# Patient Record
Sex: Female | Born: 1960 | Race: White | Hispanic: No | Smoking: Former smoker
Health system: Southern US, Community
[De-identification: ages and names within clinical notes are randomized; demographics above are authoritative.]

## PROBLEM LIST (undated history)

## (undated) DIAGNOSIS — D369 Benign neoplasm, unspecified site: Secondary | ICD-10-CM

## (undated) DIAGNOSIS — T7840XA Allergy, unspecified, initial encounter: Secondary | ICD-10-CM

## (undated) DIAGNOSIS — J449 Chronic obstructive pulmonary disease, unspecified: Secondary | ICD-10-CM

## (undated) DIAGNOSIS — J439 Emphysema, unspecified: Secondary | ICD-10-CM

## (undated) DIAGNOSIS — E042 Nontoxic multinodular goiter: Secondary | ICD-10-CM

## (undated) DIAGNOSIS — K635 Polyp of colon: Secondary | ICD-10-CM

## (undated) DIAGNOSIS — D329 Benign neoplasm of meninges, unspecified: Secondary | ICD-10-CM

## (undated) DIAGNOSIS — K219 Gastro-esophageal reflux disease without esophagitis: Secondary | ICD-10-CM

## (undated) DIAGNOSIS — R269 Unspecified abnormalities of gait and mobility: Secondary | ICD-10-CM

## (undated) DIAGNOSIS — E785 Hyperlipidemia, unspecified: Secondary | ICD-10-CM

## (undated) DIAGNOSIS — E0590 Thyrotoxicosis, unspecified without thyrotoxic crisis or storm: Secondary | ICD-10-CM

## (undated) DIAGNOSIS — H548 Legal blindness, as defined in USA: Secondary | ICD-10-CM

## (undated) DIAGNOSIS — I10 Essential (primary) hypertension: Secondary | ICD-10-CM

## (undated) DIAGNOSIS — E119 Type 2 diabetes mellitus without complications: Secondary | ICD-10-CM

## (undated) DIAGNOSIS — F17200 Nicotine dependence, unspecified, uncomplicated: Secondary | ICD-10-CM

## (undated) HISTORY — DX: Polyp of colon: K63.5

## (undated) HISTORY — DX: Unspecified abnormalities of gait and mobility: R26.9

## (undated) HISTORY — DX: Legal blindness, as defined in USA: H54.8

## (undated) HISTORY — DX: Chronic obstructive pulmonary disease, unspecified: J44.9

## (undated) HISTORY — DX: Gastro-esophageal reflux disease without esophagitis: K21.9

## (undated) HISTORY — DX: Nontoxic multinodular goiter: E04.2

## (undated) HISTORY — DX: Nicotine dependence, unspecified, uncomplicated: F17.200

## (undated) HISTORY — DX: Benign neoplasm of meninges, unspecified: D32.9

## (undated) HISTORY — DX: Emphysema, unspecified: J43.9

## (undated) HISTORY — DX: Benign neoplasm, unspecified site: D36.9

## (undated) HISTORY — DX: Thyrotoxicosis, unspecified without thyrotoxic crisis or storm: E05.90

## (undated) HISTORY — DX: Allergy, unspecified, initial encounter: T78.40XA

## (undated) HISTORY — DX: Type 2 diabetes mellitus without complications: E11.9

## (undated) HISTORY — DX: Hyperlipidemia, unspecified: E78.5

## (undated) HISTORY — DX: Essential (primary) hypertension: I10

---

## 1997-11-22 HISTORY — PX: ABDOMINAL HYSTERECTOMY: SHX81

## 1999-11-23 HISTORY — PX: INNER EAR SURGERY: SHX679

## 2001-04-18 ENCOUNTER — Ambulatory Visit (HOSPITAL_BASED_OUTPATIENT_CLINIC_OR_DEPARTMENT_OTHER): Admission: RE | Admit: 2001-04-18 | Discharge: 2001-04-19 | Payer: Self-pay | Admitting: *Deleted

## 2001-04-18 ENCOUNTER — Encounter (INDEPENDENT_AMBULATORY_CARE_PROVIDER_SITE_OTHER): Payer: Self-pay | Admitting: Specialist

## 2005-10-15 ENCOUNTER — Ambulatory Visit: Payer: Self-pay | Admitting: Internal Medicine

## 2005-11-22 HISTORY — PX: BREAST BIOPSY: SHX20

## 2006-04-06 ENCOUNTER — Ambulatory Visit: Payer: Self-pay | Admitting: Internal Medicine

## 2006-11-23 ENCOUNTER — Ambulatory Visit: Payer: Self-pay | Admitting: Internal Medicine

## 2007-01-16 ENCOUNTER — Ambulatory Visit: Payer: Self-pay | Admitting: Internal Medicine

## 2007-03-07 ENCOUNTER — Ambulatory Visit: Payer: Self-pay | Admitting: Internal Medicine

## 2007-11-23 DIAGNOSIS — D329 Benign neoplasm of meninges, unspecified: Secondary | ICD-10-CM

## 2007-11-23 DIAGNOSIS — H548 Legal blindness, as defined in USA: Secondary | ICD-10-CM

## 2007-11-23 HISTORY — DX: Legal blindness, as defined in USA: H54.8

## 2007-11-23 HISTORY — PX: BRAIN SURGERY: SHX531

## 2007-11-23 HISTORY — DX: Benign neoplasm of meninges, unspecified: D32.9

## 2008-03-14 ENCOUNTER — Ambulatory Visit: Payer: Self-pay | Admitting: Family Medicine

## 2009-01-01 ENCOUNTER — Ambulatory Visit: Payer: Self-pay | Admitting: Internal Medicine

## 2010-08-20 ENCOUNTER — Emergency Department (HOSPITAL_COMMUNITY): Admission: EM | Admit: 2010-08-20 | Discharge: 2010-08-20 | Payer: Self-pay | Admitting: Emergency Medicine

## 2011-02-04 LAB — DIFFERENTIAL
Basophils Absolute: 0 10*3/uL (ref 0.0–0.1)
Basophils Relative: 0 % (ref 0–1)
Eosinophils Absolute: 0.2 10*3/uL (ref 0.0–0.7)
Monocytes Absolute: 0.7 10*3/uL (ref 0.1–1.0)
Monocytes Relative: 7 % (ref 3–12)
Neutro Abs: 6.1 10*3/uL (ref 1.7–7.7)

## 2011-02-04 LAB — COMPREHENSIVE METABOLIC PANEL
ALT: 26 U/L (ref 0–35)
Albumin: 3.7 g/dL (ref 3.5–5.2)
Alkaline Phosphatase: 75 U/L (ref 39–117)
BUN: 8 mg/dL (ref 6–23)
Chloride: 107 mEq/L (ref 96–112)
Glucose, Bld: 101 mg/dL — ABNORMAL HIGH (ref 70–99)
Potassium: 3.2 mEq/L — ABNORMAL LOW (ref 3.5–5.1)
Sodium: 140 mEq/L (ref 135–145)
Total Bilirubin: 0.4 mg/dL (ref 0.3–1.2)

## 2011-02-04 LAB — POCT CARDIAC MARKERS
CKMB, poc: <1 ng/mL — ABNORMAL LOW (ref 1.0–8.0)
Myoglobin, poc: 32.1 ng/mL (ref 12–200)
Myoglobin, poc: 44.2 ng/mL (ref 12–200)
Troponin i, poc: <0.05 ng/mL (ref 0.00–0.09)

## 2011-02-04 LAB — CBC
HCT: 43.1 % (ref 36.0–46.0)
MCV: 90.5 fL (ref 78.0–100.0)
Platelets: 237 10*3/uL (ref 150–400)
RBC: 4.76 MIL/uL (ref 3.87–5.11)
WBC: 9.7 10*3/uL (ref 4.0–10.5)

## 2011-04-09 NOTE — Op Note (Signed)
[place]. [facility]  Patient:    Yolanda Gallegos, Yolanda Gallegos                          MRN: [id number] Proc. Date: 04/18/01 Adm. Date:  [id number] Attending:  Aundria Mems                           Operative Report  PREOPERATIVE DIAGNOSIS:  Chronic otosclerosis, right ear.  POSTOPERATIVE DIAGNOSIS:  Chronic otosclerosis, right ear.  OPERATION PERFORMED:  Stapedectomy, right ear with insertion of 5.0 mm fat wire prosthesis.  SURGEON:  Kathy Breach, M.D.  ANESTHESIA:  General orotracheal.  DESCRIPTION OF PROCEDURE:  With the patient under general orotracheal anesthesia, the right ear was prepped and draped in sterile fashion.  Canal skin was infiltrated with 1% Xylocaine with 1:100,000 epinephrine for vasoconstriction.  Examination of tympanic membrane revealed somewhat foreshortened retracted position of the long process of the malleus. Manipulation of the long process of the malleus revealed that it seemed to be normally mobile.  There was mild tympanosclerotic changes of the tympanic membrane which was a little bit on the atrophic side and bulging outwards slightly with positive pressure ventilation anesthesia.  Tympanotomy flap was incised and elevated entering the middle ear space.  There was prominent Henle spine and good overhang of the posterior superior canal wall.  The chorda tympany was dissected free and preserved as the posterior superior bony canal wall was curetted back to allow adequate visualization of the oval window area.  The facial nerve superior to the oval window area was inspected and bony and covered and normal appearance.  Manipulation of the long process of the malleus showed normal mobility of the incus long process but fixed stapes. There was an adhesion from the undersurface of the tympanic membrane to the lenticular process.  This was sharply excised.  The incudostapedial joint was separated and the stapedius tendon severed.  The  measurement from the foot plate to the incus was 5 mm.  The  anterior crus area was obliterated with heavy otosclerotic change.  The posterior two thirds of the foot plate barely visible, appeared to blue.  A stapes superstructure was then downfractured and carefully removed having a small segment of the posterior third of the foot plate coming with it.  The oval window was occluded with Gelfoam soaked with saline.  Stab incision was made in the posterior inferior margin of the earlobe, fat retrieved for construction of two 5 mm fat wire prostheses which was completed.  Attention was then returned to the ear.  Gelfoam pledget was removed with 45 degree third mm cross picks anterior portion of the foot plate was removed. The anterior one third was obliterated, was not readily removable and therefore, left in place.  with the posterior two thirds of the oval window opened, the 5 mm fat wire prosthesis was inserted in position, looped over the long process of the incus and crimped in place.  Manipulation of the long process of the malleus showed good mobility of the reconstructed ossicular chain.  Tympanotomy flap turned back into position and stabilized back in the external canal with Gelfoam soaked with saline.  Sterile cotton placed in the external meatus.  The patient tolerated the procedure well and was taken to the recovery room in stable general condition. DD:  04/18/01 TD:  04/18/01 Job: [id number]

## 2011-10-25 ENCOUNTER — Ambulatory Visit: Payer: Self-pay | Admitting: Family Medicine

## 2013-04-11 ENCOUNTER — Other Ambulatory Visit: Payer: Self-pay | Admitting: Family Medicine

## 2013-04-11 NOTE — Telephone Encounter (Signed)
Med rf °

## 2013-04-12 ENCOUNTER — Other Ambulatory Visit: Payer: Self-pay | Admitting: Family Medicine

## 2013-05-10 ENCOUNTER — Telehealth: Payer: Self-pay | Admitting: Family Medicine

## 2013-05-11 MED ORDER — LOSARTAN POTASSIUM 100 MG PO TABS: 100.0000 mg | ORAL_TABLET | Freq: Every day | ORAL | Status: DC

## 2013-05-11 NOTE — Telephone Encounter (Signed)
Rx Refilled  

## 2013-06-28 ENCOUNTER — Telehealth: Payer: Self-pay | Admitting: Family Medicine

## 2013-06-28 MED ORDER — HYDROCHLOROTHIAZIDE 25 MG PO TABS: 25.0000 mg | ORAL_TABLET | Freq: Every day | ORAL | Status: DC

## 2013-06-28 NOTE — Telephone Encounter (Signed)
Rx Refilled  

## 2013-07-13 ENCOUNTER — Encounter: Payer: Self-pay | Admitting: Family Medicine

## 2013-07-13 ENCOUNTER — Ambulatory Visit (INDEPENDENT_AMBULATORY_CARE_PROVIDER_SITE_OTHER): Payer: Medicare Other | Admitting: Family Medicine

## 2013-07-13 VITALS — BP 120/86 | HR 100 | Temp 98.3°F | Resp 20 | Wt 260.0 lb

## 2013-07-13 DIAGNOSIS — E785 Hyperlipidemia, unspecified: Secondary | ICD-10-CM | POA: Insufficient documentation

## 2013-07-13 DIAGNOSIS — I10 Essential (primary) hypertension: Secondary | ICD-10-CM | POA: Insufficient documentation

## 2013-07-13 DIAGNOSIS — J019 Acute sinusitis, unspecified: Secondary | ICD-10-CM

## 2013-07-13 DIAGNOSIS — F17200 Nicotine dependence, unspecified, uncomplicated: Secondary | ICD-10-CM | POA: Insufficient documentation

## 2013-07-13 DIAGNOSIS — J449 Chronic obstructive pulmonary disease, unspecified: Secondary | ICD-10-CM | POA: Insufficient documentation

## 2013-07-13 MED ORDER — FLUCONAZOLE 150 MG PO TABS: 150.0000 mg | ORAL_TABLET | Freq: Once | ORAL | Status: DC

## 2013-07-13 MED ORDER — AMOXICILLIN-POT CLAVULANATE 875-125 MG PO TABS: 1.0000 | ORAL_TABLET | Freq: Two times a day (BID) | ORAL | Status: DC

## 2013-07-13 NOTE — Progress Notes (Signed)
Subjective:    Patient ID: Yolanda Gallegos, female    DOB: [birth year], 52 y.o.   MRN: [id number]  HPI Patient reports 2 weeks of constant pain in the right greater than the left maxillary sinus, postnasal drip, sinus headache, congestion, and fevers. She also reports fullness and pain in both ears. She denies sore throat. She denies cough. She denies nausea or vomiting.  She also has hypertension and hyperlipidemia. Her blood pressures well controlled. She is overdue to check her cholesterol and she is fasting today. She denies chest pain, shortness of breath, dyspnea on exertion, myalgia, or right upper quadrant pain. Past Medical History  Diagnosis Date  . Hypertension   . Hyperlipidemia   . COPD (chronic obstructive pulmonary disease)   . Smoker   . Meningioma    Past Surgical History  Procedure Laterality Date  . 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 Abdominal hysterectomy      left oopherectomy   Current Outpatient Prescriptions on File Prior to Visit  Medication Sig Dispense Refill  . atorvastatin (LIPITOR) 40 MG tablet TAKE 1 TABLET BY MOUTH EVERY EVENING AT BEDTIME  30 tablet  3  . hydrochlorothiazide (HYDRODIURIL) 25 MG tablet Take 1 tablet (25 mg total) by mouth daily.  30 tablet  5  . losartan (COZAAR) 100 MG tablet Take 1 tablet (100 mg total) by mouth daily.  30 tablet  3  . omeprazole (PRILOSEC) 20 MG capsule TAKE 2 CAPSULES BY MOUTH DAILY  60 capsule  11   No current facility-administered medications on file prior to visit.   No Known Allergies History   Social History  . Marital Status: Divorced    Spouse Name: N/A    Number of Children: N/A  . Years of Education: N/A   Occupational History  . Not on file.   Social History Main Topics  . Smoking status: Former Games developer  . Smokeless tobacco: Former Neurosurgeon    Quit date: 01/21/2012  . Alcohol Use: No  . Drug Use: No  . Sexual Activity: Not on file   Other Topics Concern  . Not  on file   Social History Narrative  . No narrative on file      Review of Systems  All other systems reviewed and are negative.       Objective:   Physical Exam  Vitals reviewed. HENT:  Right Ear: External ear normal.  Left Ear: External ear normal.  Nose: Right sinus exhibits maxillary sinus tenderness. Right sinus exhibits no frontal sinus tenderness. Left sinus exhibits maxillary sinus tenderness. Left sinus exhibits no frontal sinus tenderness.  Mouth/Throat: Oropharynx is clear and moist. No oropharyngeal exudate.  Neck: Neck supple. No thyromegaly present.  Cardiovascular: Normal rate, regular rhythm, normal heart sounds and intact distal pulses.   No murmur heard. Pulmonary/Chest: Effort normal and breath sounds normal. No respiratory distress. She has no wheezes. She has no rales.  Abdominal: Soft. Bowel sounds are normal.  Lymphadenopathy:    She has no cervical adenopathy.          Assessment & Plan:  1. Acute rhinosinusitis - amoxicillin-clavulanate (AUGMENTIN) 875-125 MG per tablet; Take 1 tablet by mouth 2 (two) times daily.  Dispense: 20 tablet; Refill: 0  I also sent a prescription for Diflucan to her pharmacy in case she develops a secondary yeast infection.  2. HLD (hyperlipidemia) Blood pressures well controlled. Continue losartan and hydrochlorothiazide. Check fasting lipid panel. Goal  LDL is less than 100 given the fact she is a smoker. - COMPLETE METABOLIC PANEL WITH GFR - Lipid panel

## 2013-07-14 LAB — COMPLETE METABOLIC PANEL WITH GFR
ALT: 30 U/L (ref 0–35)
AST: 24 U/L (ref 0–37)
Albumin: 4.1 g/dL (ref 3.5–5.2)
Alkaline Phosphatase: 81 U/L (ref 39–117)
GFR, Est Non African American: >89 mL/min
Glucose, Bld: 90 mg/dL (ref 70–99)
Potassium: 3 mEq/L — ABNORMAL LOW (ref 3.5–5.3)
Sodium: 141 mEq/L (ref 135–145)
Total Bilirubin: 0.6 mg/dL (ref 0.3–1.2)
Total Protein: 7.1 g/dL (ref 6.0–8.3)

## 2013-07-14 LAB — LIPID PANEL
HDL: 40 mg/dL (ref >39)
LDL Cholesterol: 107 mg/dL — ABNORMAL HIGH (ref 0–99)
Total CHOL/HDL Ratio: 4.3 Ratio
VLDL: 25 mg/dL (ref 0–40)

## 2013-07-26 ENCOUNTER — Telehealth: Payer: Self-pay | Admitting: Family Medicine

## 2013-07-26 MED ORDER — HYDROCHLOROTHIAZIDE 25 MG PO TABS: 25.0000 mg | ORAL_TABLET | Freq: Every day | ORAL | Status: DC

## 2013-07-26 MED ORDER — LOSARTAN POTASSIUM 100 MG PO TABS: 100.0000 mg | ORAL_TABLET | Freq: Every day | ORAL | Status: DC

## 2013-07-26 MED ORDER — OMEPRAZOLE 20 MG PO CPDR: DELAYED_RELEASE_CAPSULE | ORAL | Status: DC

## 2013-07-26 MED ORDER — ATORVASTATIN CALCIUM 40 MG PO TABS: ORAL_TABLET | ORAL | Status: DC

## 2013-07-26 NOTE — Telephone Encounter (Signed)
HCTZ 25 mg tab 1 QD #90 Atorvastatin 40 mg tab 1 QHS #90 Losartan 100 mg tab 1 QD #90 Omeprazole DR 20 mg cap 2 QD #180 Fluticasone Prop 50 mcg spray (90 day supply)

## 2013-07-26 NOTE — Telephone Encounter (Signed)
Meds refilled.

## 2013-07-27 ENCOUNTER — Telehealth: Payer: Self-pay | Admitting: Family Medicine

## 2013-07-27 MED ORDER — LOSARTAN POTASSIUM 100 MG PO TABS: 100.0000 mg | ORAL_TABLET | Freq: Every day | ORAL | Status: DC

## 2013-07-27 NOTE — Telephone Encounter (Signed)
Rx Refilled  

## 2013-07-27 NOTE — Telephone Encounter (Signed)
Losartan Potassium 100 mg tab 1 QD #90

## 2013-09-27 ENCOUNTER — Other Ambulatory Visit: Payer: Self-pay

## 2013-09-28 ENCOUNTER — Encounter: Payer: Self-pay | Admitting: Family Medicine

## 2013-09-28 ENCOUNTER — Other Ambulatory Visit: Payer: Self-pay | Admitting: Family Medicine

## 2013-09-28 ENCOUNTER — Ambulatory Visit (INDEPENDENT_AMBULATORY_CARE_PROVIDER_SITE_OTHER): Payer: Medicare Other | Admitting: Family Medicine

## 2013-09-28 VITALS — BP 122/84 | HR 78 | Temp 97.6°F | Resp 18 | Ht 66.0 in | Wt 270.0 lb

## 2013-09-28 DIAGNOSIS — Z1231 Encounter for screening mammogram for malignant neoplasm of breast: Secondary | ICD-10-CM

## 2013-09-28 DIAGNOSIS — Z23 Encounter for immunization: Secondary | ICD-10-CM

## 2013-09-28 DIAGNOSIS — Z0000 Encounter for general adult medical examination without abnormal findings: Secondary | ICD-10-CM

## 2013-09-28 LAB — LIPID PANEL
HDL: 40 mg/dL (ref >39)
LDL Cholesterol: 102 mg/dL — ABNORMAL HIGH (ref 0–99)
Triglycerides: 173 mg/dL — ABNORMAL HIGH (ref <150)
VLDL: 35 mg/dL (ref 0–40)

## 2013-09-28 LAB — TSH: TSH: 0.063 u[IU]/mL — ABNORMAL LOW (ref 0.350–4.500)

## 2013-09-28 LAB — CBC WITH DIFFERENTIAL/PLATELET
Eosinophils Relative: 2 % (ref 0–5)
HCT: 40.4 % (ref 36.0–46.0)
Hemoglobin: 13.7 g/dL (ref 12.0–15.0)
Lymphocytes Relative: 38 % (ref 12–46)
MCHC: 33.9 g/dL (ref 30.0–36.0)
MCV: 86 fL (ref 78.0–100.0)
Monocytes Absolute: 0.7 10*3/uL (ref 0.1–1.0)
Monocytes Relative: 8 % (ref 3–12)
Neutro Abs: 4.3 10*3/uL (ref 1.7–7.7)

## 2013-09-28 LAB — COMPLETE METABOLIC PANEL WITH GFR
AST: 20 U/L (ref 0–37)
Albumin: 4.1 g/dL (ref 3.5–5.2)
Alkaline Phosphatase: 87 U/L (ref 39–117)
GFR, Est Non African American: >89 mL/min
Glucose, Bld: 111 mg/dL — ABNORMAL HIGH (ref 70–99)
Potassium: 4 mEq/L (ref 3.5–5.3)
Sodium: 142 mEq/L (ref 135–145)
Total Bilirubin: 0.6 mg/dL (ref 0.3–1.2)
Total Protein: 7.1 g/dL (ref 6.0–8.3)

## 2013-09-28 NOTE — Progress Notes (Signed)
Subjective:    Patient ID: Yolanda Gallegos, female    DOB: 03/03/1961, 52 y.o.   MRN: [id number]  HPI  The patient is here for complete physical exam. She quit smoking in 2013. Her last mammogram was in 2012. She is having Pneumovax 23 but is due for Prevnar 13. She is due for her flu shot. She is due for a colonoscopy. She hasn't history of hysterectomy and therefore does not require a Pap smear.  Was low this summer. She is taking hydrochlorothiazide for hypertension. Her blood pressures well controlled today. She reports a cough for last 2 weeks but is gradually improving. She denies any fevers or chills or chest pain or hemoptysis. Past Medical History  Diagnosis Date  . Hypertension   . Hyperlipidemia   . COPD (chronic obstructive pulmonary disease)   . Smoker   . Meningioma    Current Outpatient Prescriptions on File Prior to Visit  Medication Sig Dispense Refill  . atorvastatin (LIPITOR) 40 MG tablet TAKE 1 TABLET BY MOUTH EVERY EVENING AT BEDTIME  30 tablet  3  . hydrochlorothiazide (HYDRODIURIL) 25 MG tablet Take 1 tablet (25 mg total) by mouth daily.  30 tablet  5  . losartan (COZAAR) 100 MG tablet Take 1 tablet (100 mg total) by mouth daily.  90 tablet  1  . omeprazole (PRILOSEC) 20 MG capsule TAKE 2 CAPSULES BY MOUTH DAILY  60 capsule  11   No current facility-administered medications on file prior to visit.   No Known Allergies History   Social History  . Marital Status: Divorced    Spouse Name: N/A    Number of Children: N/A  . Years of Education: N/A   Occupational History  . Not on file.   Social History Main Topics  . Smoking status: Former Games developer  . Smokeless tobacco: Former Neurosurgeon    Quit date: 01/21/2012  . Alcohol Use: No  . Drug Use: No  . Sexual Activity: Not on file     Comment: married, son is battling leukemia.   Other Topics Concern  . Not on file   Social History Narrative  . No narrative on file   Family History  Problem Relation Age of Onset   . Hypertension Mother   . Cancer Mother     cervical  . Cancer Father     leukemia  . Hyperlipidemia Sister   . Heart disease Brother   . Hyperlipidemia Brother      Review of Systems  All other systems reviewed and are negative.       Objective:   Physical Exam  Vitals reviewed. Constitutional: She is oriented to person, place, and time. She appears well-developed and well-nourished. No distress.  HENT:  Head: Normocephalic and atraumatic.  Right Ear: External ear normal.  Left Ear: External ear normal.  Nose: Nose normal.  Mouth/Throat: Oropharynx is clear and moist. No oropharyngeal exudate.  Eyes: Conjunctivae and EOM are normal. Pupils are equal, round, and reactive to light. Right eye exhibits no discharge. Left eye exhibits no discharge. No scleral icterus.  Neck: Normal range of motion. Neck supple. No JVD present. No tracheal deviation present. No thyromegaly present.  Cardiovascular: Normal rate, regular rhythm, normal heart sounds and intact distal pulses.  Exam reveals no gallop and no friction rub.   No murmur heard. Pulmonary/Chest: Effort normal and breath sounds normal. No respiratory distress. She has no wheezes. She has no rales. She exhibits no tenderness.  Abdominal: Soft. Bowel sounds are  normal. She exhibits no distension and no mass. There is no tenderness. There is no rebound and no guarding.  Musculoskeletal: Normal range of motion. She exhibits no edema and no tenderness.  Lymphadenopathy:    She has no cervical adenopathy.  Neurological: She is alert and oriented to person, place, and time. She has normal reflexes. She displays normal reflexes. No cranial nerve deficit. She exhibits normal muscle tone. Coordination normal.  Skin: Skin is warm. No rash noted. She is not diaphoretic. No erythema. No pallor.  Psychiatric: She has a normal mood and affect. Her behavior is normal. Judgment and thought content normal.          Assessment & Plan:   1. Routine general medical examination at a health care facility Check a CMP, CBC, fasting lipid panel, and TSH. Potassium remains low, I would discontinue hydrochlorothiazide and switch the patient to amlodipine 10 mg by mouth daily. Also scheduled patient for mammogram as well as a colonoscopy. The remainder of her exam is normal. I recommended daily aerobic exercise. I will give the patient flu shot as well as a pneumonia vaccine. I anticipate the cough is due to bronchitis. It should improve over the next week. If it does not improve I would recommend a chest x-ray. - COMPLETE METABOLIC PANEL WITH GFR - CBC with Differential - Lipid panel - TSH - MM Digital Screening; Future - Ambulatory referral to Gastroenterology  2. Need for prophylactic vaccination and inoculation against unspecified single disease - Flu Vaccine QUAD 36+ mos IM - Pneumococcal conjugate vaccine 13-valent

## 2013-10-03 ENCOUNTER — Encounter: Payer: Self-pay | Admitting: Internal Medicine

## 2013-10-05 ENCOUNTER — Ambulatory Visit
Admission: RE | Admit: 2013-10-05 | Discharge: 2013-10-05 | Disposition: A | Discharge status: Home / Self Care | Payer: Medicare Other | Source: Ambulatory Visit | Attending: Family Medicine | Admitting: Family Medicine

## 2013-10-05 DIAGNOSIS — Z1231 Encounter for screening mammogram for malignant neoplasm of breast: Secondary | ICD-10-CM

## 2013-11-20 ENCOUNTER — Telehealth: Payer: Self-pay | Admitting: Family Medicine

## 2013-11-20 NOTE — Telephone Encounter (Signed)
Try lotrisone bid for 10 days.  NTBS if worse.

## 2013-11-20 NOTE — Telephone Encounter (Signed)
Pt has had a rash under breast,on belly, and creases on her legs she has had this for couple of months and it is red and itchy she said she is wanting to know if something can be called in for it  Pharmacy [facility]  Call back number is [phone]

## 2013-11-21 ENCOUNTER — Other Ambulatory Visit: Payer: Self-pay | Admitting: Family Medicine

## 2013-11-21 MED ORDER — CLOTRIMAZOLE-BETAMETHASONE 1-0.05 % EX CREA: 1.0000 "application " | TOPICAL_CREAM | Freq: Two times a day (BID) | CUTANEOUS | Status: DC

## 2013-11-21 NOTE — Telephone Encounter (Signed)
Patient aware and med sent to pharm 

## 2013-11-22 DIAGNOSIS — D369 Benign neoplasm, unspecified site: Secondary | ICD-10-CM

## 2013-11-22 DIAGNOSIS — K635 Polyp of colon: Secondary | ICD-10-CM

## 2013-11-22 HISTORY — DX: Polyp of colon: K63.5

## 2013-11-22 HISTORY — DX: Benign neoplasm, unspecified site: D36.9

## 2013-11-30 ENCOUNTER — Ambulatory Visit (AMBULATORY_SURGERY_CENTER): Payer: Medicare Other | Admitting: *Deleted

## 2013-11-30 VITALS — Ht 66.0 in | Wt 267.6 lb

## 2013-11-30 DIAGNOSIS — Z1211 Encounter for screening for malignant neoplasm of colon: Secondary | ICD-10-CM

## 2013-11-30 MED ORDER — MOVIPREP 100 G PO SOLR: ORAL | Status: DC

## 2013-11-30 NOTE — Progress Notes (Signed)
No allergies to eggs or soy. No problems with anesthesia.  

## 2013-12-04 ENCOUNTER — Encounter: Payer: Self-pay | Admitting: Internal Medicine

## 2013-12-06 ENCOUNTER — Telehealth: Payer: Self-pay | Admitting: Internal Medicine

## 2013-12-06 NOTE — Telephone Encounter (Signed)
Spoke with Yolanda Gallegos and states pharmacy will fill if we call in a formulary.  I told her that Moviprep is the only prep that Dr. Hilarie Fredrickson like to use and prep voucher mailed to Yolanda Gallegos

## 2013-12-10 ENCOUNTER — Telehealth: Payer: Self-pay | Admitting: Gastroenterology

## 2013-12-12 ENCOUNTER — Other Ambulatory Visit: Payer: Self-pay | Admitting: Family Medicine

## 2013-12-12 ENCOUNTER — Telehealth: Payer: Self-pay | Admitting: Internal Medicine

## 2013-12-12 MED ORDER — OMEPRAZOLE 40 MG PO CPDR: 40.0000 mg | DELAYED_RELEASE_CAPSULE | Freq: Every day | ORAL | Status: DC

## 2013-12-12 NOTE — Telephone Encounter (Signed)
Called pharmacy gave them free voucher info over the phone. It was accepted. Called pt to tell her. Pt verbalized understanding

## 2013-12-14 ENCOUNTER — Ambulatory Visit (AMBULATORY_SURGERY_CENTER): Payer: Medicare Other | Admitting: Internal Medicine

## 2013-12-14 ENCOUNTER — Encounter: Payer: Self-pay | Admitting: Internal Medicine

## 2013-12-14 VITALS — BP 130/83 | HR 90 | Temp 98.4°F | Resp 19 | Ht 66.0 in | Wt 267.0 lb

## 2013-12-14 DIAGNOSIS — Z1211 Encounter for screening for malignant neoplasm of colon: Secondary | ICD-10-CM

## 2013-12-14 DIAGNOSIS — D126 Benign neoplasm of colon, unspecified: Secondary | ICD-10-CM

## 2013-12-14 MED ORDER — SODIUM CHLORIDE 0.9 % IV SOLN: 500.0000 mL | INTRAVENOUS | Status: DC

## 2013-12-14 NOTE — Patient Instructions (Signed)

## 2013-12-14 NOTE — Progress Notes (Signed)
Procedure ends, to recovery, report given and VSS. 

## 2013-12-14 NOTE — Op Note (Signed)
[place]  [address]. [place], [id number]   COLONOSCOPY PROCEDURE REPORT  PATIENT: Yolanda Gallegos, Yolanda Gallegos  MR#: [id number] BIRTHDATE: 09-Jul-1961 , 52  yrs. old GENDER: Female ENDOSCOPIST: Jerene Bears, MD REFERRED VO:[id number] Dennard Schaumann, M.D. PROCEDURE DATE:  12/14/2013 PROCEDURE:   Submucosal injection, any substance and Colonoscopy with snare polypectomy First Screening Colonoscopy - Avg.  risk and is 50 yrs.  old or older Yes.  Prior Negative Screening - Now for repeat screening. N/A  History of Adenoma - Now for follow-up colonoscopy & has been > or = to 3 yrs.  N/A  Polyps Removed Today? Yes. ASA CLASS:   Class III INDICATIONS:average risk screening and first colonoscopy. MEDICATIONS: MAC sedation, administered by CRNA, Propofol (Diprivan), and Propofol (Diprivan) 1100 mg IV  DESCRIPTION OF PROCEDURE:   After the risks benefits and alternatives of the procedure were thoroughly explained, informed consent was obtained.  A digital rectal exam revealed external hemorrhoids.   The LB [id number]  endoscope was introduced through the anus and advanced to the cecum, which was identified by the ileocecal valve. No adverse events experienced.   The quality of the prep was good, using MoviPrep  The instrument was then slowly withdrawn as the colon was fully examined.   COLON FINDINGS: The colon was redundant and despite abdominal counter pressure, position change, the cecum was unable to be reached and thus was not visualized.   A flat polyp measuring 20 mm in size was found in the transverse colon.  Endoscopic mucosal resection was performed by injecting saline (3 cc) into the submucosa to raise the lesion and polypectomy was performed with snare cautery.  The polyp lifted well after submucosal injection The polyp lifted well after submucosal injection.  The resection was complete and the polyp tissue was completely retrieved.  There was no 'target sign' at EMR site.   There was no blood loss from polypectomy.   Five sessile polyps measuring 3-6 mm in size were found in the ascending colon, at the hepatic flexure, and splenic flexure.  Polypectomy was performed using cold snare.  All resections were complete and all polyp tissue was completely retrieved.   Three sessile polyps measuring 4-6 mm in size were found in the sigmoid colon, rectosigmoid colon, and rectum. Polypectomy was performed using cold snare.  All resections were complete and all polyp tissue was completely retrieved. Retroflexed views revealed no abnormalities. The time to cecum=17 minutes 13 seconds.  Withdrawal time=22 minutes 00 seconds.  The scope was withdrawn and the procedure completed. COMPLICATIONS: There were no complications.  ENDOSCOPIC IMPRESSION: 1.   The colon was redundant; incomplete exam.  Cecum not visualized 2.   Flat polyp measuring 20 mm in size was found in the transverse colon; endoscopic mucosal resection was performed 3.   Five sessile polyps measuring 3-6 mm in size were found in the ascending colon, at the hepatic flexure, and splenic flexure; Polypectomy was performed using cold snare 4.   Three sessile polyps measuring 4-6 mm in size were found in the sigmoid colon, rectosigmoid colon, and rectum; Polypectomy was performed using cold snare  RECOMMENDATIONS: 1.  Hold aspirin, aspirin products, and anti-inflammatory medication for 2 weeks. 2.  Await pathology results 3.  Office for follow-up to discuss repeat exam vs. another screening modality to visualize cecum 4.  You will receive a letter within 1-2 weeks with the results of your biopsy as well as final recommendations.  Please call my office if you have  not received a letter after 3 weeks.   eSigned:  Jerene Bears, MD 12/14/2013 12:59 PM   cc: The Patient and Jenna Luo, MD   PATIENT NAME:  Yolanda Gallegos, Yolanda Gallegos MR#: [id number]

## 2013-12-14 NOTE — Progress Notes (Signed)
Called to room to assist during endoscopic procedure.  Patient ID and intended procedure confirmed with present staff. Received instructions for my participation in the procedure from the performing physician.  

## 2013-12-14 NOTE — Progress Notes (Signed)
Report given to Randall Hiss, RN to resume care. Maw

## 2013-12-17 ENCOUNTER — Telehealth: Payer: Self-pay | Admitting: *Deleted

## 2013-12-17 NOTE — Telephone Encounter (Signed)
  Follow up Call-  Call back number 12/14/2013  Post procedure Call Back phone  # [phone]  Permission to leave phone message Yes     Patient questions:  Do you have a fever, pain , or abdominal swelling? no Pain Score  0 *  Have you tolerated food without any problems? yes  Have you been able to return to your normal activities? yes  Do you have any questions about your discharge instructions: Diet   no Medications  no Follow up visit  no  Do you have questions or concerns about your Care? no  Actions: * If pain score is 4 or above: No action needed, pain <4.

## 2013-12-18 ENCOUNTER — Encounter: Payer: Self-pay | Admitting: Internal Medicine

## 2013-12-21 ENCOUNTER — Other Ambulatory Visit: Payer: Self-pay | Admitting: *Deleted

## 2014-01-22 ENCOUNTER — Ambulatory Visit: Payer: Medicare Other | Admitting: Internal Medicine

## 2014-02-01 ENCOUNTER — Encounter: Payer: Self-pay | Admitting: Family Medicine

## 2014-02-01 ENCOUNTER — Other Ambulatory Visit: Payer: Self-pay | Admitting: Family Medicine

## 2014-02-01 ENCOUNTER — Ambulatory Visit (INDEPENDENT_AMBULATORY_CARE_PROVIDER_SITE_OTHER): Payer: Medicare Other | Admitting: Family Medicine

## 2014-02-01 VITALS — BP 132/80 | HR 86 | Temp 97.0°F | Resp 20 | Ht 66.0 in | Wt 262.0 lb

## 2014-02-01 DIAGNOSIS — R7309 Other abnormal glucose: Secondary | ICD-10-CM

## 2014-02-01 DIAGNOSIS — H9202 Otalgia, left ear: Secondary | ICD-10-CM

## 2014-02-01 DIAGNOSIS — E0590 Thyrotoxicosis, unspecified without thyrotoxic crisis or storm: Secondary | ICD-10-CM

## 2014-02-01 DIAGNOSIS — H9209 Otalgia, unspecified ear: Secondary | ICD-10-CM

## 2014-02-01 DIAGNOSIS — R739 Hyperglycemia, unspecified: Secondary | ICD-10-CM

## 2014-02-01 DIAGNOSIS — E785 Hyperlipidemia, unspecified: Secondary | ICD-10-CM

## 2014-02-01 DIAGNOSIS — Z23 Encounter for immunization: Secondary | ICD-10-CM

## 2014-02-01 LAB — COMPLETE METABOLIC PANEL WITH GFR
ALK PHOS: 83 U/L (ref 39–117)
ALT: 25 U/L (ref 0–35)
AST: 18 U/L (ref 0–37)
Albumin: 4.2 g/dL (ref 3.5–5.2)
BUN: 10 mg/dL (ref 6–23)
CO2: 34 mEq/L — ABNORMAL HIGH (ref 19–32)
CREATININE: 0.54 mg/dL (ref 0.50–1.10)
Calcium: 9.8 mg/dL (ref 8.4–10.5)
Chloride: 100 mEq/L (ref 96–112)
GFR, Est African American: >89 mL/min
GFR, Est Non African American: >89 mL/min
Glucose, Bld: 109 mg/dL — ABNORMAL HIGH (ref 70–99)
POTASSIUM: 3.2 meq/L — AB (ref 3.5–5.3)
Sodium: 142 mEq/L (ref 135–145)
Total Bilirubin: 0.7 mg/dL (ref 0.2–1.2)
Total Protein: 7.1 g/dL (ref 6.0–8.3)

## 2014-02-01 LAB — LIPID PANEL
CHOL/HDL RATIO: 3.9 ratio
CHOLESTEROL: 157 mg/dL (ref 0–200)
HDL: 40 mg/dL (ref >39)
LDL Cholesterol: 93 mg/dL (ref 0–99)
TRIGLYCERIDES: 121 mg/dL (ref <150)
VLDL: 24 mg/dL (ref 0–40)

## 2014-02-01 LAB — TSH: TSH: 0.02 u[IU]/mL — ABNORMAL LOW (ref 0.350–4.500)

## 2014-02-01 LAB — HEMOGLOBIN A1C
Hgb A1c MFr Bld: 6.4 % — ABNORMAL HIGH (ref <5.7)
Mean Plasma Glucose: 137 mg/dL — ABNORMAL HIGH (ref <117)

## 2014-02-01 NOTE — Progress Notes (Signed)
Subjective:    Patient ID: Yolanda Gallegos, female    DOB: [birth year], 53 y.o.   MRN: [id number]  HPI Patient was last seen in November. At that time she was found to have an elevated fasting blood sugar of 111. Since that time she has decreased her carbohydrate consumption. She has also lost 8 pounds by trying to exercise more. She is here to recheck a fasting blood sugar along with hemoglobin A1c. She is also due to check a fasting lipid panel. She has a strong family history cardiovascular disease with both her mother and brother having strokes. She successfully continues to refrain from smoking. She has not smoked a cigarette and now 2 years. I am very proud of the patient for that.  Her blood pressure is well-controlled at 132/80. She denies any chest pain shortness of breath or dyspnea on exertion. She does have pain in her right ear. She has a history of a "tumor" removed from the left ear by Dr. Truman Hayward. It sounds that she may have had a cholesteatoma. On examination today she has a bulging left tympanic membrane which appears to be a mass in the upper right quadrant. Her left tympanic membrane which is hurting only appears to have scar tissue. Past Medical History  Diagnosis Date  . Hypertension   . Hyperlipidemia   . COPD (chronic obstructive pulmonary disease)   . Smoker   . Meningioma   . GERD (gastroesophageal reflux disease)   . Allergy    Current Outpatient Prescriptions on File Prior to Visit  Medication Sig Dispense Refill  . atorvastatin (LIPITOR) 40 MG tablet TAKE 1 TABLET BY MOUTH EVERY EVENING AT BEDTIME  30 tablet  3  . fluticasone (FLONASE) 50 MCG/ACT nasal spray Place 2 sprays into both nostrils daily.      . hydrochlorothiazide (HYDRODIURIL) 25 MG tablet Take 1 tablet (25 mg total) by mouth daily.  30 tablet  5  . ibuprofen (ADVIL,MOTRIN) 200 MG tablet Take 200 mg by mouth every 6 (six) hours as needed.      . loratadine (CLARITIN) 10 MG tablet Take 10 mg by mouth daily.        Marland Kitchen losartan (COZAAR) 100 MG tablet Take 1 tablet (100 mg total) by mouth daily.  90 tablet  1  . omeprazole (PRILOSEC) 40 MG capsule Take 1 capsule (40 mg total) by mouth daily.  30 capsule  11   No current facility-administered medications on file prior to visit.   Allergies  Allergen Reactions  . Doxycycline Hives and Shortness Of Breath  . Sulfa Antibiotics     Per positive allergy test   History   Social History  . Marital Status: Divorced    Spouse Name: N/A    Number of Children: N/A  . Years of Education: N/A   Occupational History  . Not on file.   Social History Main Topics  . Smoking status: Former Smoker    Quit date: 01/21/2012  . Smokeless tobacco: Never Used  . Alcohol Use: No  . Drug Use: No  . Sexual Activity: Not on file     Comment: married, son is battling leukemia.   Other Topics Concern  . Not on file   Social History Narrative  . No narrative on file      Review of Systems  All other systems reviewed and are negative.       Objective:   Physical Exam  Vitals reviewed. HENT:  Right Ear: Tympanic membrane  is not bulging.  Left Ear: Tympanic membrane is bulging.  Ears:  Cardiovascular: Normal rate, regular rhythm and normal heart sounds.   No murmur heard. Pulmonary/Chest: Effort normal and breath sounds normal. No respiratory distress. She has no wheezes. She has no rales.  Abdominal: Soft. Bowel sounds are normal. She exhibits no distension. There is no tenderness. There is no rebound and no guarding.   I appreciate a mass in the area outlined on the left tympanic membrane        Assessment & Plan:  1. Hyperglycemia I will recheck a fasting blood sugar as well as a hemoglobin A1c. I congratulated the patient on her dietary changes and weight loss. I continue to encourage her to continue aerobic exercise and further weight loss - COMPLETE METABOLIC PANEL WITH GFR - Hemoglobin A1c  2. Hyperthyroidism Patient's TSH was  borderline low in November with normal T3 and T4 levels. I will repeat a TSH today. If the patient has developed overt hypothyroidism, the next step would be obtaining a 24-hour radionucleotide uptake scan - TSH  3. HLD (hyperlipidemia) Check fasting lipid panel. The LDL is less than 130. - COMPLETE METABOLIC PANEL WITH GFR - Lipid panel  4. Otalgia of left ear I am concerned patient may be developing a cholesteatoma in the left ear. Consult ENT for evaluation. - Ambulatory referral to ENT

## 2014-02-03 ENCOUNTER — Other Ambulatory Visit: Payer: Self-pay | Admitting: Family Medicine

## 2014-02-04 LAB — T3, FREE: T3, Free: 3.6 pg/mL (ref 2.3–4.2)

## 2014-02-04 LAB — T4, FREE: Free T4: 1.02 ng/dL (ref 0.80–1.80)

## 2014-02-06 ENCOUNTER — Encounter: Payer: Self-pay | Admitting: Internal Medicine

## 2014-02-06 ENCOUNTER — Other Ambulatory Visit: Payer: Self-pay | Admitting: Family Medicine

## 2014-02-06 DIAGNOSIS — E785 Hyperlipidemia, unspecified: Secondary | ICD-10-CM

## 2014-02-06 DIAGNOSIS — I10 Essential (primary) hypertension: Secondary | ICD-10-CM

## 2014-02-06 DIAGNOSIS — R7989 Other specified abnormal findings of blood chemistry: Secondary | ICD-10-CM

## 2014-02-06 DIAGNOSIS — Z79899 Other long term (current) drug therapy: Secondary | ICD-10-CM

## 2014-02-06 DIAGNOSIS — E0590 Thyrotoxicosis, unspecified without thyrotoxic crisis or storm: Secondary | ICD-10-CM

## 2014-02-06 MED ORDER — POTASSIUM CHLORIDE CRYS ER 20 MEQ PO TBCR: 20.0000 meq | EXTENDED_RELEASE_TABLET | Freq: Every day | ORAL | Status: DC

## 2014-02-08 ENCOUNTER — Telehealth: Payer: Self-pay | Admitting: Family Medicine

## 2014-02-08 ENCOUNTER — Other Ambulatory Visit: Payer: Self-pay | Admitting: Family Medicine

## 2014-02-08 MED ORDER — HYDROCHLOROTHIAZIDE 25 MG PO TABS: 25.0000 mg | ORAL_TABLET | Freq: Every day | ORAL | Status: DC

## 2014-02-08 NOTE — Telephone Encounter (Signed)
Spoke to pt and explained that we have not received any refill request from the [facility] but will be glad to refill it. Rx Refilled.

## 2014-02-08 NOTE — Telephone Encounter (Signed)
Message copied by Alyson Locket on Fri Feb 08, 2014  3:51 PM ------      Message from: Lenore Manner      Created: Fri Feb 08, 2014  1:51 PM      Regarding: RX      Contact: [phone]       PT went to pharmacy to pick up her fluid pill and they told her we denied it and the pt is wanting to know why? ------

## 2014-02-08 NOTE — Telephone Encounter (Signed)
LMTRC

## 2014-02-08 NOTE — Telephone Encounter (Signed)
Refill appropriate and filled per protocol. 

## 2014-02-11 ENCOUNTER — Encounter: Payer: Self-pay | Admitting: Internal Medicine

## 2014-02-11 ENCOUNTER — Ambulatory Visit (INDEPENDENT_AMBULATORY_CARE_PROVIDER_SITE_OTHER): Payer: Medicare Other | Admitting: Internal Medicine

## 2014-02-11 VITALS — BP 122/84 | HR 80 | Ht 66.0 in | Wt 264.2 lb

## 2014-02-11 DIAGNOSIS — D126 Benign neoplasm of colon, unspecified: Secondary | ICD-10-CM

## 2014-02-11 MED ORDER — HYDROCORTISONE ACE-PRAMOXINE 1-1 % RE CREA: 1.0000 "application " | TOPICAL_CREAM | Freq: Two times a day (BID) | RECTAL | Status: DC

## 2014-02-11 NOTE — Patient Instructions (Signed)
Dr. Hilarie Fredrickson recommends you have a repeat colonoscopy for polyp surveillance in December 2015. We will send you a reminder in the mail.                                               We are excited to introduce MyChart, a new best-in-class service that provides you online access to important information in your electronic medical record. We want to make it easier for you to view your health information - all in one secure location - when and where you need it. We expect MyChart will enhance the quality of care and service we provide.  When you register for MyChart, you can:    View your test results.    Request appointments and receive appointment reminders via email.    Request medication renewals.    View your medical history, allergies, medications and immunizations.    Communicate with your physician's office through a password-protected site.    Conveniently print information such as your medication lists.  To find out if MyChart is right for you, please talk to a member of our clinical staff today. We will gladly answer your questions about this free health and wellness tool.  If you are age 18 or older and want a member of your family to have access to your record, you must provide written consent by completing a proxy form available at our office. Please speak to our clinical staff about guidelines regarding accounts for patients younger than age 63.  As you activate your MyChart account and need any technical assistance, please call the MyChart technical support line at [phone]) or email your question to [email or web address]. If you email your question(s), please include your name, a return phone number and the best time to reach you.  If you have non-urgent health-related questions, you can send a message to our office through [facility] at [email or web address]. If you have a medical emergency, call 911.  Thank you for using MyChart as your new health and  wellness resource!   MyChart licensed from [employer],  1999-2010. Patents Pending.

## 2014-02-11 NOTE — Progress Notes (Signed)
Patient ID: Yolanda Gallegos, female   DOB: [birth year], 53 y.o.   MRN: [id number] HPI: Yolanda Gallegos is 53 yo female with a past medical history of hypertension, hyperlipidemia, tobacco use, and now tubular adenoma who is seen in followup after screening colonoscopy. She came for direct, screening, colonoscopy on 12/16/2013. This exam revealed a redundant colon and the scope could only be advanced to the ileocecal valve. The cecal base was not examined. A 20 mm sessile serrated adenoma was removed after submucosal injection from the transverse colon. 8 other polyps were also removed which were found to be adenoma without high-grade dysplasia and hyperplastic polyps. Today she reports she is feeling well. She is having some intermittent pain from external hemorrhoids but not bleeding. She has used Preparation H with some benefit. She denies abdominal pain and reports she did well after the colonoscopy. Good appetite. No nausea or vomiting. No trouble with frequent heartburn, dysphagia or odynophagia. No diarrhea or constipation. She denies a family history of colon polyps or cancers. She does smoke  Past Medical History  Diagnosis Date  . Hypertension   . Hyperlipidemia   . COPD (chronic obstructive pulmonary disease)   . Smoker   . Meningioma   . GERD (gastroesophageal reflux disease)   . Allergy   . Tubular adenoma 2015  . Colon polyps 2015    Past Surgical History  Procedure Laterality Date  . 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 Abdominal hysterectomy  1999    left oopherectomy  . Breast biopsy Left 2007    benign  . Inner ear surgery Right 2001  . Cesarean section  1982, 1985, 1987    Current Outpatient Prescriptions  Medication Sig Dispense Refill  . acetaminophen (TYLENOL) 500 MG tablet Take 500 mg by mouth every 6 (six) hours as needed.      Marland Kitchen atorvastatin (LIPITOR) 40 MG tablet TAKE 1 TABLET BY MOUTH EVERY EVENING AT BEDTIME  30 tablet  3  .  clotrimazole-betamethasone (LOTRISONE) cream       . fluticasone (FLONASE) 50 MCG/ACT nasal spray SPRAY TWICE IN EACH NOSTRIL EVERY DAY  16 g  0  . hydrochlorothiazide (HYDRODIURIL) 25 MG tablet Take 1 tablet (25 mg total) by mouth daily.  30 tablet  5  . ibuprofen (ADVIL,MOTRIN) 200 MG tablet Take 200 mg by mouth every 6 (six) hours as needed.      . loratadine (CLARITIN) 10 MG tablet Take 10 mg by mouth daily.      Marland Kitchen losartan (COZAAR) 100 MG tablet TAKE 1 TABLET EVERY DAY  90 tablet  4  . omeprazole (PRILOSEC) 40 MG capsule Take 1 capsule (40 mg total) by mouth daily.  30 capsule  11  . potassium chloride SA (K-DUR,KLOR-CON) 20 MEQ tablet Take 1 tablet (20 mEq total) by mouth daily.  30 tablet  3  . triamcinolone ointment (KENALOG) 0.1 % APPLY TWICE A DAY FOR 10 DAYS PRN Rash      . pramoxine-hydrocortisone (ANALPRAM-HC) 1-1 % rectal cream Place 1 application rectally 2 (two) times daily.  30 g  0   No current facility-administered medications for this visit.    Allergies  Allergen Reactions  . Doxycycline Hives and Shortness Of Breath  . Sulfa Antibiotics     Per positive allergy test    Family History  Problem Relation Age of Onset  . Hypertension Mother   . Cancer Mother     cervical  .  Cancer Father     leukemia  . Hyperlipidemia Sister   . Heart disease Brother   . Hyperlipidemia Brother   . Colon cancer Neg Hx     History  Substance Use Topics  . Smoking status: Former Smoker    Quit date: 01/21/2012  . Smokeless tobacco: Never Used  . Alcohol Use: No    ROS: As per history of present illness, otherwise negative  BP 122/84  Pulse 80  Ht _0  (1.676 m)  Wt 264 lb 3.2 oz (119.84 kg)  BMI 42.66 kg/m2 Constitutional: Well-developed and well-nourished. No distress. HEENT: Normocephalic and atraumatic. No scleral icterus. Cardiovascular: Normal rate, regular rhythm and intact distal pulses. Pulmonary/chest: Effort normal and breath sounds normal. No wheezing,  rales or rhonchi. Abdominal: Soft, nontender, nondistended. Bowel sounds active throughout.  Extremities: no clubbing, cyanosis, or edema Neurological: Alert and oriented to person place and time. Psychiatric: Normal mood and affect. Behavior is normal.  RELEVANT LABS AND IMAGING: CBC    Component Value Date/Time   WBC 8.2 09/28/2013 1050   RBC 4.70 09/28/2013 1050   HGB 13.7 09/28/2013 1050   HCT 40.4 09/28/2013 1050   PLT 287 09/28/2013 1050   MCV 86.0 09/28/2013 1050   MCH 29.1 09/28/2013 1050   MCHC 33.9 09/28/2013 1050   RDW 13.5 09/28/2013 1050   LYMPHSABS 3.1 09/28/2013 1050   MONOABS 0.7 09/28/2013 1050   EOSABS 0.2 09/28/2013 1050   BASOSABS 0.0 09/28/2013 1050    CMP     Component Value Date/Time   NA 142 02/01/2014 1142   K 3.2* 02/01/2014 1142   CL 100 02/01/2014 1142   CO2 34* 02/01/2014 1142   GLUCOSE 109* 02/01/2014 1142   BUN 10 02/01/2014 1142   CREATININE 0.54 02/01/2014 1142   CREATININE 0.55 08/20/2010 1703   CALCIUM 9.8 02/01/2014 1142   PROT 7.1 02/01/2014 1142   ALBUMIN 4.2 02/01/2014 1142   AST 18 02/01/2014 1142   ALT 25 02/01/2014 1142   ALKPHOS 83 02/01/2014 1142   BILITOT 0.7 02/01/2014 1142   GFRNONAA >60 08/20/2010 1703   GFRAA  Value: >60        The eGFR has been calculated using the MDRD equation. This calculation has not been validated in all clinical situations. eGFR's persistently <60 mL/min signify possible Chronic Kidney Disease. 08/20/2010 1703    ASSESSMENT/PLAN: Yolanda Gallegos is 53 yo female with a past medical history of hypertension, hyperlipidemia, tobacco use, and now tubular adenoma who is seen in followup after screening colonoscopy.   1.  Multiple adenomatous colon polyps/sessile serrated adenoma -- we discussed her colonoscopy in path findings together. She had multiple tubular adenomas, the largest 20 mm. By guidelines, repeat surveillance colonoscopy would be recommended in 3 years, however given the fact that the cecum was not completely visualized,  I recommended performing a repeat colonoscopy sooner. We discussed the possibility of again not being able to visualize the cecum, and if this is the case I would recommend CT colonoscopy or barium enema. She understands this recommendation and would prefer repeat colonoscopy for the next screening/surveillance over another modality. I recommended repeat in 6-12 months and after this discussion we will plan repeat colonoscopy in December 2015 or January 2016. We discussed the procedure to be clear risks and benefits and she is agreeable to proceed. --I did recommend that her children begin screening colonoscopy immediately at age 24  2.  External hemorrhoids -- seen at colonoscopy. Analpram 3 times daily  for a week. Asked that she call me if symptoms fail to improve. She voices understanding  3.  Tobacco abuse -- cessation recommended. This is a risk factor for adenomatous colon polyps.

## 2014-02-13 ENCOUNTER — Telehealth: Payer: Self-pay | Admitting: Gastroenterology

## 2014-02-13 NOTE — Telephone Encounter (Signed)
Started prior authorization for analpram faxed for to [phone]

## 2014-02-15 ENCOUNTER — Telehealth: Payer: Self-pay | Admitting: Internal Medicine

## 2014-02-15 NOTE — Telephone Encounter (Signed)
lvm for pt to call me back 

## 2014-02-19 NOTE — Telephone Encounter (Signed)
lvm for pt to call me back regarding prior auth for analpram

## 2014-02-26 ENCOUNTER — Other Ambulatory Visit: Payer: Self-pay | Admitting: Gastroenterology

## 2014-02-28 ENCOUNTER — Ambulatory Visit (HOSPITAL_COMMUNITY): Payer: Medicare Other

## 2014-02-28 ENCOUNTER — Encounter (HOSPITAL_COMMUNITY): Payer: Self-pay

## 2014-02-28 ENCOUNTER — Encounter (HOSPITAL_COMMUNITY)
Admission: RE | Admit: 2014-02-28 | Discharge: 2014-02-28 | Disposition: A | Discharge status: Home / Self Care | Payer: Medicare Other | Source: Ambulatory Visit | Attending: Family Medicine | Admitting: Family Medicine

## 2014-02-28 DIAGNOSIS — E0590 Thyrotoxicosis, unspecified without thyrotoxic crisis or storm: Secondary | ICD-10-CM

## 2014-02-28 MED ORDER — SODIUM IODIDE I 131 CAPSULE
14.0000 | Freq: Once | INTRAVENOUS | Status: AC | PRN
  Administered 2014-02-28: 14 via ORAL

## 2014-03-01 ENCOUNTER — Encounter (HOSPITAL_COMMUNITY)
Admission: RE | Admit: 2014-03-01 | Discharge: 2014-03-01 | Disposition: A | Discharge status: Home / Self Care | Payer: Medicare Other | Source: Ambulatory Visit | Attending: Family Medicine | Admitting: Family Medicine

## 2014-03-01 ENCOUNTER — Encounter (HOSPITAL_COMMUNITY): Payer: Medicare Other

## 2014-03-01 MED ORDER — SODIUM PERTECHNETATE TC 99M INJECTION
10.0000 | Freq: Once | INTRAVENOUS | Status: AC | PRN
  Administered 2014-03-01: 10 via INTRAVENOUS

## 2014-03-05 ENCOUNTER — Telehealth: Payer: Self-pay | Admitting: Gastroenterology

## 2014-03-05 ENCOUNTER — Encounter: Payer: Self-pay | Admitting: Family Medicine

## 2014-03-05 DIAGNOSIS — E042 Nontoxic multinodular goiter: Secondary | ICD-10-CM | POA: Insufficient documentation

## 2014-03-05 MED ORDER — HYDROCORTISONE 2.5 % RE CREA: 1.0000 "application " | TOPICAL_CREAM | Freq: Two times a day (BID) | RECTAL | Status: DC

## 2014-03-05 NOTE — Telephone Encounter (Signed)
Message copied by Annabell Sabal on Tue Mar 05, 2014  8:18 AM ------      Message from: Jerene Bears      Created: Fri Mar 01, 2014  4:10 PM       Trial of anusol HC cream BID x 5-7 days      ----- Message -----         From: Annabell Sabal, CMA         Sent: 02/26/2014   2:39 PM           To: Jerene Bears, MD            Did a prior authorization for Analpram, it was denied. Is there something else I can send her in?        ------

## 2014-03-05 NOTE — Telephone Encounter (Signed)
Sent in anusol to pt's pharmacy. Pharmacy to call when Rx is ready

## 2014-03-14 ENCOUNTER — Ambulatory Visit (INDEPENDENT_AMBULATORY_CARE_PROVIDER_SITE_OTHER): Payer: Medicare Other | Admitting: Otolaryngology

## 2014-03-14 DIAGNOSIS — H8000 Otosclerosis involving oval window, nonobliterative, unspecified ear: Secondary | ICD-10-CM

## 2014-03-14 DIAGNOSIS — H902 Conductive hearing loss, unspecified: Secondary | ICD-10-CM

## 2014-03-14 DIAGNOSIS — H6120 Impacted cerumen, unspecified ear: Secondary | ICD-10-CM

## 2014-03-14 DIAGNOSIS — H73819 Atrophic flaccid tympanic membrane, unspecified ear: Secondary | ICD-10-CM

## 2014-04-10 ENCOUNTER — Other Ambulatory Visit: Payer: Self-pay | Admitting: Family Medicine

## 2014-04-10 MED ORDER — POTASSIUM CHLORIDE CRYS ER 20 MEQ PO TBCR: 20.0000 meq | EXTENDED_RELEASE_TABLET | Freq: Every day | ORAL | Status: DC

## 2014-04-10 MED ORDER — HYDROCHLOROTHIAZIDE 25 MG PO TABS: 25.0000 mg | ORAL_TABLET | Freq: Every day | ORAL | Status: DC

## 2014-04-10 MED ORDER — OMEPRAZOLE 40 MG PO CPDR: 40.0000 mg | DELAYED_RELEASE_CAPSULE | Freq: Every day | ORAL | Status: DC

## 2014-04-10 MED ORDER — CLOTRIMAZOLE-BETAMETHASONE 1-0.05 % EX CREA
TOPICAL_CREAM | Freq: Two times a day (BID) | CUTANEOUS | Status: DC
Start: 2014-04-10 — End: 2016-08-02

## 2014-04-10 MED ORDER — ATORVASTATIN CALCIUM 40 MG PO TABS: ORAL_TABLET | ORAL | Status: DC

## 2014-04-10 MED ORDER — LOSARTAN POTASSIUM 100 MG PO TABS: ORAL_TABLET | ORAL | Status: DC

## 2014-04-10 MED ORDER — TRIAMCINOLONE ACETONIDE 0.1 % EX OINT: TOPICAL_OINTMENT | CUTANEOUS | Status: DC

## 2014-04-10 MED ORDER — FLUTICASONE PROPIONATE 50 MCG/ACT NA SUSP: NASAL | Status: DC

## 2014-04-10 NOTE — Telephone Encounter (Signed)
Rx Refilled  

## 2014-04-11 ENCOUNTER — Ambulatory Visit (INDEPENDENT_AMBULATORY_CARE_PROVIDER_SITE_OTHER): Payer: Medicare Other | Admitting: Otolaryngology

## 2014-04-11 ENCOUNTER — Other Ambulatory Visit: Payer: Self-pay | Admitting: Family Medicine

## 2014-04-11 DIAGNOSIS — H9209 Otalgia, unspecified ear: Secondary | ICD-10-CM

## 2014-04-11 DIAGNOSIS — H902 Conductive hearing loss, unspecified: Secondary | ICD-10-CM

## 2014-04-11 MED ORDER — POTASSIUM CHLORIDE CRYS ER 20 MEQ PO TBCR: 20.0000 meq | EXTENDED_RELEASE_TABLET | Freq: Every day | ORAL | Status: DC

## 2014-04-11 MED ORDER — OMEPRAZOLE 40 MG PO CPDR: 40.0000 mg | DELAYED_RELEASE_CAPSULE | Freq: Every day | ORAL | Status: DC

## 2014-04-11 MED ORDER — LOSARTAN POTASSIUM 100 MG PO TABS: ORAL_TABLET | ORAL | Status: DC

## 2014-04-12 ENCOUNTER — Other Ambulatory Visit: Payer: Self-pay | Admitting: *Deleted

## 2014-04-12 MED ORDER — FLUTICASONE PROPIONATE 50 MCG/ACT NA SUSP: NASAL | Status: DC

## 2014-04-12 MED ORDER — ATORVASTATIN CALCIUM 40 MG PO TABS: ORAL_TABLET | ORAL | Status: DC

## 2014-04-12 NOTE — Telephone Encounter (Signed)
Received call from patient.   Requested to have Lipitor and Flonase refilled to [facility].   Prescription sent to pharmacy.

## 2014-04-17 ENCOUNTER — Other Ambulatory Visit: Payer: Self-pay | Admitting: Family Medicine

## 2014-04-17 MED ORDER — OMEPRAZOLE 20 MG PO CPDR: 20.0000 mg | DELAYED_RELEASE_CAPSULE | Freq: Two times a day (BID) | ORAL | Status: DC

## 2014-04-22 ENCOUNTER — Other Ambulatory Visit: Payer: Self-pay | Admitting: Family Medicine

## 2014-04-22 MED ORDER — OMEPRAZOLE 20 MG PO CPDR: 20.0000 mg | DELAYED_RELEASE_CAPSULE | Freq: Two times a day (BID) | ORAL | Status: DC

## 2014-04-22 MED ORDER — HYDROCHLOROTHIAZIDE 25 MG PO TABS: 25.0000 mg | ORAL_TABLET | Freq: Every day | ORAL | Status: DC

## 2014-04-22 NOTE — Telephone Encounter (Signed)
Rx Refilled  

## 2014-04-25 ENCOUNTER — Telehealth: Payer: Self-pay | Admitting: *Deleted

## 2014-04-25 NOTE — Telephone Encounter (Signed)
Received request for PA from pharmacy for Omeprazole.   PA submitted.

## 2014-04-29 ENCOUNTER — Encounter: Payer: Self-pay | Admitting: Physician Assistant

## 2014-04-29 ENCOUNTER — Ambulatory Visit (INDEPENDENT_AMBULATORY_CARE_PROVIDER_SITE_OTHER): Payer: Medicare Other | Admitting: Physician Assistant

## 2014-04-29 VITALS — BP 150/96 | HR 88 | Temp 98.3°F | Resp 18 | Wt 262.0 lb

## 2014-04-29 DIAGNOSIS — IMO0001 Reserved for inherently not codable concepts without codable children: Secondary | ICD-10-CM

## 2014-04-29 DIAGNOSIS — T148 Other injury of unspecified body region: Secondary | ICD-10-CM

## 2014-04-29 DIAGNOSIS — W57XXXA Bitten or stung by nonvenomous insect and other nonvenomous arthropods, initial encounter: Secondary | ICD-10-CM

## 2014-04-29 DIAGNOSIS — R51 Headache: Secondary | ICD-10-CM

## 2014-04-29 DIAGNOSIS — R5381 Other malaise: Secondary | ICD-10-CM

## 2014-04-29 DIAGNOSIS — R5383 Other fatigue: Secondary | ICD-10-CM

## 2014-04-29 MED ORDER — AMOXICILLIN 500 MG PO CAPS: 500.0000 mg | ORAL_CAPSULE | Freq: Three times a day (TID) | ORAL | Status: DC

## 2014-04-29 NOTE — Progress Notes (Signed)
Patient ID: Yolanda Gallegos MRN: [id number], DOB: November 05, 1961, 53 y.o. Date of Encounter: 04/29/2014, 11:04 AM    Chief Complaint:  Chief Complaint  Patient presents with  . 4 tick bites 4 weeks ago    now not feeling well, achy all over, HA's knees are still, occ chiils     HPI: 53 y.o. year old obese white female reports that 4 weeks ago her husband got her to help him work on Scientist, product/process development. Says that she had to lay in the grass. After that she found 4 tics. She shows me the sites of where each of these were. She has had no significant rash at those sites and has had no other areas of rash elsewhere.Marland Kitchen However she is having headache and diffuse body aches as well as malaise.     Home Meds:   Outpatient Prescriptions Prior to Visit  Medication Sig Dispense Refill  . acetaminophen (TYLENOL) 500 MG tablet Take 500 mg by mouth every 6 (six) hours as needed.      Marland Kitchen atorvastatin (LIPITOR) 40 MG tablet TAKE 1 TABLET BY MOUTH EVERY EVENING AT BEDTIME  90 tablet  3  . fluticasone (FLONASE) 50 MCG/ACT nasal spray SPRAY TWICE IN EACH NOSTRIL EVERY DAY  48 g  3  . hydrochlorothiazide (HYDRODIURIL) 25 MG tablet Take 1 tablet (25 mg total) by mouth daily.  90 tablet  3  . hydrocortisone (ANUSOL-HC) 2.5 % rectal cream Place 1 application rectally 2 (two) times daily.  30 g  1  . ibuprofen (ADVIL,MOTRIN) 200 MG tablet Take 200 mg by mouth every 6 (six) hours as needed.      . loratadine (CLARITIN) 10 MG tablet Take 10 mg by mouth daily.      Marland Kitchen losartan (COZAAR) 100 MG tablet TAKE 1 TABLET EVERY DAY  90 tablet  3  . omeprazole (PRILOSEC) 20 MG capsule Take 1 capsule (20 mg total) by mouth 2 (two) times daily before a meal.  180 capsule  3  . potassium chloride SA (K-DUR,KLOR-CON) 20 MEQ tablet Take 1 tablet (20 mEq total) by mouth daily.  90 tablet  3  . triamcinolone ointment (KENALOG) 0.1 % APPLY TWICE A DAY FOR 10 DAYS PRN Rash  454 g  1  . clotrimazole-betamethasone (LOTRISONE) cream Apply topically  2 (two) times daily.  135 g  3   No facility-administered medications prior to visit.    Allergies:  Allergies  Allergen Reactions  . Doxycycline Hives and Shortness Of Breath  . Sulfa Antibiotics     Per positive allergy test      Review of Systems: See HPI for pertinent ROS. All other ROS negative.    Physical Exam: Blood pressure 150/96, pulse 88, temperature 98.3 F (36.8 C), temperature source Oral, resp. rate 18, weight 262 lb (118.842 kg)., Body mass index is 42.31 kg/(m^2). General: Obese WF.  Appears in no acute distress. Neck: Supple. No thyromegaly. No lymphadenopathy. Lungs: Clear bilaterally to auscultation without wheezes, rales, or rhonchi. Breathing is unlabored. Heart: Regular rhythm. No murmurs, rubs, or gallops. Msk:  Strength and tone normal for age. Extremities/Skin: Warm and dry. She shows me the sites of where each of the 4 tics were: 1 at the right posterior neck. I can see area that is very light pink. That measures about 1/2 cm. She shows me a similar site on her right shoulder blade area. She shows me a similar site on her right low back/right flank. All of these  areas look like a mosquito bite-type area that is resolving and each site is consistent with simple localized reaction. Remainder of skin is normal with no other areas of rash. Neuro: Alert and oriented X 3. Moves all extremities spontaneously. Gait is normal. CNII-XII grossly in tact. Psych:  Responds to questions appropriately with a normal affect.     ASSESSMENT AND PLAN:  53 y.o. year old female with  1. Tick bites - amoxicillin (AMOXIL) 500 MG capsule; Take 1 capsule (500 mg total) by mouth 3 (three) times daily.  Dispense: 63 capsule; Refill: 0  2. Headache(784.0) - amoxicillin (AMOXIL) 500 MG capsule; Take 1 capsule (500 mg total) by mouth 3 (three) times daily.  Dispense: 63 capsule; Refill: 0  3. Other malaise and fatigue - amoxicillin (AMOXIL) 500 MG capsule; Take 1 capsule  (500 mg total) by mouth 3 (three) times daily.  Dispense: 63 capsule; Refill: 0  4. Myalgia and myositis - amoxicillin (AMOXIL) 500 MG capsule; Take 1 capsule (500 mg total) by mouth 3 (three) times daily.  Dispense: 63 capsule; Refill: 0  Discussed with her that given her symptoms I will go ahead and start empiric treatment. Discussed with her doing titers for Lyme disease /  [facility] spotted fever. She defers.  She has allergy to doxycycline. Therefore we'll treat with amoxicillin. She is to take 500 mg 3 times a day for 21 days. Follow up if symptoms worsen or do not resolve with completion of antibiotic.   Signed, [address] [place], [place], [facility] 04/29/2014 11:04 AM

## 2014-05-10 NOTE — Telephone Encounter (Signed)
Call placed to insurance to inquire about PA.   Determination received: PA denied. States that Omeprazole 20mg  PO QD would be covered, but BID dosing is not.

## 2014-10-21 ENCOUNTER — Encounter: Payer: Self-pay | Admitting: Internal Medicine

## 2014-11-07 ENCOUNTER — Ambulatory Visit (INDEPENDENT_AMBULATORY_CARE_PROVIDER_SITE_OTHER): Payer: Medicare Other | Admitting: Otolaryngology

## 2014-11-28 ENCOUNTER — Other Ambulatory Visit: Payer: Self-pay | Admitting: *Deleted

## 2014-11-28 MED ORDER — ATORVASTATIN CALCIUM 40 MG PO TABS: ORAL_TABLET | ORAL | Status: DC

## 2014-11-28 MED ORDER — HYDROCHLOROTHIAZIDE 25 MG PO TABS: 25.0000 mg | ORAL_TABLET | Freq: Every day | ORAL | Status: DC

## 2014-11-28 MED ORDER — TRIAMCINOLONE ACETONIDE 0.1 % EX OINT
TOPICAL_OINTMENT | CUTANEOUS | Status: DC
Start: 2014-11-28 — End: 2015-07-25

## 2014-11-28 MED ORDER — OMEPRAZOLE 20 MG PO CPDR: 20.0000 mg | DELAYED_RELEASE_CAPSULE | Freq: Two times a day (BID) | ORAL | Status: DC

## 2014-11-28 MED ORDER — LOSARTAN POTASSIUM 100 MG PO TABS: ORAL_TABLET | ORAL | Status: DC

## 2014-11-28 MED ORDER — FLUTICASONE PROPIONATE 50 MCG/ACT NA SUSP: NASAL | Status: DC

## 2014-11-28 MED ORDER — POTASSIUM CHLORIDE CRYS ER 20 MEQ PO TBCR: 20.0000 meq | EXTENDED_RELEASE_TABLET | Freq: Every day | ORAL | Status: DC

## 2014-11-28 NOTE — Telephone Encounter (Signed)
Received fax requesting refill on medications to [facility].   Prescription sent to pharmacy.

## 2014-12-02 ENCOUNTER — Encounter: Payer: Self-pay | Admitting: Family Medicine

## 2014-12-02 ENCOUNTER — Ambulatory Visit (INDEPENDENT_AMBULATORY_CARE_PROVIDER_SITE_OTHER): Payer: Commercial Managed Care - HMO | Admitting: Family Medicine

## 2014-12-02 VITALS — BP 140/78 | HR 98 | Temp 97.9°F | Resp 18 | Ht 66.0 in | Wt 268.0 lb

## 2014-12-02 DIAGNOSIS — J449 Chronic obstructive pulmonary disease, unspecified: Secondary | ICD-10-CM | POA: Diagnosis not present

## 2014-12-02 DIAGNOSIS — Z23 Encounter for immunization: Secondary | ICD-10-CM

## 2014-12-02 DIAGNOSIS — Z0000 Encounter for general adult medical examination without abnormal findings: Secondary | ICD-10-CM | POA: Diagnosis not present

## 2014-12-02 DIAGNOSIS — E042 Nontoxic multinodular goiter: Secondary | ICD-10-CM | POA: Diagnosis not present

## 2014-12-02 DIAGNOSIS — E785 Hyperlipidemia, unspecified: Secondary | ICD-10-CM | POA: Diagnosis not present

## 2014-12-02 DIAGNOSIS — I10 Essential (primary) hypertension: Secondary | ICD-10-CM | POA: Diagnosis not present

## 2014-12-02 LAB — COMPLETE METABOLIC PANEL WITH GFR
ALT: 25 U/L (ref 0–35)
AST: 17 U/L (ref 0–37)
Albumin: 4.2 g/dL (ref 3.5–5.2)
Alkaline Phosphatase: 99 U/L (ref 39–117)
BUN: 11 mg/dL (ref 6–23)
CALCIUM: 9.6 mg/dL (ref 8.4–10.5)
CHLORIDE: 101 meq/L (ref 96–112)
CO2: 30 mEq/L (ref 19–32)
Creat: 0.53 mg/dL (ref 0.50–1.10)
Glucose, Bld: 113 mg/dL — ABNORMAL HIGH (ref 70–99)
Potassium: 3.5 mEq/L (ref 3.5–5.3)
SODIUM: 142 meq/L (ref 135–145)
Total Bilirubin: 0.6 mg/dL (ref 0.2–1.2)
Total Protein: 7.1 g/dL (ref 6.0–8.3)

## 2014-12-02 LAB — LIPID PANEL
CHOL/HDL RATIO: 4.3 ratio
CHOLESTEROL: 167 mg/dL (ref 0–200)
HDL: 39 mg/dL — AB (ref >39)
LDL Cholesterol: 99 mg/dL (ref 0–99)
TRIGLYCERIDES: 143 mg/dL (ref <150)
VLDL: 29 mg/dL (ref 0–40)

## 2014-12-02 LAB — TSH: TSH: 0.023 u[IU]/mL — AB (ref 0.350–4.500)

## 2014-12-02 LAB — CBC WITH DIFFERENTIAL/PLATELET
BASOS ABS: 0 10*3/uL (ref 0.0–0.1)
Basophils Relative: 0 % (ref 0–1)
Eosinophils Absolute: 0.3 10*3/uL (ref 0.0–0.7)
Eosinophils Relative: 3 % (ref 0–5)
HEMATOCRIT: 41.1 % (ref 36.0–46.0)
HEMOGLOBIN: 14.1 g/dL (ref 12.0–15.0)
LYMPHS ABS: 3.1 10*3/uL (ref 0.7–4.0)
Lymphocytes Relative: 33 % (ref 12–46)
MCH: 28.4 pg (ref 26.0–34.0)
MCHC: 34.3 g/dL (ref 30.0–36.0)
MCV: 82.7 fL (ref 78.0–100.0)
MONOS PCT: 9 % (ref 3–12)
MPV: 8.9 fL (ref 8.6–12.4)
Monocytes Absolute: 0.8 10*3/uL (ref 0.1–1.0)
Neutro Abs: 5.2 10*3/uL (ref 1.7–7.7)
Neutrophils Relative %: 55 % (ref 43–77)
Platelets: 338 10*3/uL (ref 150–400)
RBC: 4.97 MIL/uL (ref 3.87–5.11)
RDW: 14.2 % (ref 11.5–15.5)
WBC: 9.4 10*3/uL (ref 4.0–10.5)

## 2014-12-02 MED ORDER — OMEPRAZOLE 40 MG PO CPDR: 40.0000 mg | DELAYED_RELEASE_CAPSULE | Freq: Every day | ORAL | Status: DC

## 2014-12-02 NOTE — Addendum Note (Signed)
Addended by: Shary Decamp B on: 12/02/2014 02:05 PM   Modules accepted: Orders

## 2014-12-02 NOTE — Progress Notes (Signed)
Subjective:    Patient ID: Yolanda Gallegos, female    DOB: Nov 01, 1961, 54 y.o.   MRN: [id number]  HPI Patient is a very pleasant 54 year old white female who is here today for complete physical exam. Last year we discover the patient has a multinodular goiter. She is due to recheck her TSH. She is also due for a mammogram. She is also due for fasting lab work. Patient's pneumonia vaccine, tetanus shot are up-to-date. She is due for a flu shot. Past medical history is significant for hysterectomy and therefore she does not require a Pap smear. Her colonoscopy was performed last year and was significant for several precancerous polyps. Her gastroenterologist wanted to see her back after one year. She would like me to refer her back to him. Past Medical History  Diagnosis Date  . Hypertension   . Hyperlipidemia   . COPD (chronic obstructive pulmonary disease)   . Smoker   . Meningioma   . GERD (gastroesophageal reflux disease)   . Allergy   . Tubular adenoma 2015  . Colon polyps 2015  . Multinodular goiter     subclinical hyperthyroidism   Past Surgical History  Procedure Laterality Date  . 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 Abdominal hysterectomy  1999    left oopherectomy  . Breast biopsy Left 2007    benign  . Inner ear surgery Right 2001  . Cesarean section  1982, 1985, 1987   Current Outpatient Prescriptions on File Prior to Visit  Medication Sig Dispense Refill  . acetaminophen (TYLENOL) 500 MG tablet Take 500 mg by mouth every 6 (six) hours as needed.    Marland Kitchen atorvastatin (LIPITOR) 40 MG tablet TAKE 1 TABLET BY MOUTH EVERY EVENING AT BEDTIME 90 tablet 3  . clotrimazole-betamethasone (LOTRISONE) cream Apply topically 2 (two) times daily. 135 g 3  . fluticasone (FLONASE) 50 MCG/ACT nasal spray SPRAY TWICE IN EACH NOSTRIL EVERY DAY 48 g 3  . hydrochlorothiazide (HYDRODIURIL) 25 MG tablet Take 1 tablet (25 mg total) by mouth daily. 90 tablet 3  .  loratadine (CLARITIN) 10 MG tablet Take 10 mg by mouth daily.    Marland Kitchen losartan (COZAAR) 100 MG tablet TAKE 1 TABLET EVERY DAY 90 tablet 3  . potassium chloride SA (K-DUR,KLOR-CON) 20 MEQ tablet Take 1 tablet (20 mEq total) by mouth daily. 90 tablet 3  . triamcinolone ointment (KENALOG) 0.1 % APPLY TWICE A DAY FOR 10 DAYS PRN Rash 454 g 1   No current facility-administered medications on file prior to visit.   Allergies  Allergen Reactions  . Doxycycline Hives and Shortness Of Breath  . Sulfa Antibiotics     Per positive allergy test   History   Social History  . Marital Status: Divorced    Spouse Name: N/A    Number of Children: N/A  . Years of Education: N/A   Occupational History  . Not on file.   Social History Main Topics  . Smoking status: Former Smoker    Quit date: 01/21/2012  . Smokeless tobacco: Never Used  . Alcohol Use: No  . Drug Use: No  . Sexual Activity: Not on file     Comment: married, son is battling leukemia.   Other Topics Concern  . Not on file   Social History Narrative      Review of Systems  Cardiovascular: Positive for palpitations.  All other systems reviewed and are negative.  Objective:   Physical Exam  Constitutional: She is oriented to person, place, and time. She appears well-developed and well-nourished. No distress.  HENT:  Head: Normocephalic and atraumatic.  Right Ear: External ear normal.  Left Ear: External ear normal.  Nose: Nose normal.  Mouth/Throat: Oropharynx is clear and moist. No oropharyngeal exudate.  Eyes: Conjunctivae and EOM are normal. Pupils are equal, round, and reactive to light. Right eye exhibits no discharge. Left eye exhibits no discharge. No scleral icterus.  Neck: Normal range of motion. Neck supple. No JVD present. No tracheal deviation present. No thyromegaly present.  Cardiovascular: Normal rate, regular rhythm, normal heart sounds and intact distal pulses.  Exam reveals no gallop and no friction  rub.   No murmur heard. Pulmonary/Chest: Effort normal and breath sounds normal. No stridor. No respiratory distress. She has no wheezes. She has no rales. She exhibits no tenderness.  Abdominal: Soft. Bowel sounds are normal. She exhibits no distension and no mass. There is no tenderness. There is no rebound and no guarding.  Musculoskeletal: Normal range of motion. She exhibits no edema or tenderness.  Lymphadenopathy:    She has no cervical adenopathy.  Neurological: She is alert and oriented to person, place, and time. She has normal reflexes. She displays normal reflexes. No cranial nerve deficit. She exhibits normal muscle tone. Coordination normal.  Skin: Skin is warm. No rash noted. She is not diaphoretic. No erythema. No pallor.  Psychiatric: She has a normal mood and affect. Her behavior is normal. Judgment and thought content normal.  Vitals reviewed.         Assessment & Plan:  Routine general medical examination at a health care facility - Plan: MM Digital Screening, Ambulatory referral to Gastroenterology, COMPLETE METABOLIC PANEL WITH GFR, CBC with Differential, Lipid panel, TSH  I'll check fasting lab work today including a CBC, CMP, fasting lipid panel, and TSH. I want to make sure that the patient's multinodular goiter has not become toxic. Patient's immunizations are up-to-date except for her flu shot. She will receive that today. I will refer her back to her gastroenterologist for repeat colonoscopy. I will also schedule the patient for mammogram. Regular anticipatory guidance was provided including diet exercise and weight loss recommendations.

## 2014-12-03 ENCOUNTER — Encounter: Payer: Self-pay | Admitting: Family Medicine

## 2014-12-06 ENCOUNTER — Encounter: Payer: Self-pay | Admitting: *Deleted

## 2014-12-11 ENCOUNTER — Other Ambulatory Visit: Payer: Self-pay | Admitting: Family Medicine

## 2014-12-11 DIAGNOSIS — Z1231 Encounter for screening mammogram for malignant neoplasm of breast: Secondary | ICD-10-CM

## 2014-12-16 ENCOUNTER — Encounter: Payer: Self-pay | Admitting: Internal Medicine

## 2014-12-30 ENCOUNTER — Ambulatory Visit
Admission: RE | Admit: 2014-12-30 | Discharge: 2014-12-30 | Disposition: A | Discharge status: Home / Self Care | Payer: Commercial Managed Care - HMO | Source: Ambulatory Visit | Attending: Family Medicine | Admitting: Family Medicine

## 2014-12-30 ENCOUNTER — Other Ambulatory Visit: Payer: Self-pay | Admitting: Family Medicine

## 2014-12-30 DIAGNOSIS — Z1231 Encounter for screening mammogram for malignant neoplasm of breast: Secondary | ICD-10-CM

## 2014-12-30 DIAGNOSIS — H5210 Myopia, unspecified eye: Secondary | ICD-10-CM | POA: Diagnosis not present

## 2014-12-30 DIAGNOSIS — H524 Presbyopia: Secondary | ICD-10-CM | POA: Diagnosis not present

## 2015-01-09 ENCOUNTER — Telehealth: Payer: Self-pay | Admitting: Family Medicine

## 2015-01-09 DIAGNOSIS — R200 Anesthesia of skin: Secondary | ICD-10-CM

## 2015-01-09 DIAGNOSIS — M545 Low back pain, unspecified: Secondary | ICD-10-CM

## 2015-01-09 NOTE — Telephone Encounter (Signed)
[phone] PT states that she is wanting to do the MRI of her back Pt is also wanting something to take so she want flip out when doing this

## 2015-01-09 NOTE — Telephone Encounter (Signed)
Pt states when she was in office and provider had suggested MRI of back she declined, now pt wants to go ahead with it because now she is having more pain in her back and numbness in left leg.?ok to do referral for MRI?  Also wants something prescribed so when she has MRI she will be relaxed..please advise!

## 2015-01-10 NOTE — Telephone Encounter (Signed)
Pending authorizaton

## 2015-01-10 NOTE — Telephone Encounter (Signed)
MRI ordered

## 2015-01-10 NOTE — Telephone Encounter (Signed)
[place] with MRI of lumbar spine.

## 2015-01-10 NOTE — Telephone Encounter (Signed)
She could take valium 10 mg 30 min prior to mri.

## 2015-01-10 NOTE — Telephone Encounter (Signed)
Pt wants to know if you could prescribe her something to take to help ease and relax her during MRI

## 2015-01-16 NOTE — Telephone Encounter (Signed)
Checked status of authorization still pending, pending Josem Kaufmann is [id number]

## 2015-01-17 ENCOUNTER — Other Ambulatory Visit: Payer: Self-pay | Admitting: Family Medicine

## 2015-01-17 DIAGNOSIS — G8929 Other chronic pain: Secondary | ICD-10-CM

## 2015-01-17 DIAGNOSIS — R200 Anesthesia of skin: Secondary | ICD-10-CM

## 2015-01-17 DIAGNOSIS — M545 Low back pain: Principal | ICD-10-CM

## 2015-02-04 ENCOUNTER — Ambulatory Visit (HOSPITAL_COMMUNITY): Payer: Commercial Managed Care - HMO | Attending: Family Medicine | Admitting: Physical Therapy

## 2015-02-04 DIAGNOSIS — M5410 Radiculopathy, site unspecified: Secondary | ICD-10-CM | POA: Insufficient documentation

## 2015-02-04 NOTE — Patient Instructions (Signed)
Backward Bend (Standing)   Arch backward to make hollow of back deeper. Hold __3__ seconds. Repeat __10__ times per set. Do __1__ sets per session. Do ___3_ sessions per day.  http://orth.exer.us/178   Copyright  VHI. All rights reserved.  Hamstring Stretch: Active   Support behind right knee. Starting with knee bent, attempt to straighten knee until a comfortable stretch is felt in back of thigh. Hold __30__ seconds. Repeat ___3_ times per set. Do ___1_ sets per session. Do 2___ sessions per day.  http://orth.exer.us/158   Copyright  VHI. All rights reserved.  Hamstring Stretch: Active   Support behind right knee. Starting with knee bent, attempt to straighten knee until a comfortable stretch is felt in back of thigh. Hold ____ seconds. Repeat ____ times per set. Do ____ sets per session. Do ____ sessions per day.  http://orth.exer.us/158   Copyright  VHI. All rights reserved.  Knee-to-Chest Stretch: Unilateral   With hand behind right knee, pull knee in to chest until a comfortable stretch is felt in lower back and buttocks. Keep back relaxed. Hold __30__ seconds. Repeat __3__ times per set. Do _1__ sets per session. Do __2__ sessions per day.  http://orth.exer.us/126   Copyright  VHI. All rights reserved.  Isometric Abdominal   Lying on back with knees bent, tighten stomach by pressing elbows down. Hold __5__ seconds. Repeat __10_ times per set. Do __1__ sets per session. Do _5-8___ sessions per day.  http://orth.exer.us/1086   Copyright  VHI. All rights reserved.  Press-Up   Press upper body upward, keeping hips in contact with floor. Keep lower back and buttocks relaxed. Hold _3___ seconds. Repeat ___10_ times per set. Do _1__ sets per session. Do _2___ sessions per day.  http://orth.exer.us/94   Copyright  VHI. All rights reserved.

## 2015-02-04 NOTE — Therapy (Signed)
[place], [place], [address] Phone: [phone]   Fax:  [phone]  Physical Therapy Evaluation  Patient Details  Name: Yolanda Gallegos MRN: [id number] Date of Birth: 1961-10-19 Referring Provider:  [name], MD  Encounter Date: 02/04/2015      PT End of Session - 02/04/15 1158    Visit Number 1   Number of Visits 8   Date for PT Re-Evaluation 03/06/15   Authorization Type [employer]   Authorization - Visit Number 1   Authorization - Number of Visits 8   PT Start Time 2458   PT Stop Time 1200   PT Time Calculation (min) 55 min      Past Medical History  Diagnosis Date  . Hypertension   . Hyperlipidemia   . COPD (chronic obstructive pulmonary disease)   . Smoker   . Meningioma   . GERD (gastroesophageal reflux disease)   . Allergy   . Tubular adenoma 2015  . Colon polyps 2015  . Multinodular goiter     subclinical hyperthyroidism    Past Surgical History  Procedure Laterality Date  . 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 Abdominal hysterectomy  1999    left oopherectomy  . Breast biopsy Left 2007    benign  . Inner ear surgery Right 2001  . Cesarean section  1982, 1985, 1987    There were no vitals filed for this visit.  Visit Diagnosis:  Radicular low back pain      Subjective Assessment - 02/04/15 1206    Pain Score --  highest 7-8            [facility] PT Assessment - 02/04/15 0001    Assessment   Medical Diagnosis Low back pain   Onset Date 01/06/15   Next MD Visit 06/11/2015   Prior Therapy none   Precautions   Precautions None   Restrictions   Weight Bearing Restrictions No   Balance Screen   Has the patient fallen in the past 6 months No   Has the patient had a decrease in activity level because of a fear of falling?  No   Is the patient reluctant to leave their home because of a fear of falling?  No   Prior Function   Level of  Independence Independent with basic ADLs   Vocation On disability   Leisure gardening    Observation/Other Assessments   Focus on Therapeutic Outcomes (FOTO)  63   Posture/Postural Control   Posture/Postural Control Postural limitations   Postural Limitations Increased lumbar lordosis;Anterior pelvic tilt;Increased thoracic kyphosis   AROM   Lumbar Flexion wfl with reps causing no change    Lumbar Extension wfl with decreased pain    Lumbar - Right Side Bend wfl with reps decreasing sx    Lumbar - Left Side Bend wfl with reps increase sx    Lumbar - Right Rotation wfl    Lumbar - Left Rotation decreased 15%   Strength   Right Hip Flexion 5/5   Right Hip Extension 5/5   Right Hip ABduction 5/5   Left Hip Flexion 5/5   Left Hip Extension 5/5   Left Hip ABduction 5/5   Left Knee Flexion 5/5   Left Knee Extension 5/5   Right Ankle Dorsiflexion 5/5   Left Ankle Dorsiflexion 5/5  [facility] Adult PT Treatment/Exercise - 2015/03/05 0001    Exercises   Exercises Lumbar   Lumbar Exercises: Stretches   Active Hamstring Stretch 2 reps;30 seconds   Single Knee to Chest Stretch 2 reps;30 seconds   Standing Extension 5 reps   Press Ups 5 reps   Lumbar Exercises: Supine   Ab Set 10 reps                PT Education - March 05, 2015 1158    Education provided Yes   Education Details HEP for stretching and ab set   Person(s) Educated Patient   Methods Explanation;Handout   Comprehension Verbalized understanding;Returned demonstration          PT Short Term Goals - 05-Mar-2015 1203    PT SHORT TERM GOAL #1   Title I HEP   Time 1   Period Weeks   PT SHORT TERM GOAL #2   Title Pt to be able to sit for 30 minutes in comfort in order to watch a  short  TV show   Time 2   Period Weeks   PT SHORT TERM GOAL #3   Title Pt to be able to stand for 15 minutes in order to complete dishes without increased pain    Time 2   Period Weeks   PT SHORT TERM GOAL #4    Title Sx no further than the knee level    Time 2   Period Weeks           PT Long Term Goals - 03-05-2015 1204    PT LONG TERM GOAL #1   Title I in advance HEP   Time 4   Period Weeks   PT LONG TERM GOAL #2   Title Pt to be able to sit for an hour to be able to travel or go out to eat without increased pain   Time 4   Period Weeks   PT LONG TERM GOAL #3   Title Pt to be able to stand for 30 minutes to socialize without increased pain    Time 4   Period Weeks   PT LONG TERM GOAL #4   Title Pt sx to be in hip area only   Time 4   Period Weeks   PT LONG TERM GOAL #5   Title Pain level to be no greater than 3/10               Plan - Mar 05, 2015 1159    Clinical Impression Statement [name] is a 54 yo female complaining of radicular back pain that goes down to her Lt foot.  She has been referred to skilled therapy to decrease her sx.  Examination demonstrates weakened abdominal mm; and a preference for extension.  [name] will benefit from skilled PT to decrease her pain and improve her quality of life.    Pt will benefit from skilled therapeutic intervention in order to improve on the following deficits Decreased range of motion;Decreased strength;Pain   Rehab Potential Good   PT Frequency 2x / week   PT Duration 4 weeks   PT Treatment/Interventions Patient/family education;Therapeutic activities;Therapeutic exercise;Traction   PT Next Visit Plan begin stabilization program as well as pelvic traction.  Traction may be done statically with Max initially at 70#    PT Home Exercise Plan given          G-Codes - 2015/03/05 03/20/1206    Functional Assessment Tool Used foto/clinical judgement   Functional Limitation  Changing and maintaining body position   Changing and Maintaining Body Position Current Status [phone]) At least 40 percent but less than 60 percent impaired, limited or restricted   Changing and Maintaining Body Position Goal Status ([id number]) At least 20 percent but  less than 40 percent impaired, limited or restricted       Problem List Patient Active Problem List   Diagnosis Date Noted  . Multinodular goiter   . Adenomatous colon polyp 02/11/2014  . Hypertension   . Hyperlipidemia   . COPD (chronic obstructive pulmonary disease)   . Smoker     RUSSELL,CINDY PT 02/04/2015, 12:09 PM  [place] [address] [place], [place], [address] Phone: [phone]   Fax:  [phone]

## 2015-02-13 ENCOUNTER — Encounter (HOSPITAL_COMMUNITY): Payer: Commercial Managed Care - HMO | Admitting: Physical Therapy

## 2015-02-17 ENCOUNTER — Emergency Department (HOSPITAL_COMMUNITY)
Admission: EM | Admit: 2015-02-17 | Discharge: 2015-02-17 | Disposition: A | Discharge status: Home / Self Care | Payer: Commercial Managed Care - HMO | Attending: Emergency Medicine | Admitting: Emergency Medicine

## 2015-02-17 ENCOUNTER — Emergency Department (HOSPITAL_COMMUNITY): Payer: Commercial Managed Care - HMO

## 2015-02-17 ENCOUNTER — Encounter (HOSPITAL_COMMUNITY): Payer: Self-pay | Admitting: Emergency Medicine

## 2015-02-17 DIAGNOSIS — I10 Essential (primary) hypertension: Secondary | ICD-10-CM | POA: Diagnosis not present

## 2015-02-17 DIAGNOSIS — K219 Gastro-esophageal reflux disease without esophagitis: Secondary | ICD-10-CM | POA: Diagnosis not present

## 2015-02-17 DIAGNOSIS — E785 Hyperlipidemia, unspecified: Secondary | ICD-10-CM | POA: Diagnosis not present

## 2015-02-17 DIAGNOSIS — M25512 Pain in left shoulder: Secondary | ICD-10-CM | POA: Insufficient documentation

## 2015-02-17 DIAGNOSIS — Z7952 Long term (current) use of systemic steroids: Secondary | ICD-10-CM | POA: Diagnosis not present

## 2015-02-17 DIAGNOSIS — R200 Anesthesia of skin: Secondary | ICD-10-CM | POA: Diagnosis not present

## 2015-02-17 DIAGNOSIS — Z86010 Personal history of colonic polyps: Secondary | ICD-10-CM | POA: Diagnosis not present

## 2015-02-17 DIAGNOSIS — Z87891 Personal history of nicotine dependence: Secondary | ICD-10-CM | POA: Insufficient documentation

## 2015-02-17 DIAGNOSIS — J449 Chronic obstructive pulmonary disease, unspecified: Secondary | ICD-10-CM | POA: Insufficient documentation

## 2015-02-17 LAB — CBC WITH DIFFERENTIAL/PLATELET
Basophils Absolute: 0 10*3/uL (ref 0.0–0.1)
Basophils Relative: 0 % (ref 0–1)
Eosinophils Absolute: 0.2 10*3/uL (ref 0.0–0.7)
Eosinophils Relative: 3 % (ref 0–5)
HCT: 42.1 % (ref 36.0–46.0)
Hemoglobin: 13.9 g/dL (ref 12.0–15.0)
Lymphocytes Relative: 36 % (ref 12–46)
Lymphs Abs: 2.9 10*3/uL (ref 0.7–4.0)
MCH: 28.7 pg (ref 26.0–34.0)
MCHC: 33 g/dL (ref 30.0–36.0)
MCV: 87 fL (ref 78.0–100.0)
Monocytes Absolute: 0.7 10*3/uL (ref 0.1–1.0)
Monocytes Relative: 8 % (ref 3–12)
Neutro Abs: 4.3 10*3/uL (ref 1.7–7.7)
Neutrophils Relative %: 53 % (ref 43–77)
Platelets: 273 10*3/uL (ref 150–400)
RBC: 4.84 MIL/uL (ref 3.87–5.11)
RDW: 13.7 % (ref 11.5–15.5)
WBC: 8.2 10*3/uL (ref 4.0–10.5)

## 2015-02-17 LAB — BASIC METABOLIC PANEL
Anion gap: 9 (ref 5–15)
BUN: 13 mg/dL (ref 6–23)
CO2: 30 mmol/L (ref 19–32)
Calcium: 8.8 mg/dL (ref 8.4–10.5)
Chloride: 101 mmol/L (ref 96–112)
Creatinine, Ser: 0.58 mg/dL (ref 0.50–1.10)
GFR calc Af Amer: >90 mL/min (ref >90)
GFR calc non Af Amer: >90 mL/min (ref >90)
Glucose, Bld: 138 mg/dL — ABNORMAL HIGH (ref 70–99)
Potassium: 2.9 mmol/L — ABNORMAL LOW (ref 3.5–5.1)
Sodium: 140 mmol/L (ref 135–145)

## 2015-02-17 LAB — TROPONIN I: Troponin I: <0.03 ng/mL (ref <0.031)

## 2015-02-17 MED ORDER — IBUPROFEN 400 MG PO TABS
600.0000 mg | ORAL_TABLET | Freq: Once | ORAL | Status: AC
  Administered 2015-02-17: 600 mg via ORAL
  Filled 2015-02-17: qty 2

## 2015-02-17 MED ORDER — POTASSIUM CHLORIDE CRYS ER 20 MEQ PO TBCR
60.0000 meq | EXTENDED_RELEASE_TABLET | Freq: Once | ORAL | Status: AC
  Administered 2015-02-17: 60 meq via ORAL
  Filled 2015-02-17: qty 3

## 2015-02-17 MED ORDER — TRAMADOL HCL 50 MG PO TABS: 50.0000 mg | ORAL_TABLET | Freq: Four times a day (QID) | ORAL | Status: DC | PRN

## 2015-02-17 MED ORDER — OXYCODONE-ACETAMINOPHEN 5-325 MG PO TABS
2.0000 | ORAL_TABLET | Freq: Once | ORAL | Status: AC
  Administered 2015-02-17: 2 via ORAL
  Filled 2015-02-17: qty 2

## 2015-02-17 NOTE — ED Notes (Signed)
Patient with c/o left arm pain and numbess that started 3 days ago. Neuro intact. A/o x 4.

## 2015-02-17 NOTE — ED Notes (Signed)
Patient with no complaints at this time. Respirations even and unlabored. Skin warm/dry. Discharge instructions reviewed with patient at this time. Patient given opportunity to voice concerns/ask questions. Patient discharged at this time and left Emergency Department with steady gait.   

## 2015-02-17 NOTE — Discharge Instructions (Signed)

## 2015-02-17 NOTE — ED Provider Notes (Signed)
CSN: [id number]     Arrival date & time 02/17/15  7096 History  This chart was scribed for Virgel Manifold, MD by Edison Simon, ED Scribe. This patient was seen in room APA08/APA08 and the patient's care was started at 10:12 AM.    Chief Complaint  Patient presents with  . Numbness   The history is provided by the patient. No language interpreter was used.    HPI Comments: Yolanda Gallegos is a 54 y.o. female with history of HTN who presents to the Emergency Department complaining of constant left upper arm pain and numbness with onset upon waking 3 days ago. She states she began to have tingling today which has been intermittent but constant today. She reports associated nausea and lightheadedness with onset this morning. She states she has some congestion. She states she has palpitations intermittently at baseline. She has used Ibuprofen with some improvement. She denies recent falls or injuries.  Past Medical History  Diagnosis Date  . Hypertension   . Hyperlipidemia   . COPD (chronic obstructive pulmonary disease)   . Smoker   . Meningioma   . GERD (gastroesophageal reflux disease)   . Allergy   . Tubular adenoma 2015  . Colon polyps 2015  . Multinodular goiter     subclinical hyperthyroidism   Past Surgical History  Procedure Laterality Date  . 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 Abdominal hysterectomy  1999    left oopherectomy  . Breast biopsy Left 2007    benign  . Inner ear surgery Right 2001  . Cesarean section  1982, 1985, 1987   Family History  Problem Relation Age of Onset  . Hypertension Mother   . Cancer Mother     cervical  . Cancer Father     leukemia  . Hyperlipidemia Sister   . Heart disease Brother   . Hyperlipidemia Brother   . Colon cancer Neg Hx    History  Substance Use Topics  . Smoking status: Former Smoker    Quit date: 01/21/2012  . Smokeless tobacco: Never Used  . Alcohol Use: No   OB History    No data  available     Review of Systems  HENT: Positive for congestion.   Cardiovascular: Positive for palpitations.  Gastrointestinal: Positive for nausea.  Musculoskeletal:       Left arm pain  Neurological: Positive for light-headedness and numbness.  All other systems reviewed and are negative.     Allergies  Doxycycline; Mobic; and Sulfa antibiotics  Home Medications   Prior to Admission medications   Medication Sig Start Date End Date Taking? Authorizing Provider  atorvastatin (LIPITOR) 40 MG tablet TAKE 1 TABLET BY MOUTH EVERY EVENING AT BEDTIME 11/28/14  Yes [name], MD  fluticasone [facility]) 50 MCG/ACT nasal spray SPRAY TWICE IN EACH NOSTRIL EVERY DAY 11/28/14  Yes [name], MD  hydrochlorothiazide (HYDRODIURIL) 25 MG tablet Take 1 tablet (25 mg total) by mouth daily. 11/28/14  Yes [name], MD  losartan (COZAAR) 100 MG tablet TAKE 1 TABLET EVERY DAY 11/28/14  Yes [name], MD  omeprazole (PRILOSEC) 40 MG capsule Take 1 capsule (40 mg total) by mouth daily. 12/02/14  Yes [name], MD  potassium chloride SA (K-DUR,KLOR-CON) 20 MEQ tablet Take 1 tablet (20 mEq total) by mouth daily. 11/28/14  Yes [name], MD  triamcinolone ointment (KENALOG) 0.1 % APPLY TWICE A DAY  FOR 10 DAYS PRN Rash 11/28/14  Yes [name], MD  acetaminophen (TYLENOL) 500 MG tablet Take 500 mg by mouth every 6 (six) hours as needed.    Historical Provider, MD  clotrimazole-betamethasone (LOTRISONE) cream Apply topically 2 (two) times daily. Patient not taking: Reported on 02/17/2015 04/10/14   [name], MD  loratadine (CLARITIN) 10 MG tablet Take 10 mg by mouth daily.    Historical Provider, MD   BP 153/99 mmHg  Pulse 95  Temp(Src) 97.9 F (36.6 C) (Oral)  Resp 18  Ht 5\' 6"  (1.676 m)  Wt 262 lb (118.842 kg)  BMI 42.31 kg/m2  SpO2 94% Physical Exam  Constitutional: She appears well-developed and well-nourished. No distress.  HENT:  Head: Normocephalic  and atraumatic.  Eyes: Conjunctivae are normal. Right eye exhibits no discharge. Left eye exhibits no discharge.  Neck: Neck supple.  Cardiovascular: Normal rate, regular rhythm and normal heart sounds.  Exam reveals no gallop and no friction rub.   No murmur heard. Pulmonary/Chest: Effort normal and breath sounds normal. No respiratory distress.  Abdominal: Soft. She exhibits no distension. There is no tenderness.  Musculoskeletal: She exhibits no edema or tenderness.  Increased pain with external rotation and abduction of left shoulder Neurovascularly intact distally  Neurological: She is alert.  Skin: Skin is warm and dry.  Psychiatric: She has a normal mood and affect. Her behavior is normal. Thought content normal.  Nursing note and vitals reviewed.   ED Course  Procedures (including critical care time)  DIAGNOSTIC STUDIES: Oxygen Saturation is 94% on room air, adequate by my interpretation.    COORDINATION OF CARE: 10:18 AM Discussed treatment plan with patient at beside, the patient agrees with the plan and has no further questions at this time.   Labs Review Labs Reviewed  BASIC METABOLIC PANEL - Abnormal; Notable for the following:    Potassium 2.9 (*)    Glucose, Bld 138 (*)    All other components within normal limits  CBC WITH DIFFERENTIAL/PLATELET  TROPONIN I    Imaging Review No results found.   Dg Shoulder Left  02/17/2015   CLINICAL DATA:  Left shoulder pain for 3 days, no known injury, initial encounter  EXAM: LEFT SHOULDER - 2+ VIEW  COMPARISON:  None.  FINDINGS: There is no evidence of fracture or dislocation. There is no evidence of arthropathy or other focal bone abnormality. Soft tissues are unremarkable.  IMPRESSION: No acute abnormality noted.   Electronically Signed   By: Inez Catalina M.D.   On: 02/17/2015 11:01    EKG Interpretation   Date/Time:  Monday February 17 2015 10:30:46 EDT Ventricular Rate:  90 PR Interval:  163 QRS Duration: 90 QT  Interval:  392 QTC Calculation: 480 R Axis:   38 Text Interpretation:  Sinus rhythm No significant change since last  tracing Confirmed by Maretta Overdorf  MD, [place] ([id number]) on 02/17/2015 11:28:50 AM      MDM   Final diagnoses:  Left shoulder pain    53yf with L shoulder pain. likely musculoskeletal with such reproducibility. Doubt ACS. Doubt dissection or other emergent process. It has been determined that no acute conditions requiring further emergency intervention are present at this time. The patient has been advised of the diagnosis and plan. I reviewed any labs and imaging including any potential incidental findings. We have discussed signs and symptoms that warrant return to the ED and they are listed in the discharge instructions.    I personally preformed  the services scribed in my presence. The recorded information has been reviewed is accurate. Virgel Manifold, MD.   Virgel Manifold, MD 02/20/15 1001

## 2015-02-18 ENCOUNTER — Ambulatory Visit (HOSPITAL_COMMUNITY): Payer: Commercial Managed Care - HMO

## 2015-02-18 DIAGNOSIS — M5410 Radiculopathy, site unspecified: Secondary | ICD-10-CM

## 2015-02-18 NOTE — Therapy (Addendum)
[place], [place], [address] Phone: [phone]   Fax:  [phone]  Physical Therapy Treatment  Patient Details  Name: Yolanda Gallegos MRN: [id number] Date of Birth: [birth year] Referring Provider:  [name], MD  Encounter Date: 02/18/2015      PT End of Session - 02/18/15 1122    Visit Number 2   Number of Visits 8   Date for PT Re-Evaluation 03/06/15   Authorization Type [employer]   Authorization - Visit Number 2   Authorization - Number of Visits 8   PT Start Time 1108   PT Stop Time 1155   PT Time Calculation (min) 47 min   Activity Tolerance Patient tolerated treatment well   Behavior During Therapy [facility] for tasks assessed/performed      Past Medical History  Diagnosis Date  . Hypertension   . Hyperlipidemia   . COPD (chronic obstructive pulmonary disease)   . Smoker   . Meningioma   . GERD (gastroesophageal reflux disease)   . Allergy   . Tubular adenoma 2015  . Colon polyps 2015  . Multinodular goiter     subclinical hyperthyroidism    Past Surgical History  Procedure Laterality Date  . 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 Abdominal hysterectomy  1999    left oopherectomy  . Breast biopsy Left 2007    benign  . Inner ear surgery Right 2001  . Cesarean section  1982, 1985, 1987    There were no vitals filed for this visit.  Visit Diagnosis:  Radicular low back pain      Subjective Assessment - 02/18/15 1114    Symptoms Pt stated compliance with HEP without questions.  Reported she woke up with Lt shoulder pain, went to ER yesterday with numbness and radicular symptoms.  Lower back pain minimal with no reports of radicular symptoms today.   Currently in Pain? Yes   Pain Score 7    Pain Location Back   Pain Orientation Lower   Pain Descriptors / Indicators Sore   Pain Radiating Towards No radicular symptoms today   Multiple Pain Sites Yes   Pain Score 7   Pain Location Shoulder   Pain Orientation Left   Pain Descriptors / Indicators Sore;Aching            [facility] PT Assessment - 02/18/15 0001    Assessment   Medical Diagnosis Low back pain   Onset Date 01/06/15   Next MD Visit [place] 06/11/2015   Prior Therapy none                   [facility] Adult PT Treatment/Exercise - 02/18/15 0001    Posture/Postural Control   Posture/Postural Control Postural limitations   Postural Limitations Increased lumbar lordosis;Anterior pelvic tilt;Increased thoracic kyphosis   Exercises   Exercises Lumbar   Lumbar Exercises: Stretches   Active Hamstring Stretch 3 reps;30 seconds   Active Hamstring Stretch Limitations supine with rope   Single Knee to Chest Stretch 4 reps;20 seconds   Lower Trunk Rotation 5 reps;10 seconds   Press Ups Limitations   Press Ups Limitations held today due to increased shoulder pain, resume as tolerable   Lumbar Exercises: Standing   Other Standing Lumbar Exercises 3D hip excursion   Lumbar Exercises: Supine   Ab Set 10 reps;5 seconds   AB Set Limitations tactile and verbal cueing   Bent Knee  Raise 10 reps;5 seconds   Bridge 10 reps   Straight Leg Raise 10 reps                  PT Short Term Goals - 02/18/15 1123    PT SHORT TERM GOAL #1   Title I HEP   Status On-going   PT SHORT TERM GOAL #2   Title Pt to be able to sit for 30 minutes in comfort in order to watch a  short  TV show   Status On-going   PT SHORT TERM GOAL #3   Title Pt to be able to stand for 15 minutes in order to complete dishes without increased pain    Status On-going   PT SHORT TERM GOAL #4   Title Sx no further than the knee level    Status On-going           PT Long Term Goals - 02/18/15 1125    PT LONG TERM GOAL #1   Title I in advance HEP   PT LONG TERM GOAL #2   Title Pt to be able to sit for an hour to be able to travel or go out to eat without increased pain   PT LONG TERM GOAL #3    Title Pt to be able to stand for 30 minutes to socialize without increased pain    PT LONG TERM GOAL #4   Title Pt sx to be in hip area only   PT LONG TERM GOAL #5   Title Pain level to be no greater than 3/10               Plan - 02/18/15 1153    Clinical Impression Statement Began session with core stabilization exercises with min cueing to improve core activation, form and technique.  No prone exercises complete this session due to increased Lt shoulder pain at entrance.  Pt given advanced HEP printout to improve gluteal and core strengthening.  Held traction due to no reports of radicular symptoms this session.  Pt reoprted pain resolved at end of session.     PT Next Visit Plan begin stabilization program as well as pelvic traction.  Traction may be done statically with Max initially at 70#         Problem List Patient Active Problem List   Diagnosis Date Noted  . Multinodular goiter   . Adenomatous colon polyp 02/11/2014  . Hypertension   . Hyperlipidemia   . COPD (chronic obstructive pulmonary disease)   . Smoker    [facility]; [place], [place] [address] [phone]  Aldona Lento 02/18/2015, 11:58 AM  [place], [place], [address] Phone: [phone]   Fax:  [phone]     PHYSICAL THERAPY DISCHARGE SUMMARY 01/13/2016 Visits from Start of Care: 2  Current functional level related to goals / functional outcomes: unknown   Remaining deficits: unknown   Education / Equipment: HEP  Plan: Patient agrees to discharge.  Patient goals were not met. Patient is being discharged due to not returning since the last visit.  ?????        Rayetta Humphrey, [place] CLT [phone]

## 2015-02-18 NOTE — Patient Instructions (Signed)
Bridge   Lie back, legs bent. Inhale, pressing hips up. Keeping ribs in, lengthen lower back. Exhale, rolling down along spine from top. Repeat 10 times. Do 1-2  sessions per day.  Copyright  VHI. All rights reserved.   Bent knee raise   Laying on back with both knees flexed tighten stomach muscles and slowly raise up knee like you are marching.  Hold knee in air for 5 seconds and complete 10 times alternating.  Copyright  VHI. All rights reserved.

## 2015-02-20 ENCOUNTER — Ambulatory Visit (HOSPITAL_COMMUNITY): Payer: Commercial Managed Care - HMO | Admitting: Physical Therapy

## 2015-02-21 ENCOUNTER — Ambulatory Visit (AMBULATORY_SURGERY_CENTER): Payer: Self-pay

## 2015-02-21 VITALS — Ht 66.0 in | Wt 270.0 lb

## 2015-02-21 DIAGNOSIS — Z860100 Personal history of colon polyps, unspecified: Secondary | ICD-10-CM

## 2015-02-21 MED ORDER — MOVIPREP 100 G PO SOLR: 1.0000 | Freq: Once | ORAL | Status: DC

## 2015-02-21 NOTE — Progress Notes (Signed)
PT TO BE ONE HOUR SLOT BUT WANTS TO KEEP 4/11   No allergies to eggs or soy No diet/weight loss meds No home oxygen No past problems with anesthesia  Has email  Emmi instructions given for colonoscopy

## 2015-02-24 ENCOUNTER — Telehealth: Payer: Self-pay | Admitting: Internal Medicine

## 2015-02-24 ENCOUNTER — Encounter: Payer: Self-pay | Admitting: Internal Medicine

## 2015-02-25 ENCOUNTER — Ambulatory Visit (HOSPITAL_COMMUNITY): Payer: Commercial Managed Care - HMO | Admitting: Physical Therapy

## 2015-02-25 NOTE — Telephone Encounter (Signed)
Advised patient that we no longer get Moviprep coupons nor do we complete prior authorizations on colonoscopy prep kits (as they do not get covered because they are more expensive to insurance than the golytely). I have given her the option of Miralax prep. She would like this. I have given her verbal instructions over the counter for this and have also mailed her a copy of the instructions to her home address.

## 2015-02-27 ENCOUNTER — Encounter (HOSPITAL_COMMUNITY): Payer: Commercial Managed Care - HMO | Admitting: Physical Therapy

## 2015-02-27 ENCOUNTER — Telehealth: Payer: Self-pay | Admitting: Family Medicine

## 2015-02-27 ENCOUNTER — Ambulatory Visit (INDEPENDENT_AMBULATORY_CARE_PROVIDER_SITE_OTHER): Payer: Commercial Managed Care - HMO | Admitting: Family Medicine

## 2015-02-27 ENCOUNTER — Encounter: Payer: Self-pay | Admitting: Family Medicine

## 2015-02-27 VITALS — BP 146/88 | HR 100 | Temp 97.9°F | Resp 18 | Ht 66.0 in | Wt 269.0 lb

## 2015-02-27 DIAGNOSIS — M25561 Pain in right knee: Secondary | ICD-10-CM | POA: Diagnosis not present

## 2015-02-27 DIAGNOSIS — D329 Benign neoplasm of meninges, unspecified: Secondary | ICD-10-CM | POA: Diagnosis not present

## 2015-02-27 MED ORDER — CYCLOBENZAPRINE HCL 10 MG PO TABS: 10.0000 mg | ORAL_TABLET | Freq: Three times a day (TID) | ORAL | Status: DC | PRN

## 2015-02-27 NOTE — Progress Notes (Signed)
Subjective:    Patient ID: Yolanda Gallegos, female    DOB: [birth year], 54 y.o.   MRN: [id number]  HPI  I saw the patient in November for right knee pain. She received a cortisone injection in her right knee. The cortisone injection helped immensely. The pain has returned. She complains of pain mainly over the medial joint line. It aches and throbs. Is become difficult to walk. She denies any laxity in the joint. She denies any locking or catching in the joint. It is worse after prolonged standing or walking. She would like another cortisone injection. She is also complaining of pain in her neck and shoulder area. She went to the emergency room. X-rays and EKG and lab work were all normal. The pain is worse with abduction of her left shoulder above 90. She is also complaining of aching pain in her neck Past Medical History  Diagnosis Date  . Hypertension   . Hyperlipidemia   . COPD (chronic obstructive pulmonary disease)   . Smoker   . Meningioma   . GERD (gastroesophageal reflux disease)   . Allergy   . Tubular adenoma 2015  . Colon polyps 2015  . Multinodular goiter     subclinical hyperthyroidism   Past Surgical History  Procedure Laterality Date  . 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 Abdominal hysterectomy  1999    left oopherectomy  . Breast biopsy Left 2007    benign  . Inner ear surgery Right 2001  . Cesarean section  1982, 1985, 1987   Current Outpatient Prescriptions on File Prior to Visit  Medication Sig Dispense Refill  . acetaminophen (TYLENOL) 500 MG tablet Take 500 mg by mouth every 6 (six) hours as needed.    Marland Kitchen atorvastatin (LIPITOR) 40 MG tablet TAKE 1 TABLET BY MOUTH EVERY EVENING AT BEDTIME 90 tablet 3  . clotrimazole-betamethasone (LOTRISONE) cream Apply topically 2 (two) times daily. 135 g 3  . fluticasone (FLONASE) 50 MCG/ACT nasal spray SPRAY TWICE IN EACH NOSTRIL EVERY DAY 48 g 3  . hydrochlorothiazide (HYDRODIURIL) 25 MG  tablet Take 1 tablet (25 mg total) by mouth daily. 90 tablet 3  . hydrocortisone (ANUSOL-HC) 2.5 % rectal cream Place 1 application rectally 2 (two) times daily.    Marland Kitchen ibuprofen (ADVIL,MOTRIN) 200 MG tablet Take 400 mg by mouth every 6 (six) hours as needed for headache, mild pain or moderate pain.    Marland Kitchen loratadine (CLARITIN) 10 MG tablet Take 10 mg by mouth daily.    Marland Kitchen losartan (COZAAR) 100 MG tablet TAKE 1 TABLET EVERY DAY 90 tablet 3  . MOVIPREP 100 G SOLR Take 1 kit (200 g total) by mouth once. 1 kit 0  . omeprazole (PRILOSEC) 40 MG capsule Take 1 capsule (40 mg total) by mouth daily. 90 capsule 3  . potassium chloride SA (K-DUR,KLOR-CON) 20 MEQ tablet Take 1 tablet (20 mEq total) by mouth daily. 90 tablet 3  . PRESCRIPTION MEDICATION Proctozone 2.5% BID    . PRESCRIPTION MEDICATION triamclnole 0.10% bid    . terbinafine (LAMISIL) 1 % cream Apply 1 application topically 2 (two) times daily.    . traMADol (ULTRAM) 50 MG tablet Take 1 tablet (50 mg total) by mouth every 6 (six) hours as needed. 15 tablet 0  . triamcinolone ointment (KENALOG) 0.1 % APPLY TWICE A DAY FOR 10 DAYS PRN Rash 454 g 1   No current facility-administered medications on file prior to  visit.   Allergies  Allergen Reactions  . Doxycycline Hives and Shortness Of Breath  . Mobic [Meloxicam]   . Sulfa Antibiotics     Per positive allergy test   History   Social History  . Marital Status: Divorced    Spouse Name: N/A  . Number of Children: N/A  . Years of Education: N/A   Occupational History  . Not on file.   Social History Main Topics  . Smoking status: Former Smoker    Quit date: 01/21/2012  . Smokeless tobacco: Never Used  . Alcohol Use: No  . Drug Use: No  . Sexual Activity: Not on file     Comment: married, son is battling leukemia.   Other Topics Concern  . Not on file   Social History Narrative    Review of Systems  All other systems reviewed and are negative.      Objective:   Physical  Exam  Cardiovascular: Normal rate, regular rhythm and normal heart sounds.   Pulmonary/Chest: Effort normal and breath sounds normal. No respiratory distress. She has no wheezes. She has no rales.  Musculoskeletal:       Left shoulder: She exhibits decreased range of motion, tenderness and pain. She exhibits normal strength.       Right knee: She exhibits decreased range of motion. She exhibits no LCL laxity, normal patellar mobility and normal meniscus. Tenderness found. Medial joint line and lateral joint line tenderness noted. No MCL and no LCL tenderness noted.  Vitals reviewed.         Assessment & Plan:  Right knee pain - Plan: DG Knee Complete 4 Views Right  I believe the patient has osteoarthritis in the right knee. I would like the patient to go get an x-ray to evaluate further. Meanwhile I injected the right knee with a mixture of 2 mL of lidocaine, 2 mL of Marcaine, and 2 mL of 40 mg per mL Kenalog. Patient tolerated the procedure without complications. I believe the pain in her neck and shoulders likely muscle spasms combined with bursitis in the shoulder. I will give the patient Flexeril 10 mg every 8 hours as needed for pain. If no better return for cortisone injection in the left shoulder.

## 2015-02-27 NOTE — Telephone Encounter (Signed)
Patient called states she called [employer] and they told her that the coding was incorrect for lab work done on 12/02/14. I looked at the dx code and it was the Routine general medical examination code. I have gotten the correct codes for the labs that were drawn on 12/02/14. I called and spoke with [facility] at [place] and gave her the updated codes which was I10 and J44.9 for CMP and CBC, for Lipid  Panel E78.5, and for TSH E04.2. She has updated the codes for each lab drawn and will resubmit to patients insurance company.

## 2015-03-01 ENCOUNTER — Ambulatory Visit (HOSPITAL_COMMUNITY)
Admission: RE | Admit: 2015-03-01 | Discharge: 2015-03-01 | Disposition: A | Discharge status: Home / Self Care | Payer: Commercial Managed Care - HMO | Source: Ambulatory Visit | Attending: Family Medicine | Admitting: Family Medicine

## 2015-03-01 DIAGNOSIS — M1711 Unilateral primary osteoarthritis, right knee: Secondary | ICD-10-CM | POA: Insufficient documentation

## 2015-03-01 DIAGNOSIS — M25561 Pain in right knee: Secondary | ICD-10-CM | POA: Diagnosis not present

## 2015-03-03 ENCOUNTER — Encounter: Payer: Self-pay | Admitting: Internal Medicine

## 2015-03-03 ENCOUNTER — Ambulatory Visit (AMBULATORY_SURGERY_CENTER): Payer: Commercial Managed Care - HMO | Admitting: Internal Medicine

## 2015-03-03 VITALS — BP 122/72 | HR 82 | Temp 96.9°F | Resp 25 | Ht 66.0 in | Wt 270.0 lb

## 2015-03-03 DIAGNOSIS — Z86010 Personal history of colonic polyps: Secondary | ICD-10-CM

## 2015-03-03 DIAGNOSIS — J449 Chronic obstructive pulmonary disease, unspecified: Secondary | ICD-10-CM | POA: Diagnosis not present

## 2015-03-03 DIAGNOSIS — D124 Benign neoplasm of descending colon: Secondary | ICD-10-CM

## 2015-03-03 DIAGNOSIS — D120 Benign neoplasm of cecum: Secondary | ICD-10-CM | POA: Diagnosis not present

## 2015-03-03 DIAGNOSIS — M545 Low back pain: Secondary | ICD-10-CM | POA: Diagnosis not present

## 2015-03-03 DIAGNOSIS — D125 Benign neoplasm of sigmoid colon: Secondary | ICD-10-CM | POA: Diagnosis not present

## 2015-03-03 DIAGNOSIS — I10 Essential (primary) hypertension: Secondary | ICD-10-CM | POA: Diagnosis not present

## 2015-03-03 MED ORDER — SODIUM CHLORIDE 0.9 % IV SOLN: 500.0000 mL | INTRAVENOUS | Status: DC

## 2015-03-03 NOTE — Progress Notes (Signed)
Report to PACU, RN, vss, BBS= Clear.  

## 2015-03-03 NOTE — Patient Instructions (Signed)

## 2015-03-03 NOTE — Op Note (Addendum)
[place]  [address] [place], [address]   COLONOSCOPY PROCEDURE REPORT  PATIENT: Yolanda Gallegos, Yolanda Gallegos  MR#: [id number] BIRTHDATE: 1960/12/08 , 53  yrs. old GENDER: female ENDOSCOPIST: Jerene Bears, MD PROCEDURE DATE:  03/03/2015 PROCEDURE:   Colonoscopy with snare polypectomy First Screening Colonoscopy - Avg.  risk and is 50 yrs.  old or older - No.  Prior Negative Screening - Now for repeat screening. N/A  History of Adenoma - Now for follow-up colonoscopy & has been > or = to 3 yrs.  No.  It has been less than 3 yrs since last colonoscopy.  Medical reason. ASA CLASS:   Class III INDICATIONS:Surveillance due to prior colonic neoplasia and PH Colon Adenoma, incomplete colonoscopy Jan 2015 (cecum not intubated). MEDICATIONS: Monitored anesthesia care, Propofol 1000 mg IV, and Lidocaine 200 mg IV  DESCRIPTION OF PROCEDURE:   After the risks benefits and alternatives of the procedure were thoroughly explained, informed consent was obtained.  The digital rectal exam revealed no rectal mass and revealed several skin tags.   The LB [id number] endoscope was introduced through the anus and advanced to the cecum, which was identified by both the appendix and ileocecal valve. No adverse events experienced.   The quality of the prep was (MoviPrep was used) good.  The instrument was then slowly withdrawn as the colon was fully examined.   COLON FINDINGS: The colon was redundant.  Manual abdominal counter-pressure applied in the right lower abdominal toward the midline was used to reach the cecum.   A sessile polyp ranging from 10 to 12 mm in size with a mucous cap was found at the cecum. Polypectomy was performed with a cold snare.  The resection was complete, the polyp tissue was completely retrieved and sent to histology.   Three sessile polyps ranging between 3-88mm in size were found in the descending colon (1) and sigmoid colon (2). Polypectomies were performed  with a cold snare.  The resection was complete, the polyp tissue was completely retrieved and sent to histology.  Retroflexed views revealed internal hemorrhoids. The time to cecum = 9.8 Withdrawal time = 16.3   The scope was withdrawn and the procedure completed. COMPLICATIONS: There were no immediate complications.  ENDOSCOPIC IMPRESSION: 1.   The colon was redundant 2.   Sessile polyp ranging from 10 to 79mm in size was found at the cecum; polypectomies were performed with a cold snare 3.   Three sessile polyps ranging between 3-28mm in size were found in the descending colon and sigmoid colon; polypectomies were performed with a cold snare  RECOMMENDATIONS: 1.  Await pathology results 2.  Timing of repeat colonoscopy will be determined by pathology findings. 3.  You will receive a letter within 1-2 weeks with the results of your biopsy as well as final recommendations.  Please call my office if you have not received a letter after 3 weeks.  eSigned:  Jerene Bears, MD 03/03/2015 11:05 AM   cc: The Patient and Jenna Luo, MD   PATIENT NAME:  Anushree, Dorsi MR#: [id number]

## 2015-03-03 NOTE — Progress Notes (Signed)
Called to room to assist during endoscopic procedure.  Patient ID and intended procedure confirmed with present staff. Received instructions for my participation in the procedure from the performing physician.  

## 2015-03-04 ENCOUNTER — Ambulatory Visit (HOSPITAL_COMMUNITY): Payer: Commercial Managed Care - HMO | Admitting: Physical Therapy

## 2015-03-04 ENCOUNTER — Telehealth: Payer: Self-pay | Admitting: *Deleted

## 2015-03-04 ENCOUNTER — Encounter: Payer: Self-pay | Admitting: *Deleted

## 2015-03-04 NOTE — Telephone Encounter (Signed)
  Follow up Call-  Call back number 03/03/2015 12/14/2013  Post procedure Call Back phone  # [phone] cell [phone]  Permission to leave phone message Yes Yes     Patient questions:  Do you have a fever, pain , or abdominal swelling? No. Pain Score  0 *  Have you tolerated food without any problems? Yes.    Have you been able to return to your normal activities? Yes.    Do you have any questions about your discharge instructions: Diet   No. Medications  No. Follow up visit  No.  Do you have questions or concerns about your Care? No.  Actions: * If pain score is 4 or above: No action needed, pain <4.

## 2015-03-04 NOTE — Telephone Encounter (Signed)
This encounter was created in error - please disregard.

## 2015-03-06 ENCOUNTER — Ambulatory Visit (HOSPITAL_COMMUNITY): Payer: Commercial Managed Care - HMO | Admitting: Physical Therapy

## 2015-03-10 ENCOUNTER — Encounter: Payer: Self-pay | Admitting: Internal Medicine

## 2015-04-30 ENCOUNTER — Telehealth: Payer: Self-pay | Admitting: *Deleted

## 2015-04-30 NOTE — Telephone Encounter (Signed)
Submitted humana referral thru acuity connect for authorization to Dr. 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

## 2015-05-01 ENCOUNTER — Encounter: Payer: Self-pay | Admitting: *Deleted

## 2015-05-01 NOTE — Telephone Encounter (Signed)
This encounter was created in error - please disregard.

## 2015-06-02 DIAGNOSIS — Z86018 Personal history of other benign neoplasm: Secondary | ICD-10-CM | POA: Diagnosis not present

## 2015-06-02 DIAGNOSIS — Z9889 Other specified postprocedural states: Secondary | ICD-10-CM | POA: Diagnosis not present

## 2015-06-02 DIAGNOSIS — H5213 Myopia, bilateral: Secondary | ICD-10-CM | POA: Diagnosis not present

## 2015-06-02 DIAGNOSIS — H5347 Heteronymous bilateral field defects: Secondary | ICD-10-CM | POA: Diagnosis not present

## 2015-07-25 ENCOUNTER — Encounter: Payer: Self-pay | Admitting: Family Medicine

## 2015-07-25 ENCOUNTER — Ambulatory Visit (INDEPENDENT_AMBULATORY_CARE_PROVIDER_SITE_OTHER): Payer: Commercial Managed Care - HMO | Admitting: Family Medicine

## 2015-07-25 VITALS — BP 120/90 | HR 86 | Temp 98.4°F | Resp 20 | Ht 66.0 in | Wt 270.0 lb

## 2015-07-25 DIAGNOSIS — I10 Essential (primary) hypertension: Secondary | ICD-10-CM

## 2015-07-25 DIAGNOSIS — M25561 Pain in right knee: Secondary | ICD-10-CM

## 2015-07-25 LAB — COMPLETE METABOLIC PANEL WITH GFR
ALT: 32 U/L — AB (ref 6–29)
AST: 23 U/L (ref 10–35)
Albumin: 4.3 g/dL (ref 3.6–5.1)
Alkaline Phosphatase: 92 U/L (ref 33–130)
BUN: 12 mg/dL (ref 7–25)
CHLORIDE: 99 mmol/L (ref 98–110)
CO2: 32 mmol/L — ABNORMAL HIGH (ref 20–31)
CREATININE: 0.63 mg/dL (ref 0.50–1.05)
Calcium: 10.2 mg/dL (ref 8.6–10.4)
GFR, Est African American: >89 mL/min (ref >=60)
GFR, Est Non African American: >89 mL/min (ref >=60)
Glucose, Bld: 109 mg/dL — ABNORMAL HIGH (ref 70–99)
Potassium: 3.4 mmol/L — ABNORMAL LOW (ref 3.5–5.3)
Sodium: 142 mmol/L (ref 135–146)
Total Bilirubin: 0.7 mg/dL (ref 0.2–1.2)
Total Protein: 7.4 g/dL (ref 6.1–8.1)

## 2015-07-25 LAB — LIPID PANEL
CHOLESTEROL: 190 mg/dL (ref 125–200)
HDL: 44 mg/dL — ABNORMAL LOW (ref >=46)
LDL Cholesterol: 106 mg/dL (ref <130)
TRIGLYCERIDES: 198 mg/dL — AB (ref <150)
Total CHOL/HDL Ratio: 4.3 Ratio (ref <=5.0)
VLDL: 40 mg/dL — AB (ref <30)

## 2015-07-25 NOTE — Progress Notes (Signed)
Subjective:    Patient ID: Yolanda Gallegos, female    DOB: 1961-06-14, 54 y.o.   MRN: [id number]  HPI  02/27/15 I saw the patient in November for right knee pain. She received a cortisone injection in her right knee. The cortisone injection helped immensely. The pain has returned. She complains of pain mainly over the medial joint line. It aches and throbs. Is become difficult to walk. She denies any laxity in the joint. She denies any locking or catching in the joint. It is worse after prolonged standing or walking. She would like another cortisone injection. She is also complaining of pain in her neck and shoulder area. She went to the emergency room. X-rays and EKG and lab work were all normal. The pain is worse with abduction of her left shoulder above 90. She is also complaining of aching pain in her neck.  At that time, my plan was: I believe the patient has osteoarthritis in the right knee. I would like the patient to go get an x-ray to evaluate further. Meanwhile I injected the right knee with a mixture of 2 mL of lidocaine, 2 mL of Marcaine, and 2 mL of 40 mg per mL Kenalog. Patient tolerated the procedure without complications. I believe the pain in her neck and shoulders likely muscle spasms combined with bursitis in the shoulder. I will give the patient Flexeril 10 mg every 8 hours as needed for pain. If no better return for cortisone injection in the left shoulder.  07/25/15 Patient states that the knee pain did not improve after the injection in April. X-ray did confirm moderate tricompartmental arthritis. She would like to try one additional cortisone shot today. Her pain is located anteriorly in the knee primarily over the medial joint line. She is tender to palpation of the medial joint line. She is also due to recheck her cholesterol. Past Medical History  Diagnosis Date  . Hypertension   . Hyperlipidemia   . COPD (chronic obstructive pulmonary disease)   . Smoker   . Meningioma   .  GERD (gastroesophageal reflux disease)   . Allergy   . Tubular adenoma 2015  . Colon polyps 2015  . Multinodular goiter     subclinical hyperthyroidism  . Legally blind in left eye, as defined in [place]    Past Surgical History  Procedure Laterality Date  . 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 Abdominal hysterectomy  1999    left oopherectomy  . Breast biopsy Left 2007    benign  . Inner ear surgery Right 2001  . Cesarean section  1982, 1985, 1987   Current Outpatient Prescriptions on File Prior to Visit  Medication Sig Dispense Refill  . atorvastatin (LIPITOR) 40 MG tablet TAKE 1 TABLET BY MOUTH EVERY EVENING AT BEDTIME 90 tablet 3  . clotrimazole-betamethasone (LOTRISONE) cream Apply topically 2 (two) times daily. 135 g 3  . fluticasone (FLONASE) 50 MCG/ACT nasal spray SPRAY TWICE IN EACH NOSTRIL EVERY DAY 48 g 3  . hydrochlorothiazide (HYDRODIURIL) 25 MG tablet Take 1 tablet (25 mg total) by mouth daily. 90 tablet 3  . hydrocortisone (ANUSOL-HC) 2.5 % rectal cream Place 1 application rectally 2 (two) times daily.    Marland Kitchen omeprazole (PRILOSEC) 40 MG capsule Take 1 capsule (40 mg total) by mouth daily. 90 capsule 3  . potassium chloride SA (K-DUR,KLOR-CON) 20 MEQ tablet Take 1 tablet (20 mEq total) by mouth daily. 90 tablet  3  . PRESCRIPTION MEDICATION Proctozone 2.5% BID    . losartan (COZAAR) 100 MG tablet TAKE 1 TABLET EVERY DAY 90 tablet 3   No current facility-administered medications on file prior to visit.   Allergies  Allergen Reactions  . Doxycycline Hives and Shortness Of Breath  . Mobic [Meloxicam]     "feeling of paralysis"  . Sulfa Antibiotics     Per positive allergy test   Social History   Social History  . Marital Status: Divorced    Spouse Name: N/A  . Number of Children: N/A  . Years of Education: N/A   Occupational History  . Not on file.   Social History Main Topics  . Smoking status: Former Smoker    Quit date:  01/21/2012  . Smokeless tobacco: Never Used  . Alcohol Use: No  . Drug Use: No  . Sexual Activity: Not on file     Comment: married, son is battling leukemia.   Other Topics Concern  . Not on file   Social History Narrative    Review of Systems  All other systems reviewed and are negative.      Objective:   Physical Exam  Cardiovascular: Normal rate, regular rhythm and normal heart sounds.   Pulmonary/Chest: Effort normal and breath sounds normal. No respiratory distress. She has no wheezes. She has no rales.  Musculoskeletal:       Left shoulder: She exhibits decreased range of motion, tenderness and pain. She exhibits normal strength.       Right knee: She exhibits decreased range of motion. She exhibits no LCL laxity, normal patellar mobility and normal meniscus. Tenderness found. Medial joint line and lateral joint line tenderness noted. No MCL and no LCL tenderness noted.  Vitals reviewed.         Assessment & Plan:  Benign essential HTN - Plan: COMPLETE METABOLIC PANEL WITH GFR, Lipid panel  Right knee pain  Using sterile technique, I injected the right knee with a mixture of 2 mL of lidocaine, 2 mL of Marcaine, and 2 mL of 40 mg per mL Kenalog. The patient tolerated the procedure well without complication. If the knee pain does not improve, I would recommend a referral to an orthopedic surgeon. I do not see any evidence of a meniscal tear RED ligamentous injury to the knee. I will also check her cholesterol while she is here today

## 2015-07-29 ENCOUNTER — Encounter: Payer: Self-pay | Admitting: Family Medicine

## 2015-09-29 ENCOUNTER — Telehealth: Payer: Self-pay | Admitting: Family Medicine

## 2015-09-29 NOTE — Telephone Encounter (Signed)
Pt is calling to request a referral to Dr. Harrison Mons and for an MRI. [phone]

## 2015-10-01 NOTE — Telephone Encounter (Signed)
Pt called back and stated she is needing referral to Dr. Harrison Mons Neurosurgeon at [facility] neurosurgery and 2 yr f/u for MRI of brain. Will Contact this office to get information I need. But has appt with Redmond Pulling on Dec 27.

## 2015-10-01 NOTE — Telephone Encounter (Signed)
lmtrc

## 2015-11-04 NOTE — Telephone Encounter (Signed)
Referral has been submitted, pending authorizatin

## 2015-11-07 ENCOUNTER — Other Ambulatory Visit: Payer: Self-pay | Admitting: Family Medicine

## 2015-11-26 ENCOUNTER — Telehealth: Payer: Self-pay | Admitting: *Deleted

## 2015-11-26 NOTE — Telephone Encounter (Signed)
Submitted humana referral thru acuity connect for authorization on 11/04/15 to Dr. Laurence Ferrari with authorization [id number]  Requesting provider: Flonnie Hailstone  Treating provider: Laurence Ferrari  Number of visits:6  Start Date:11/18/15  End Date:05/16/16  Dx:D32.9- Benign neoplasm of meninges,unspecified

## 2015-12-09 DIAGNOSIS — D329 Benign neoplasm of meninges, unspecified: Secondary | ICD-10-CM | POA: Diagnosis not present

## 2015-12-09 DIAGNOSIS — D4989 Neoplasm of unspecified behavior of other specified sites: Secondary | ICD-10-CM | POA: Diagnosis not present

## 2015-12-09 DIAGNOSIS — Z9889 Other specified postprocedural states: Secondary | ICD-10-CM | POA: Diagnosis not present

## 2015-12-09 DIAGNOSIS — D320 Benign neoplasm of cerebral meninges: Secondary | ICD-10-CM | POA: Diagnosis not present

## 2015-12-09 DIAGNOSIS — H547 Unspecified visual loss: Secondary | ICD-10-CM | POA: Diagnosis not present

## 2015-12-29 ENCOUNTER — Encounter: Payer: Self-pay | Admitting: Family Medicine

## 2015-12-29 ENCOUNTER — Ambulatory Visit (INDEPENDENT_AMBULATORY_CARE_PROVIDER_SITE_OTHER): Payer: Commercial Managed Care - HMO | Admitting: Family Medicine

## 2015-12-29 ENCOUNTER — Other Ambulatory Visit: Payer: Self-pay | Admitting: Family Medicine

## 2015-12-29 VITALS — BP 122/82 | HR 90 | Temp 98.1°F | Resp 18 | Wt 268.0 lb

## 2015-12-29 DIAGNOSIS — R208 Other disturbances of skin sensation: Secondary | ICD-10-CM | POA: Diagnosis not present

## 2015-12-29 DIAGNOSIS — E785 Hyperlipidemia, unspecified: Secondary | ICD-10-CM

## 2015-12-29 DIAGNOSIS — E038 Other specified hypothyroidism: Secondary | ICD-10-CM

## 2015-12-29 DIAGNOSIS — R200 Anesthesia of skin: Secondary | ICD-10-CM

## 2015-12-29 DIAGNOSIS — Z0000 Encounter for general adult medical examination without abnormal findings: Secondary | ICD-10-CM

## 2015-12-29 DIAGNOSIS — Z23 Encounter for immunization: Secondary | ICD-10-CM | POA: Diagnosis not present

## 2015-12-29 DIAGNOSIS — I10 Essential (primary) hypertension: Secondary | ICD-10-CM

## 2015-12-29 DIAGNOSIS — R946 Abnormal results of thyroid function studies: Secondary | ICD-10-CM | POA: Diagnosis not present

## 2015-12-29 LAB — COMPLETE METABOLIC PANEL WITH GFR
ALT: 40 U/L — AB (ref 6–29)
AST: 25 U/L (ref 10–35)
Albumin: 4 g/dL (ref 3.6–5.1)
Alkaline Phosphatase: 80 U/L (ref 33–130)
BUN: 12 mg/dL (ref 7–25)
CHLORIDE: 94 mmol/L — AB (ref 98–110)
CO2: 31 mmol/L (ref 20–31)
CREATININE: 0.57 mg/dL (ref 0.50–1.05)
Calcium: 9.6 mg/dL (ref 8.6–10.4)
GFR, Est African American: >89 mL/min (ref >=60)
GFR, Est Non African American: >89 mL/min (ref >=60)
Glucose, Bld: 300 mg/dL — ABNORMAL HIGH (ref 70–99)
Potassium: 3.3 mmol/L — ABNORMAL LOW (ref 3.5–5.3)
Sodium: 138 mmol/L (ref 135–146)
Total Bilirubin: 0.6 mg/dL (ref 0.2–1.2)
Total Protein: 7.2 g/dL (ref 6.1–8.1)

## 2015-12-29 LAB — CBC WITH DIFFERENTIAL/PLATELET
BASOS ABS: 0 10*3/uL (ref 0.0–0.1)
Basophils Relative: 0 % (ref 0–1)
EOS ABS: 0.3 10*3/uL (ref 0.0–0.7)
EOS PCT: 3 % (ref 0–5)
HCT: 44.6 % (ref 36.0–46.0)
Hemoglobin: 14.8 g/dL (ref 12.0–15.0)
Lymphocytes Relative: 36 % (ref 12–46)
Lymphs Abs: 3.1 10*3/uL (ref 0.7–4.0)
MCH: 28.2 pg (ref 26.0–34.0)
MCHC: 33.2 g/dL (ref 30.0–36.0)
MCV: 85 fL (ref 78.0–100.0)
MPV: 9.8 fL (ref 8.6–12.4)
Monocytes Absolute: 0.8 10*3/uL (ref 0.1–1.0)
Monocytes Relative: 9 % (ref 3–12)
Neutro Abs: 4.5 10*3/uL (ref 1.7–7.7)
Neutrophils Relative %: 52 % (ref 43–77)
PLATELETS: 308 10*3/uL (ref 150–400)
RBC: 5.25 MIL/uL — AB (ref 3.87–5.11)
RDW: 13.5 % (ref 11.5–15.5)
WBC: 8.6 10*3/uL (ref 4.0–10.5)

## 2015-12-29 LAB — LIPID PANEL
Cholesterol: 156 mg/dL (ref 125–200)
HDL: 38 mg/dL — ABNORMAL LOW (ref >=46)
LDL CALC: 87 mg/dL (ref <130)
Total CHOL/HDL Ratio: 4.1 Ratio (ref <=5.0)
Triglycerides: 157 mg/dL — ABNORMAL HIGH (ref <150)
VLDL: 31 mg/dL — AB (ref <30)

## 2015-12-29 LAB — TSH: TSH: 0.01 mIU/L — ABNORMAL LOW

## 2015-12-29 MED ORDER — FLUCONAZOLE 150 MG PO TABS: 150.0000 mg | ORAL_TABLET | Freq: Once | ORAL | Status: DC

## 2015-12-29 NOTE — Progress Notes (Signed)
Subjective:    Patient ID: Yolanda Gallegos, female    DOB: 31-Jan-1961, 55 y.o.   MRN: [id number]  HPI  Patient is a very pleasant 55 year old white female who is here today for complete physical exam. 2014, we discover the patient has a multinodular goiter. She is due to recheck her TSH. She is also due for a mammogram. She is also due for fasting lab work. Patient's pneumonia vaccine, tetanus shot are up-to-date. She is due for a flu shot. Past medical history is significant for hysterectomy and therefore she does not require a Pap smear. Her colonoscopy was performed 2016 and was significant for several precancerous polyps. Her gastroenterologist wanted to see her back in 2 years. She also reports bilateral numbness in both hands that has been gradually worsening over the last month area and left is worse than the right Past Medical History  Diagnosis Date  . Hypertension   . Hyperlipidemia   . COPD (chronic obstructive pulmonary disease) ([place])   . Smoker   . Meningioma ([place])   . GERD (gastroesophageal reflux disease)   . Allergy   . Tubular adenoma 2015  . Colon polyps 2015  . Multinodular goiter     subclinical hyperthyroidism  . Legally blind in left eye, as defined in [place]    Past Surgical History  Procedure Laterality Date  . 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 Abdominal hysterectomy  1999    left oopherectomy  . Breast biopsy Left 2007    benign  . Inner ear surgery Right 2001  . Cesarean section  1982, 1985, 1987   Current Outpatient Prescriptions on File Prior to Visit  Medication Sig Dispense Refill  . atorvastatin (LIPITOR) 40 MG tablet TAKE 1 TABLET EVERY EVENING AT BEDTIME 90 tablet 3  . clotrimazole-betamethasone (LOTRISONE) cream Apply topically 2 (two) times daily. 135 g 3  . fluticasone (FLONASE) 50 MCG/ACT nasal spray SPRAY TWICE IN EACH NOSTRIL EVERY DAY 48 g 3  . hydrochlorothiazide (HYDRODIURIL) 25 MG tablet TAKE 1 TABLET  EVERY DAY 90 tablet 3  . hydrocortisone (ANUSOL-HC) 2.5 % rectal cream Place 1 application rectally 2 (two) times daily.    Marland Kitchen losartan (COZAAR) 100 MG tablet TAKE 1 TABLET EVERY DAY 90 tablet 3  . omeprazole (PRILOSEC) 40 MG capsule TAKE 1 CAPSULE EVERY DAY 90 capsule 3  . potassium chloride SA (K-DUR,KLOR-CON) 20 MEQ tablet TAKE 1 TABLET EVERY DAY 90 tablet 3  . PRESCRIPTION MEDICATION Proctozone 2.5% BID    . ZYRTEC ALLERGY 10 MG tablet Take 10 mg by mouth daily.      No current facility-administered medications on file prior to visit.   Allergies  Allergen Reactions  . Doxycycline Hives and Shortness Of Breath  . Mobic [Meloxicam]     "feeling of paralysis"  . Sulfa Antibiotics     Per positive allergy test   Social History   Social History  . Marital Status: Divorced    Spouse Name: N/A  . Number of Children: N/A  . Years of Education: N/A   Occupational History  . Not on file.   Social History Main Topics  . Smoking status: Former Smoker    Quit date: 01/21/2012  . Smokeless tobacco: Never Used  . Alcohol Use: No  . Drug Use: No  . Sexual Activity: Not on file     Comment: married, son is battling leukemia.   Other Topics Concern  .  Not on file   Social History Narrative      Review of Systems  Cardiovascular: Positive for palpitations.  All other systems reviewed and are negative.      Objective:   Physical Exam  Constitutional: She is oriented to person, place, and time. She appears well-developed and well-nourished. No distress.  HENT:  Head: Normocephalic and atraumatic.  Right Ear: External ear normal.  Left Ear: External ear normal.  Nose: Nose normal.  Mouth/Throat: Oropharynx is clear and moist. No oropharyngeal exudate.  Eyes: Conjunctivae and EOM are normal. Pupils are equal, round, and reactive to light. Right eye exhibits no discharge. Left eye exhibits no discharge. No scleral icterus.  Neck: Normal range of motion. Neck supple. No JVD  present. No tracheal deviation present. No thyromegaly present.  Cardiovascular: Normal rate, regular rhythm, normal heart sounds and intact distal pulses.  Exam reveals no gallop and no friction rub.   No murmur heard. Pulmonary/Chest: Effort normal and breath sounds normal. No stridor. No respiratory distress. She has no wheezes. She has no rales. She exhibits no tenderness.  Abdominal: Soft. Bowel sounds are normal. She exhibits no distension and no mass. There is no tenderness. There is no rebound and no guarding.  Musculoskeletal: Normal range of motion. She exhibits no edema or tenderness.  Lymphadenopathy:    She has no cervical adenopathy.  Neurological: She is alert and oriented to person, place, and time. She has normal reflexes. No cranial nerve deficit. She exhibits normal muscle tone. Coordination normal.  Skin: Skin is warm. No rash noted. She is not diaphoretic. No erythema. No pallor.  Psychiatric: She has a normal mood and affect. Her behavior is normal. Judgment and thought content normal.  Vitals reviewed.         Assessment & Plan:  Benign essential HTN  HLD (hyperlipidemia) - Plan: COMPLETE METABOLIC PANEL WITH GFR, Lipid panel, CBC with Differential/Platelet  Other specified hypothyroidism - Plan: TSH  Need for immunization against influenza - Plan: Flu Vaccine QUAD 36+ mos PF IM (Fluarix & Fluzone Quad PF)  Routine general medical examination at a health care facility  Bilateral hand numbness - Plan: Nerve conduction test  I'll check fasting lab work today including a CBC, CMP, fasting lipid panel, and TSH. I want to make sure that the patient's multinodular goiter has not become toxic. Patient's immunizations are up-to-date except for her flu shot. She will receive that today.  I will also schedule the patient for mammogram. Regular anticipatory guidance was provided including diet exercise and weight loss recommendations.  I will also schedule the patient for a  nerve conduction study to evaluate the numbness in her hands

## 2016-01-01 ENCOUNTER — Other Ambulatory Visit: Payer: Self-pay | Admitting: Family Medicine

## 2016-01-01 ENCOUNTER — Other Ambulatory Visit: Payer: Self-pay

## 2016-01-01 DIAGNOSIS — Z1231 Encounter for screening mammogram for malignant neoplasm of breast: Secondary | ICD-10-CM

## 2016-01-01 DIAGNOSIS — R739 Hyperglycemia, unspecified: Secondary | ICD-10-CM

## 2016-01-01 LAB — T3: T3 TOTAL: 147 ng/dL (ref 76–181)

## 2016-01-01 LAB — T4: T4, Total: 8.3 ug/dL (ref 4.5–12.0)

## 2016-01-05 ENCOUNTER — Other Ambulatory Visit: Payer: Commercial Managed Care - HMO

## 2016-01-05 DIAGNOSIS — R739 Hyperglycemia, unspecified: Secondary | ICD-10-CM

## 2016-01-05 DIAGNOSIS — R7309 Other abnormal glucose: Secondary | ICD-10-CM | POA: Diagnosis not present

## 2016-01-05 LAB — HEMOGLOBIN A1C
Hgb A1c MFr Bld: 11 % — ABNORMAL HIGH (ref <5.7)
Mean Plasma Glucose: 269 mg/dL — ABNORMAL HIGH (ref <117)

## 2016-01-12 ENCOUNTER — Ambulatory Visit (INDEPENDENT_AMBULATORY_CARE_PROVIDER_SITE_OTHER): Payer: Commercial Managed Care - HMO | Admitting: Family Medicine

## 2016-01-12 ENCOUNTER — Encounter: Payer: Self-pay | Admitting: Family Medicine

## 2016-01-12 VITALS — BP 136/72 | HR 92 | Temp 97.9°F | Resp 18 | Ht 66.0 in | Wt 263.0 lb

## 2016-01-12 DIAGNOSIS — E1100 Type 2 diabetes mellitus with hyperosmolarity without nonketotic hyperglycemic-hyperosmolar coma (NKHHC): Secondary | ICD-10-CM

## 2016-01-12 IMAGING — DX DG KNEE COMPLETE 4+V*R*
4 series · 4 of 4 positions shown · non-contrast
Comparison: None.

CLINICAL DATA: Right knee pain for 1 year.  Initial encounter.

EXAM:
RIGHT KNEE - COMPLETE 4+ VIEW

[knee ap]
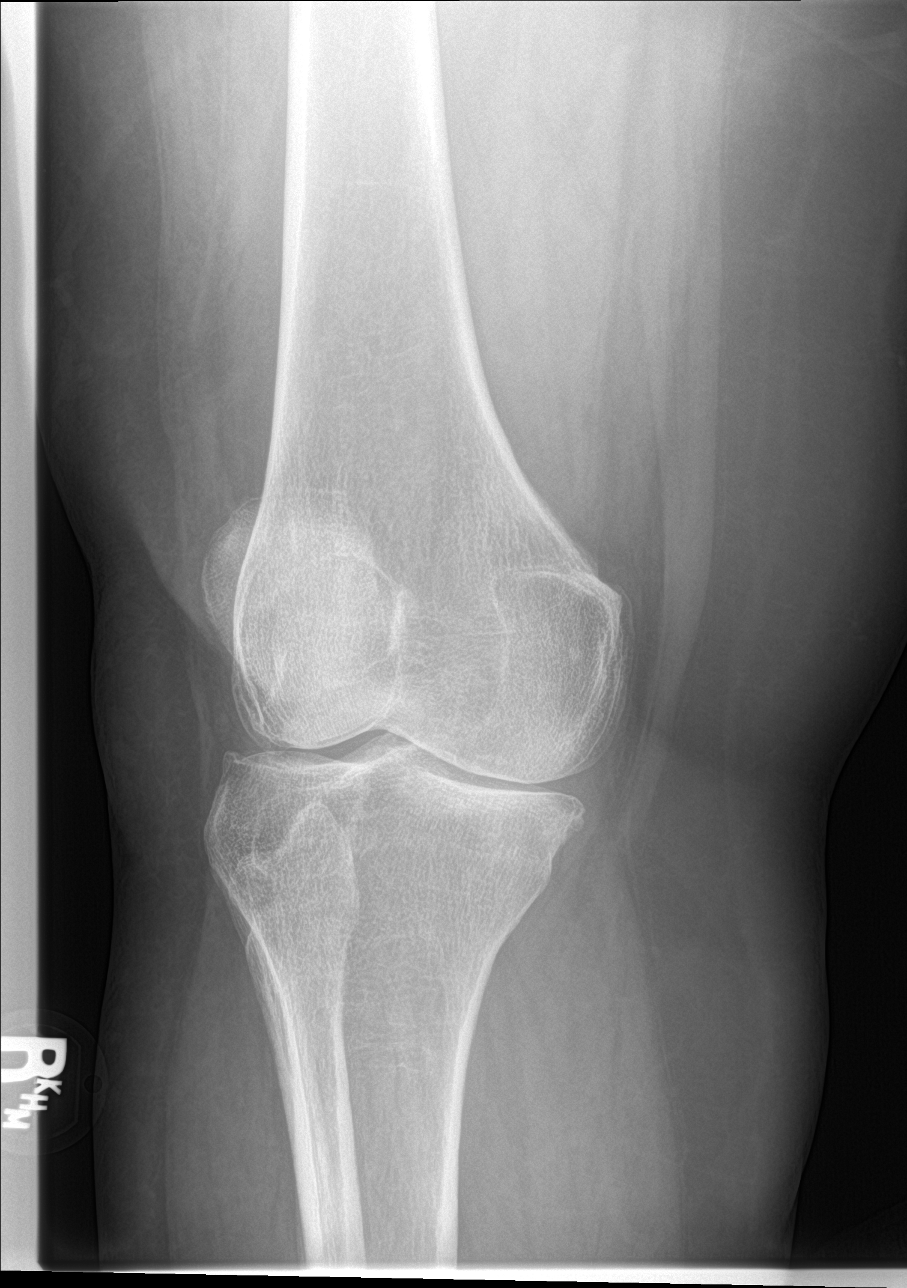

[knee obl (1 of 2)]
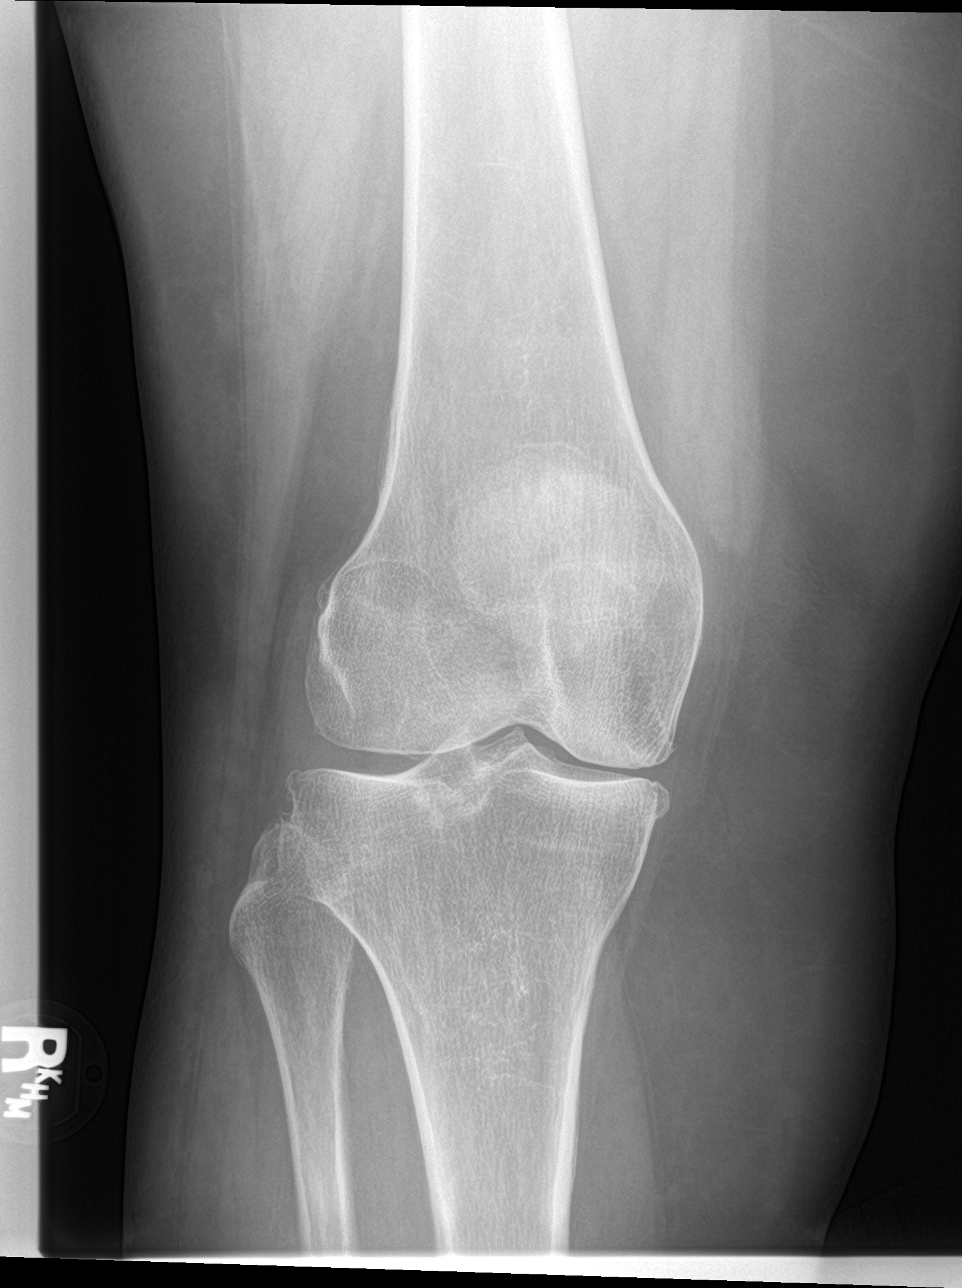

[knee obl (2 of 2)]
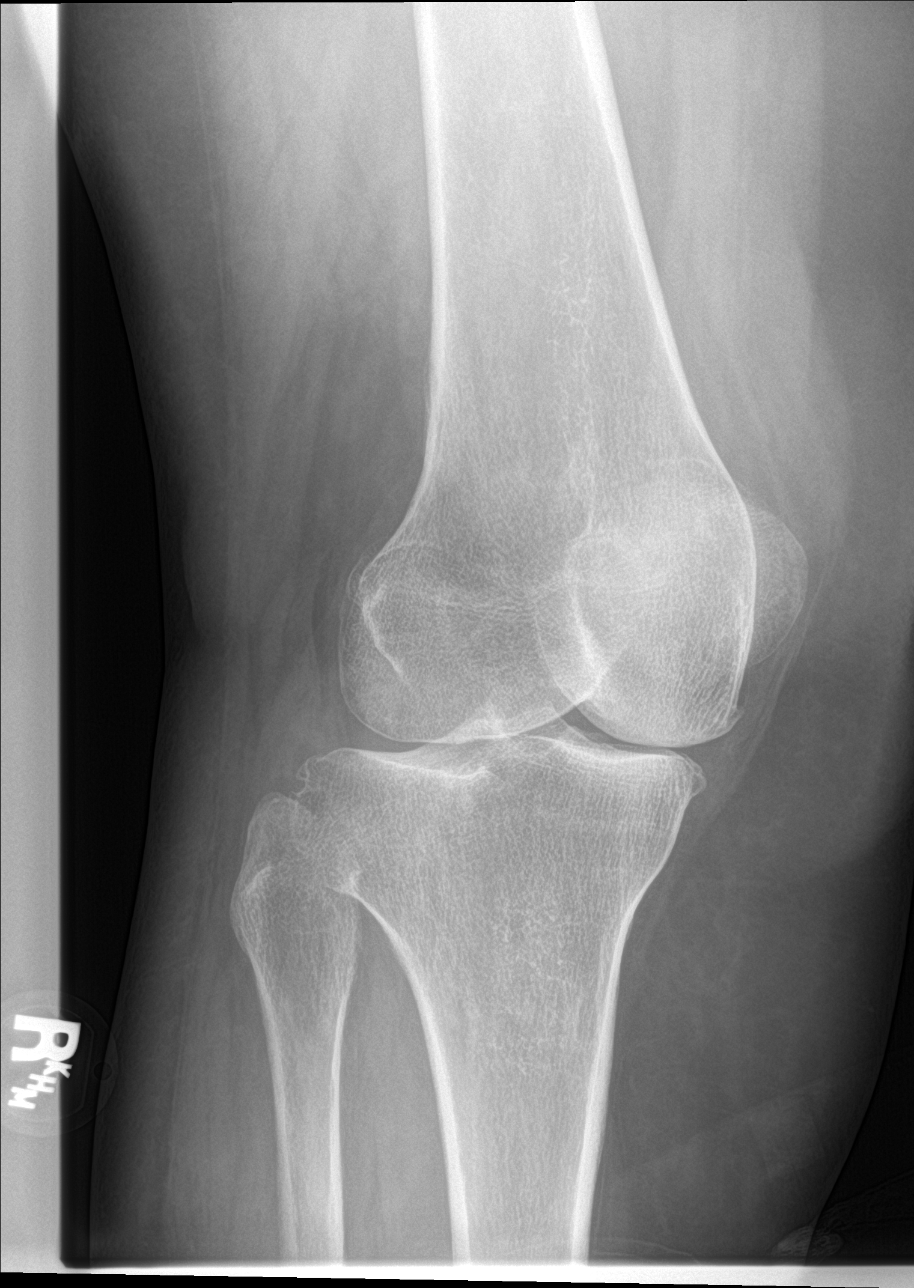

[knee lat]
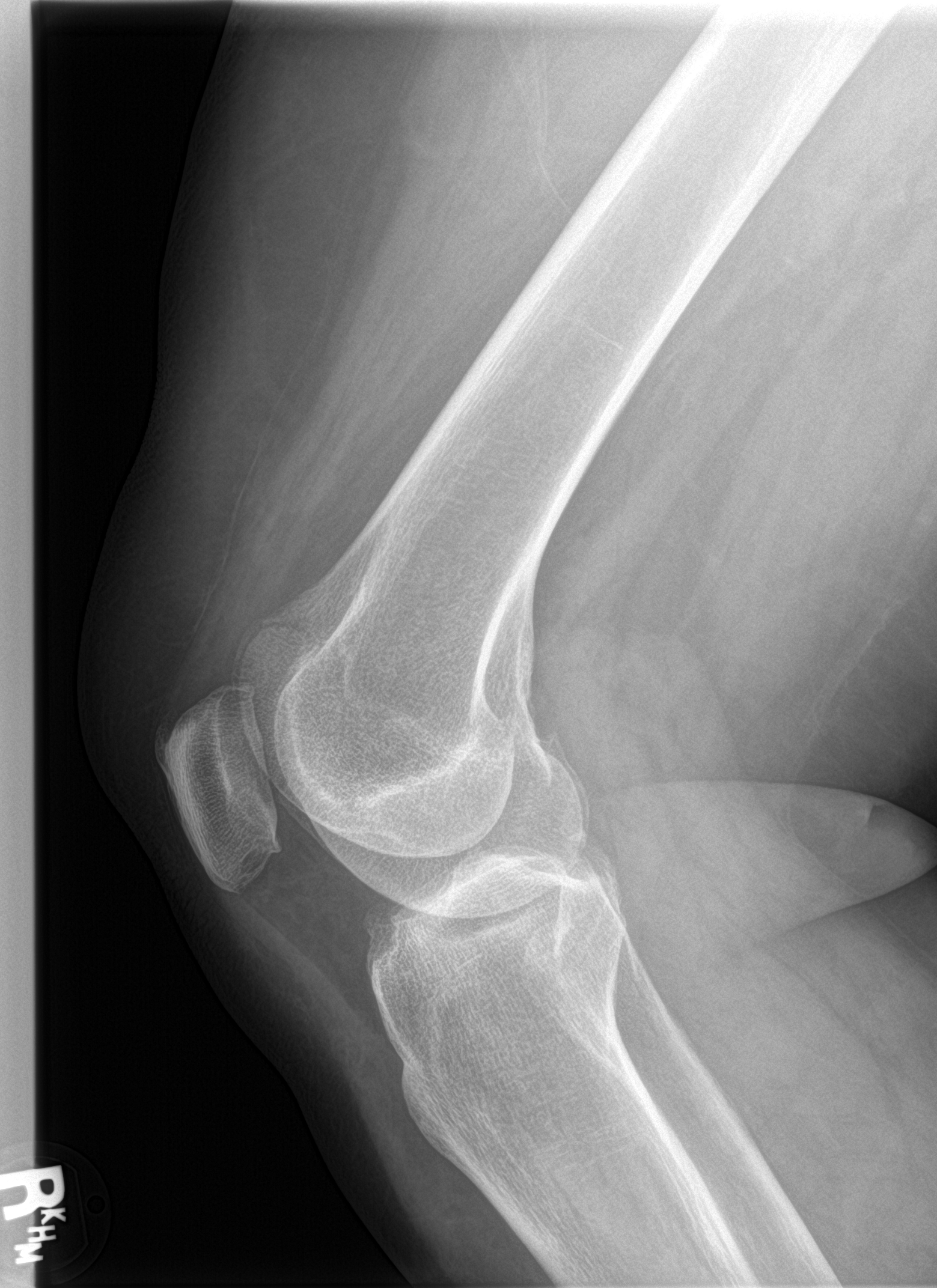

[4 of 4 positions shown; findings below may reference images not displayed]

FINDINGS: There is no evidence of acute fracture, subluxation or dislocation.

Joint space narrowing and mild osteophytosis in all 3 compartments
are identified.

No focal bony lesions are noted.

There is no evidence of joint effusion.
IMPRESSION: Mild -moderate tricompartmental degenerative changes.

No acute abnormalities.

## 2016-01-12 MED ORDER — EMPAGLIFLOZIN 25 MG PO TABS: 25.0000 mg | ORAL_TABLET | Freq: Every day | ORAL | Status: DC

## 2016-01-12 MED ORDER — METFORMIN HCL 500 MG PO TABS: 1000.0000 mg | ORAL_TABLET | Freq: Two times a day (BID) | ORAL | Status: DC

## 2016-01-12 NOTE — Progress Notes (Signed)
Subjective:    Patient ID: Yolanda Gallegos, female    DOB: 04-Oct-1961, 55 y.o.   MRN: [id number]  HPI 12/30/15 Patient is a very pleasant 55 year old white female who is here today for complete physical exam. 2014, we discover the patient has a multinodular goiter. She is due to recheck her TSH. She is also due for a mammogram. She is also due for fasting lab work. Patient's pneumonia vaccine, tetanus shot are up-to-date. She is due for a flu shot. Past medical history is significant for hysterectomy and therefore she does not require a Pap smear. Her colonoscopy was performed 2016 and was significant for several precancerous polyps. Her gastroenterologist wanted to see her back in 2 years. She also reports bilateral numbness in both hands that has been gradually worsening over the last month area and left is worse than the right.  At that time, my plan was: I'll check fasting lab work today including a CBC, CMP, fasting lipid panel, and TSH. I want to make sure that the patient's multinodular goiter has not become toxic. Patient's immunizations are up-to-date except for her flu shot. She will receive that today.  I will also schedule the patient for mammogram. Regular anticipatory guidance was provided including diet exercise and weight loss recommendations.  I will also schedule the patient for a nerve conduction study to evaluate the numbness in her hands  01/12/16 Fasting blood sugar was 300. Hemoglobin A1c was found to be 11.0. She is here today to discuss options for treatment. Past Medical History  Diagnosis Date  . Hypertension   . Hyperlipidemia   . COPD (chronic obstructive pulmonary disease) ([place])   . Smoker   . Meningioma ([place])   . GERD (gastroesophageal reflux disease)   . Allergy   . Tubular adenoma 2015  . Colon polyps 2015  . Multinodular goiter     subclinical hyperthyroidism  . Legally blind in left eye, as defined in [place]   . Diabetes mellitus without complication [facility])     Past Surgical History  Procedure Laterality Date  . 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 Abdominal hysterectomy  1999    left oopherectomy  . Breast biopsy Left 2007    benign  . Inner ear surgery Right 2001  . Cesarean section  1982, 1985, 1987   Current Outpatient Prescriptions on File Prior to Visit  Medication Sig Dispense Refill  . atorvastatin (LIPITOR) 40 MG tablet TAKE 1 TABLET EVERY EVENING AT BEDTIME 90 tablet 3  . clotrimazole-betamethasone (LOTRISONE) cream Apply topically 2 (two) times daily. 135 g 3  . fluticasone (FLONASE) 50 MCG/ACT nasal spray SPRAY TWICE IN EACH NOSTRIL EVERY DAY 48 g 3  . hydrochlorothiazide (HYDRODIURIL) 25 MG tablet TAKE 1 TABLET EVERY DAY 90 tablet 3  . hydrocortisone (ANUSOL-HC) 2.5 % rectal cream Place 1 application rectally 2 (two) times daily.    Marland Kitchen losartan (COZAAR) 100 MG tablet TAKE 1 TABLET EVERY DAY 90 tablet 3  . omeprazole (PRILOSEC) 40 MG capsule TAKE 1 CAPSULE EVERY DAY 90 capsule 3  . potassium chloride SA (K-DUR,KLOR-CON) 20 MEQ tablet TAKE 1 TABLET EVERY DAY 90 tablet 3  . PRESCRIPTION MEDICATION Proctozone 2.5% BID    . ZYRTEC ALLERGY 10 MG tablet Take 10 mg by mouth daily.      No current facility-administered medications on file prior to visit.   Allergies  Allergen Reactions  . Doxycycline Hives and Shortness Of  Breath  . Mobic [Meloxicam]     "feeling of paralysis"  . Sulfa Antibiotics     Per positive allergy test   Social History   Social History  . Marital Status: Divorced    Spouse Name: N/A  . Number of Children: N/A  . Years of Education: N/A   Occupational History  . Not on file.   Social History Main Topics  . Smoking status: Former Smoker    Quit date: 01/21/2012  . Smokeless tobacco: Never Used  . Alcohol Use: No  . Drug Use: No  . Sexual Activity: Not on file     Comment: married, son is battling leukemia.   Other Topics Concern  . Not on file   Social  History Narrative      Review of Systems  Cardiovascular: Positive for palpitations.  All other systems reviewed and are negative.      Objective:   Physical Exam  Constitutional: She is oriented to person, place, and time. She appears well-developed and well-nourished. No distress.  HENT:  Head: Normocephalic and atraumatic.  Right Ear: External ear normal.  Left Ear: External ear normal.  Nose: Nose normal.  Mouth/Throat: Oropharynx is clear and moist. No oropharyngeal exudate.  Eyes: Conjunctivae and EOM are normal. Pupils are equal, round, and reactive to light. Right eye exhibits no discharge. Left eye exhibits no discharge. No scleral icterus.  Neck: Normal range of motion. Neck supple. No JVD present. No tracheal deviation present. No thyromegaly present.  Cardiovascular: Normal rate, regular rhythm, normal heart sounds and intact distal pulses.  Exam reveals no gallop and no friction rub.   No murmur heard. Pulmonary/Chest: Effort normal and breath sounds normal. No stridor. No respiratory distress. She has no wheezes. She has no rales. She exhibits no tenderness.  Abdominal: Soft. Bowel sounds are normal. She exhibits no distension and no mass. There is no tenderness. There is no rebound and no guarding.  Musculoskeletal: Normal range of motion. She exhibits no edema or tenderness.  Lymphadenopathy:    She has no cervical adenopathy.  Neurological: She is alert and oriented to person, place, and time. She has normal reflexes. No cranial nerve deficit. She exhibits normal muscle tone. Coordination normal.  Skin: Skin is warm. No rash noted. She is not diaphoretic. No erythema. No pallor.  Psychiatric: She has a normal mood and affect. Her behavior is normal. Judgment and thought content normal.  Vitals reviewed.         Assessment & Plan:  Uncontrolled type 2 diabetes mellitus with hyperosmolarity without coma, unspecified long term insulin use status (HCC) - Plan:  metFORMIN (GLUCOPHAGE) 500 MG tablet, empagliflozin (JARDIANCE) 25 MG TABS tablet  Begin metformin and gradually increase the patient's 1000 mg by mouth twice a day. We discussed glipizide versus jardiance.  Begin jardiance 25 mg poqday.  Too expensive, I will switch the patient to glipizide extended release 10 mg by mouth every morning. I will have her check her fasting blood sugar every morning and report back to me a follow-up office visit in one month to determine if we need to add additional medication. We also discussed diet exercise and weight loss.

## 2016-01-23 ENCOUNTER — Other Ambulatory Visit: Payer: Self-pay | Admitting: Family Medicine

## 2016-01-23 ENCOUNTER — Telehealth: Payer: Self-pay | Admitting: *Deleted

## 2016-01-23 NOTE — Telephone Encounter (Signed)
pt is scheduled at [facility] on 01/26/16 at 8:30 am for consult of NCS per Raquel Sarna at [facility] pt is aware of appt

## 2016-01-23 NOTE — Telephone Encounter (Signed)
Refill appropriate and filled per protocol. 

## 2016-01-26 DIAGNOSIS — R209 Unspecified disturbances of skin sensation: Secondary | ICD-10-CM | POA: Diagnosis not present

## 2016-01-26 DIAGNOSIS — H5412 Blindness, left eye, low vision right eye: Secondary | ICD-10-CM | POA: Diagnosis not present

## 2016-01-26 DIAGNOSIS — E785 Hyperlipidemia, unspecified: Secondary | ICD-10-CM | POA: Diagnosis not present

## 2016-01-26 DIAGNOSIS — I10 Essential (primary) hypertension: Secondary | ICD-10-CM | POA: Diagnosis not present

## 2016-01-26 DIAGNOSIS — E119 Type 2 diabetes mellitus without complications: Secondary | ICD-10-CM | POA: Diagnosis not present

## 2016-02-16 ENCOUNTER — Ambulatory Visit
Admission: RE | Admit: 2016-02-16 | Discharge: 2016-02-16 | Disposition: A | Discharge status: Home / Self Care | Payer: Commercial Managed Care - HMO | Source: Ambulatory Visit

## 2016-02-16 DIAGNOSIS — Z1231 Encounter for screening mammogram for malignant neoplasm of breast: Secondary | ICD-10-CM | POA: Diagnosis not present

## 2016-02-26 DIAGNOSIS — G5603 Carpal tunnel syndrome, bilateral upper limbs: Secondary | ICD-10-CM | POA: Diagnosis not present

## 2016-02-26 DIAGNOSIS — E1140 Type 2 diabetes mellitus with diabetic neuropathy, unspecified: Secondary | ICD-10-CM | POA: Diagnosis not present

## 2016-02-27 ENCOUNTER — Telehealth: Payer: Self-pay | Admitting: *Deleted

## 2016-02-27 NOTE — Telephone Encounter (Signed)
Submitted [employer] referral thru acuity connect for authorization Dr. Trey Sailors [place],MD with authorization [id number]  Requesting provider: Flonnie Hailstone  Treating provider: Trey Sailors [place],MD  Number of visits:6  Start Date: 02/26/16  End Date: 08/24/16  Dx: R20.8- other disturbance of skin sensation       E11.9- type 2 DM w/o complication       99991111- Essential hypertension

## 2016-03-03 ENCOUNTER — Telehealth: Payer: Self-pay | Admitting: Family Medicine

## 2016-03-03 NOTE — Telephone Encounter (Signed)
Patient called and states that the new medications makes her feel nauseated however if she drinks milk with them she doesn't feel sick.  CB# [phone]

## 2016-03-03 NOTE — Telephone Encounter (Signed)
Called and spoke to pt - she states that as long as she drinks Milk she does not get nauseated with the metformin and Januvia. She does drink 2% milk. She is only taking Metformin 500 mg bid as 1000 mg gives her severe diarrhea. She states that her FBS usually run around 150. Is this ok for her to continue?

## 2016-03-04 NOTE — Telephone Encounter (Signed)
We could stop metformin and jardiance.  AND instead, switch to glyxambi 5/25 poqday.  Does she want to try that instead?  She should not be on Januvia according to her med list.

## 2016-03-04 NOTE — Telephone Encounter (Signed)
LMTRC

## 2016-03-09 NOTE — Telephone Encounter (Signed)
Med list updated

## 2016-03-09 NOTE — Telephone Encounter (Signed)
Pt aware and will stop to get samples to try of Glyxambi

## 2016-03-15 ENCOUNTER — Telehealth: Payer: Self-pay | Admitting: Family Medicine

## 2016-03-15 NOTE — Telephone Encounter (Signed)
Pt left a VM stating that she has had an allergic rxn to the medication that Dr. Dennard Schaumann put her on last week. She did not specify which medication.  Please advise [phone]

## 2016-03-15 NOTE — Telephone Encounter (Signed)
Pt has taken the Glyxambi for 2 days and for 2 days in a row she has experience SOB and feeling like throat was swelling and itching after taking this medication. Nothing that had require a ov to er or uc. She states that she has stopped taking it and only still had the itching after 2 days. What does she need to do now?

## 2016-03-15 NOTE — Telephone Encounter (Signed)
Samples of Glyxami were given to pt last week

## 2016-03-16 NOTE — Telephone Encounter (Signed)
I would suggest starting lantus 10 units SQ daily and increase by 1 unit everyday until fbs are less than 130.  Can she come by for pen education and to discuss how to titrate the insulin.

## 2016-03-16 NOTE — Telephone Encounter (Signed)
Patient aware of providers recommendations. Pt will come in tomorrow for education on how to give the injection.

## 2016-03-17 ENCOUNTER — Telehealth: Payer: Self-pay | Admitting: Family Medicine

## 2016-03-17 MED ORDER — INSULIN PEN NEEDLE 31G X 5 MM MISC: Status: DC

## 2016-03-17 MED ORDER — GLUCOSE BLOOD VI STRP: ORAL_STRIP | Status: DC

## 2016-03-17 MED ORDER — ALCOHOL SWABS 70 % PADS: MEDICATED_PAD | Status: DC

## 2016-03-17 MED ORDER — ACCU-CHEK AVIVA PLUS W/DEVICE KIT: PACK | Status: DC

## 2016-03-17 NOTE — Telephone Encounter (Signed)
Pt came in for diabetic insulin teaching - pt was given Levemir pen and instructed on how to use it per wtp's note and storage. Pt was agreeable with understanding how to give injection and will call us back before pen runs out to get refill until her BS is staying at 130 or below.

## 2016-03-23 ENCOUNTER — Telehealth: Payer: Self-pay | Admitting: Family Medicine

## 2016-03-23 NOTE — Telephone Encounter (Addendum)
Pt is requesting a refill of 300 lances for her accu check monitor [employer] mail order

## 2016-03-24 MED ORDER — ACCU-CHEK SOFT TOUCH LANCETS MISC: Status: DC

## 2016-03-24 NOTE — Telephone Encounter (Signed)
Lancets sent to pharm 

## 2016-04-01 ENCOUNTER — Telehealth: Payer: Self-pay | Admitting: Family Medicine

## 2016-04-01 NOTE — Telephone Encounter (Signed)
Pt's AM blood sugar readings are as follows. She is also inquiring about samples of Levamir. She would like to discuss her readings with a nurse when possible. [phone] 4/27-  157 4/28-  147 4/29-  165 4/30-  151 5/1-    149 5/2-    143 5/3-    140 5/4 -   121 5/5-    151 5/6-    127 5/7-    111 5/8-    139 5/9-    117 5/10-  121 5/11-  140

## 2016-04-02 NOTE — Telephone Encounter (Signed)
Increase basal insulin by 5 units.

## 2016-04-02 NOTE — Telephone Encounter (Signed)
Pt's husband aware and sample given to him.

## 2016-04-02 NOTE — Telephone Encounter (Signed)
Called and spoke to pt - she takes her insulin at 10:30 every morning. If she checks her BS at 6 am it is 100-120 if she waits til 8-8:30 then it is 140-150 - why is that?

## 2016-04-05 ENCOUNTER — Telehealth: Payer: Self-pay | Admitting: Family Medicine

## 2016-04-05 NOTE — Telephone Encounter (Signed)
Pt called to see if Dr. Dennard Schaumann would like to see her in the office some time soon for her regular DM f/u or if he wants her to wait a while until her insulin gets straightened out. Please advise [phone]

## 2016-04-07 NOTE — Telephone Encounter (Signed)
MD please advise

## 2016-04-07 NOTE — Telephone Encounter (Signed)
I'd like to see her back next week and bring with her 1 week worth of fasting am blood sugars and 2 hour post prandial sugars.

## 2016-04-08 NOTE — Telephone Encounter (Signed)
Call placed to patient and patient made aware.   Appointment scheduled.  

## 2016-04-13 ENCOUNTER — Telehealth: Payer: Self-pay | Admitting: Family Medicine

## 2016-04-13 NOTE — Telephone Encounter (Signed)
Pt is requesting samples of Levamir.  Please call [phone]

## 2016-04-14 NOTE — Telephone Encounter (Signed)
Pt aware we have samples

## 2016-04-20 ENCOUNTER — Ambulatory Visit (INDEPENDENT_AMBULATORY_CARE_PROVIDER_SITE_OTHER): Payer: Commercial Managed Care - HMO | Admitting: Family Medicine

## 2016-04-20 ENCOUNTER — Encounter: Payer: Self-pay | Admitting: Family Medicine

## 2016-04-20 VITALS — BP 146/104 | HR 74 | Temp 98.3°F | Resp 84 | Ht 66.0 in | Wt 254.0 lb

## 2016-04-20 DIAGNOSIS — E1100 Type 2 diabetes mellitus with hyperosmolarity without nonketotic hyperglycemic-hyperosmolar coma (NKHHC): Secondary | ICD-10-CM | POA: Diagnosis not present

## 2016-04-20 NOTE — Progress Notes (Signed)
Subjective:    Patient ID: Yolanda Gallegos, female    DOB: Mar 10, 1961, 55 y.o.   MRN: [id number]  HPI Please see recent office visits and lab work. Patient was ultimately started on insulin for her uncontrolled diabetes. She is now on Lantus 25 units subcutaneous daily. Fasting blood sugars are typically between 110 and 130. Two-hour postprandial sugars are between 140 and 170. She sees variation depending on what she eats. She has lost 14 pounds since her last office visit and she is walking 3 miles a day. She is really try to change her lifestyle and her diet. Accordingly her blood sugars have fallen dramatically over the last 2 weeks. Past Medical History  Diagnosis Date  . Hypertension   . Hyperlipidemia   . COPD (chronic obstructive pulmonary disease) ([place])   . Smoker   . Meningioma ([place])   . GERD (gastroesophageal reflux disease)   . Allergy   . Tubular adenoma 2015  . Colon polyps 2015  . Multinodular goiter     subclinical hyperthyroidism  . Legally blind in left eye, as defined in [place]   . Diabetes mellitus without complication [facility])    Past Surgical History  Procedure Laterality Date  . 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 Abdominal hysterectomy  1999    left oopherectomy  . Breast biopsy Left 2007    benign  . Inner ear surgery Right 2001  . Cesarean section  1982, 1985, 1987   Current Outpatient Prescriptions on File Prior to Visit  Medication Sig Dispense Refill  . Alcohol Swabs 70 % PADS Use as directed prn fingerstick / insulin injectioin - DX:E11.9 300 each 4  . atorvastatin (LIPITOR) 40 MG tablet TAKE 1 TABLET EVERY EVENING AT BEDTIME 90 tablet 3  . Blood Glucose Monitoring Suppl (ACCU-CHEK AVIVA PLUS) w/Device KIT Checks BS bid - tid 1 kit 0  . clotrimazole-betamethasone (LOTRISONE) cream Apply topically 2 (two) times daily. 135 g 3  . fluticasone (FLONASE) 50 MCG/ACT nasal spray USE 2 SPRAYS IN EACH NOSTRIL EVERY DAY 48 g 3  .  glucose blood (ACCU-CHEK AVIVA PLUS) test strip Checks BS bid - tid  DX: E11.9 300 each 4  . hydrochlorothiazide (HYDRODIURIL) 25 MG tablet TAKE 1 TABLET EVERY DAY 90 tablet 3  . hydrocortisone (ANUSOL-HC) 2.5 % rectal cream Place 1 application rectally 2 (two) times daily.    . Insulin Pen Needle 31G X 5 MM MISC Injects qam - DX: E11.9 300 each 4  . Lancets (ACCU-CHEK SOFT TOUCH) lancets Use as instructed 300 each 4  . losartan (COZAAR) 100 MG tablet TAKE 1 TABLET EVERY DAY 90 tablet 3  . omeprazole (PRILOSEC) 40 MG capsule TAKE 1 CAPSULE EVERY DAY 90 capsule 3  . potassium chloride SA (K-DUR,KLOR-CON) 20 MEQ tablet TAKE 1 TABLET EVERY DAY 90 tablet 3  . PRESCRIPTION MEDICATION Proctozone 2.5% BID    . triamcinolone ointment (KENALOG) 0.1 % APPLY TWICE A DAY FOR 10 DAYS AS NEEDED FOR RASH 454 g 1  . ZYRTEC ALLERGY 10 MG tablet Take 10 mg by mouth daily.      No current facility-administered medications on file prior to visit.   Allergies  Allergen Reactions  . Doxycycline Hives and Shortness Of Breath  . Mobic [Meloxicam]     "feeling of paralysis"  . Sulfa Antibiotics     Per positive allergy test   Social History   Social History  .  Marital Status: Divorced    Spouse Name: N/A  . Number of Children: N/A  . Years of Education: N/A   Occupational History  . Not on file.   Social History Main Topics  . Smoking status: Former Smoker    Quit date: 01/21/2012  . Smokeless tobacco: Never Used  . Alcohol Use: No  . Drug Use: No  . Sexual Activity: Not on file     Comment: married, son is battling leukemia.   Other Topics Concern  . Not on file   Social History Narrative      Review of Systems  All other systems reviewed and are negative.      Objective:   Physical Exam  Constitutional: She appears well-developed and well-nourished.  Cardiovascular: Normal rate, regular rhythm and normal heart sounds.   Pulmonary/Chest: Effort normal and breath sounds normal. No  respiratory distress. She has no wheezes. She has no rales.  Abdominal: Soft. Bowel sounds are normal. She exhibits no distension. There is no tenderness. There is no rebound.  Vitals reviewed.         Assessment & Plan:  Uncontrolled type 2 diabetes mellitus with hyperosmolarity without coma, unspecified long term insulin use status (HCC)  Blood sugars are much better. In fact there almost at goal. She is also made significant lifestyle changes and is causing substantial weight loss. Therefore I will make no further changes in her basal insulin dosage. I will recheck a hemoglobin A1c in 3 months. I anticipate that her sugars will continue to improve as she loses weight and continues her therapeutic lifestyle changes

## 2016-08-02 ENCOUNTER — Other Ambulatory Visit: Payer: Self-pay | Admitting: Family Medicine

## 2016-08-02 ENCOUNTER — Ambulatory Visit (INDEPENDENT_AMBULATORY_CARE_PROVIDER_SITE_OTHER): Payer: Commercial Managed Care - HMO | Admitting: Family Medicine

## 2016-08-02 VITALS — BP 134/82 | HR 82 | Temp 97.9°F | Resp 18 | Ht 66.0 in | Wt 260.0 lb

## 2016-08-02 DIAGNOSIS — Z1159 Encounter for screening for other viral diseases: Secondary | ICD-10-CM | POA: Diagnosis not present

## 2016-08-02 DIAGNOSIS — E1100 Type 2 diabetes mellitus with hyperosmolarity without nonketotic hyperglycemic-hyperosmolar coma (NKHHC): Secondary | ICD-10-CM

## 2016-08-02 DIAGNOSIS — Z23 Encounter for immunization: Secondary | ICD-10-CM

## 2016-08-02 DIAGNOSIS — I10 Essential (primary) hypertension: Secondary | ICD-10-CM

## 2016-08-02 DIAGNOSIS — E785 Hyperlipidemia, unspecified: Secondary | ICD-10-CM

## 2016-08-02 LAB — CBC WITH DIFFERENTIAL/PLATELET
BASOS ABS: 0 {cells}/uL (ref 0–200)
Basophils Relative: 0 %
EOS PCT: 2 %
Eosinophils Absolute: 196 cells/uL (ref 15–500)
HEMATOCRIT: 40 % (ref 35.0–45.0)
HEMOGLOBIN: 13.5 g/dL (ref 12.0–15.0)
LYMPHS ABS: 3528 {cells}/uL (ref 850–3900)
Lymphocytes Relative: 36 %
MCH: 29 pg (ref 27.0–33.0)
MCHC: 33.8 g/dL (ref 32.0–36.0)
MCV: 86 fL (ref 80.0–100.0)
MONO ABS: 882 {cells}/uL (ref 200–950)
MPV: 9.4 fL (ref 7.5–12.5)
Monocytes Relative: 9 %
NEUTROS ABS: 5194 {cells}/uL (ref 1500–7800)
Neutrophils Relative %: 53 %
Platelets: 295 10*3/uL (ref 140–400)
RBC: 4.65 MIL/uL (ref 3.80–5.10)
RDW: 13.9 % (ref 11.0–15.0)
WBC: 9.8 10*3/uL (ref 3.8–10.8)

## 2016-08-02 LAB — COMPLETE METABOLIC PANEL WITH GFR
ALBUMIN: 4.2 g/dL (ref 3.6–5.1)
ALK PHOS: 80 U/L (ref 33–130)
ALT: 20 U/L (ref 6–29)
AST: 16 U/L (ref 10–35)
BILIRUBIN TOTAL: 0.7 mg/dL (ref 0.2–1.2)
BUN: 10 mg/dL (ref 7–25)
CO2: 30 mmol/L (ref 20–31)
Calcium: 9.8 mg/dL (ref 8.6–10.4)
Chloride: 99 mmol/L (ref 98–110)
Creat: 0.55 mg/dL (ref 0.50–1.05)
GFR, Est African American: >89 mL/min (ref >=60)
GFR, Est Non African American: >89 mL/min (ref >=60)
GLUCOSE: 120 mg/dL — AB (ref 70–99)
Potassium: 3.3 mmol/L — ABNORMAL LOW (ref 3.5–5.3)
SODIUM: 143 mmol/L (ref 135–146)
TOTAL PROTEIN: 6.8 g/dL (ref 6.1–8.1)

## 2016-08-02 LAB — LIPID PANEL
CHOLESTEROL: 166 mg/dL (ref 125–200)
HDL: 42 mg/dL — ABNORMAL LOW (ref >=46)
LDL CALC: 94 mg/dL (ref <130)
TRIGLYCERIDES: 152 mg/dL — AB (ref <150)
Total CHOL/HDL Ratio: 4 Ratio (ref <=5.0)
VLDL: 30 mg/dL (ref <30)

## 2016-08-02 LAB — HEPATITIS C ANTIBODY: HCV Ab: NEGATIVE

## 2016-08-02 NOTE — Addendum Note (Signed)
Addended by: Shary Decamp B on: 08/02/2016 11:09 AM   Modules accepted: Orders

## 2016-08-02 NOTE — Progress Notes (Signed)
Subjective:    Patient ID: Yolanda Gallegos, female    DOB: [birth year], 55 y.o.   MRN: [id number]  HPI  04/20/16 Please see recent office visits and lab work. Patient was ultimately started on insulin for her uncontrolled diabetes. She is now on Lantus 25 units subcutaneous daily. Fasting blood sugars are typically between 110 and 130. Two-hour postprandial sugars are between 140 and 170. She sees variation depending on what she eats. She has lost 14 pounds since her last office visit and she is walking 3 miles a day. She is really try to change her lifestyle and her diet. Accordingly her blood sugars have fallen dramatically over the last 2 weeks.  At that time, my plan was:  Blood sugars are much better. In fact there almost at goal. She is also made significant lifestyle changes and is causing substantial weight loss. Therefore I will make no further changes in her basal insulin dosage. I will recheck a hemoglobin A1c in 3 months. I anticipate that her sugars will continue to improve as she loses weight and continues her therapeutic lifestyle changes  08/02/16 Patient is due for a flu shot along with her pneumonia shot. She is due for diabetic eye exam however she is legally blind and therefore I have not recommended this. She is due for diabetic foot exam. She denies any numbness or tingling in her feet. She denies any burning pain in her feet. She is due for urine microalbumin and a follow-up hemoglobin A1c. She denies any polyuria, polydipsia, or blurred vision. Fasting blood sugars are between 101 120. She denies any hypoglycemic episodes. She is not checking her sugars regularly. She's gained 6 pounds since starting insulin. She has failed metformin, jardiance, and glyxambi.   Past Medical History:  Diagnosis Date  . Allergy   . Colon polyps 2015  . COPD (chronic obstructive pulmonary disease) ([place])   . Diabetes mellitus without complication ([place])   . GERD (gastroesophageal reflux disease)     . Hyperlipidemia   . Hypertension   . Legally blind in left eye, as defined in [place]   . Meningioma ([place])   . Multinodular goiter    subclinical hyperthyroidism  . Smoker   . Tubular adenoma 2015   Past Surgical History:  Procedure Laterality Date  . ABDOMINAL HYSTERECTOMY  1999   left oopherectomy  . 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 BREAST BIOPSY Left 2007   benign  . [place]  . INNER EAR SURGERY Right 2001   Current Outpatient Prescriptions on File Prior to Visit  Medication Sig Dispense Refill  . Alcohol Swabs 70 % PADS Use as directed prn fingerstick / insulin injectioin - DX:E11.9 300 each 4  . atorvastatin (LIPITOR) 40 MG tablet TAKE 1 TABLET EVERY EVENING AT BEDTIME 90 tablet 3  . Blood Glucose Monitoring Suppl (ACCU-CHEK AVIVA PLUS) w/Device KIT Checks BS bid - tid 1 kit 0  . fluticasone (FLONASE) 50 MCG/ACT nasal spray USE 2 SPRAYS IN EACH NOSTRIL EVERY DAY 48 g 3  . glucose blood (ACCU-CHEK AVIVA PLUS) test strip Checks BS bid - tid  DX: E11.9 300 each 4  . hydrochlorothiazide (HYDRODIURIL) 25 MG tablet TAKE 1 TABLET EVERY DAY 90 tablet 3  . hydrocortisone (ANUSOL-HC) 2.5 % rectal cream Place 1 application rectally 2 (two) times daily.    . insulin glargine (LANTUS) 100 UNIT/ML injection Inject 25 Units into the  skin every morning.    . Insulin Pen Needle 31G X 5 MM MISC Injects qam - DX: E11.9 300 each 4  . Lancets (ACCU-CHEK SOFT TOUCH) lancets Use as instructed 300 each 4  . losartan (COZAAR) 100 MG tablet TAKE 1 TABLET EVERY DAY 90 tablet 3  . omeprazole (PRILOSEC) 40 MG capsule TAKE 1 CAPSULE EVERY DAY 90 capsule 3  . potassium chloride SA (K-DUR,KLOR-CON) 20 MEQ tablet TAKE 1 TABLET EVERY DAY 90 tablet 3  . PRESCRIPTION MEDICATION Proctozone 2.5% BID    . triamcinolone ointment (KENALOG) 0.1 % APPLY TWICE A DAY FOR 10 DAYS AS NEEDED FOR RASH 454 g 1  . ZYRTEC ALLERGY 10 MG tablet Take 10 mg by mouth  daily.      No current facility-administered medications on file prior to visit.    Allergies  Allergen Reactions  . Doxycycline Hives and Shortness Of Breath  . Mobic [Meloxicam]     "feeling of paralysis"  . Sulfa Antibiotics     Per positive allergy test   Social History   Social History  . Marital status: Divorced    Spouse name: N/A  . Number of children: N/A  . Years of education: N/A   Occupational History  . Not on file.   Social History Main Topics  . Smoking status: Former Smoker    Quit date: 01/21/2012  . Smokeless tobacco: Never Used  . Alcohol use No  . Drug use: No  . Sexual activity: Not on file     Comment: married, son is battling leukemia.   Other Topics Concern  . Not on file   Social History Narrative  . No narrative on file      Review of Systems  All other systems reviewed and are negative.      Objective:   Physical Exam  Constitutional: She appears well-developed and well-nourished.  Cardiovascular: Normal rate, regular rhythm and normal heart sounds.   Pulmonary/Chest: Effort normal and breath sounds normal. No respiratory distress. She has no wheezes. She has no rales.  Abdominal: Soft. Bowel sounds are normal. She exhibits no distension. There is no tenderness. There is no rebound.  Vitals reviewed.         Assessment & Plan:  Uncontrolled type 2 diabetes mellitus with hyperosmolarity without coma, unspecified long term insulin use status ([place]) - Plan: CBC with Differential/Platelet, COMPLETE METABOLIC PANEL WITH GFR, Hemoglobin A1c, Lipid panel, Microalbumin, urine, Hepatitis C Ab Reflex HCV RNA, QUANT  HLD (hyperlipidemia)  Benign essential HTN  Need for hepatitis C screening test - Plan: Hepatitis C Ab Reflex HCV RNA, QUANT  Her blood pressure is excellent. I will make no changes in her medication. I will check a fasting lipid panel. Goal LDL cholesterol is less than 100. I will check a hemoglobin A1c. Goal hemoglobin  A1c is less than 7. I will also screen the patient for hepatitis C. Diabetic foot exam is performed today and is normal. Due to the fact she is blind I would not recommend an eye exam. Flu shot and pneumonia vaccine were updated today

## 2016-08-03 LAB — MICROALBUMIN, URINE: MICROALB UR: 0.5 mg/dL

## 2016-08-03 LAB — HEMOGLOBIN A1C
HEMOGLOBIN A1C: 6.2 % — AB (ref <5.7)
Mean Plasma Glucose: 131 mg/dL

## 2016-08-30 DIAGNOSIS — H468 Other optic neuritis: Secondary | ICD-10-CM | POA: Diagnosis not present

## 2016-08-30 DIAGNOSIS — H2513 Age-related nuclear cataract, bilateral: Secondary | ICD-10-CM | POA: Diagnosis not present

## 2016-08-30 DIAGNOSIS — E785 Hyperlipidemia, unspecified: Secondary | ICD-10-CM | POA: Diagnosis not present

## 2016-08-30 DIAGNOSIS — Z79899 Other long term (current) drug therapy: Secondary | ICD-10-CM | POA: Diagnosis not present

## 2016-08-30 DIAGNOSIS — H3589 Other specified retinal disorders: Secondary | ICD-10-CM | POA: Diagnosis not present

## 2016-08-30 DIAGNOSIS — I10 Essential (primary) hypertension: Secondary | ICD-10-CM | POA: Diagnosis not present

## 2016-08-30 DIAGNOSIS — Z86018 Personal history of other benign neoplasm: Secondary | ICD-10-CM | POA: Diagnosis not present

## 2016-08-30 DIAGNOSIS — Z9889 Other specified postprocedural states: Secondary | ICD-10-CM | POA: Diagnosis not present

## 2016-08-30 DIAGNOSIS — H5213 Myopia, bilateral: Secondary | ICD-10-CM | POA: Diagnosis not present

## 2016-08-30 DIAGNOSIS — H5347 Heteronymous bilateral field defects: Secondary | ICD-10-CM | POA: Diagnosis not present

## 2016-09-03 ENCOUNTER — Telehealth: Payer: Self-pay | Admitting: Family Medicine

## 2016-09-03 NOTE — Telephone Encounter (Signed)
Pt called ans states that you had discussed her starting Trulicity at her LOV - I do not see that in the note. Please advise and if she is to start this does she need to come off her insulin???

## 2016-09-06 NOTE — Telephone Encounter (Signed)
A1c is actually very good.  She could stop insulin and try replacing with trulicity .75 mg per week for 2 weeks then 1.5 mg SQ q week thereafter and recheck in 3 months.

## 2016-09-08 NOTE — Telephone Encounter (Signed)
Patient aware of providers recommendations. And Sample in [place] for Trulicity.

## 2016-10-26 ENCOUNTER — Encounter: Payer: Self-pay | Admitting: Family Medicine

## 2016-11-10 ENCOUNTER — Other Ambulatory Visit: Payer: Self-pay | Admitting: Family Medicine

## 2016-11-10 MED ORDER — DULAGLUTIDE 0.75 MG/0.5ML ~~LOC~~ SOAJ: SUBCUTANEOUS | 4 refills | Status: DC

## 2016-12-13 ENCOUNTER — Encounter: Payer: Self-pay | Admitting: *Deleted

## 2016-12-27 ENCOUNTER — Encounter: Payer: Self-pay | Admitting: Internal Medicine

## 2017-02-14 ENCOUNTER — Encounter: Payer: Self-pay | Admitting: Family Medicine

## 2017-02-14 ENCOUNTER — Other Ambulatory Visit: Payer: Self-pay | Admitting: Family Medicine

## 2017-02-14 ENCOUNTER — Ambulatory Visit (INDEPENDENT_AMBULATORY_CARE_PROVIDER_SITE_OTHER): Payer: Medicare HMO | Admitting: Family Medicine

## 2017-02-14 VITALS — BP 136/90 | HR 88 | Temp 98.2°F | Resp 18 | Ht 66.0 in | Wt 258.0 lb

## 2017-02-14 DIAGNOSIS — I10 Essential (primary) hypertension: Secondary | ICD-10-CM | POA: Diagnosis not present

## 2017-02-14 DIAGNOSIS — R55 Syncope and collapse: Secondary | ICD-10-CM | POA: Diagnosis not present

## 2017-02-14 DIAGNOSIS — I499 Cardiac arrhythmia, unspecified: Secondary | ICD-10-CM

## 2017-02-14 DIAGNOSIS — Z0000 Encounter for general adult medical examination without abnormal findings: Secondary | ICD-10-CM | POA: Diagnosis not present

## 2017-02-14 DIAGNOSIS — E1100 Type 2 diabetes mellitus with hyperosmolarity without nonketotic hyperglycemic-hyperosmolar coma (NKHHC): Secondary | ICD-10-CM

## 2017-02-14 DIAGNOSIS — E7800 Pure hypercholesterolemia, unspecified: Secondary | ICD-10-CM | POA: Diagnosis not present

## 2017-02-14 DIAGNOSIS — R946 Abnormal results of thyroid function studies: Secondary | ICD-10-CM | POA: Diagnosis not present

## 2017-02-14 LAB — CBC WITH DIFFERENTIAL/PLATELET
BASOS ABS: 0 {cells}/uL (ref 0–200)
Basophils Relative: 0 %
EOS ABS: 216 {cells}/uL (ref 15–500)
Eosinophils Relative: 2 %
HEMATOCRIT: 40.7 % (ref 35.0–45.0)
HEMOGLOBIN: 13.6 g/dL (ref 12.0–15.0)
LYMPHS ABS: 3456 {cells}/uL (ref 850–3900)
Lymphocytes Relative: 32 %
MCH: 28.3 pg (ref 27.0–33.0)
MCHC: 33.4 g/dL (ref 32.0–36.0)
MCV: 84.8 fL (ref 80.0–100.0)
MPV: 9.4 fL (ref 7.5–12.5)
Monocytes Absolute: 756 cells/uL (ref 200–950)
Monocytes Relative: 7 %
NEUTROS ABS: 6372 {cells}/uL (ref 1500–7800)
Neutrophils Relative %: 59 %
Platelets: 320 10*3/uL (ref 140–400)
RBC: 4.8 MIL/uL (ref 3.80–5.10)
RDW: 14.2 % (ref 11.0–15.0)
WBC: 10.8 10*3/uL (ref 3.8–10.8)

## 2017-02-14 LAB — TSH: TSH: 0.03 mIU/L — ABNORMAL LOW

## 2017-02-14 MED ORDER — METOPROLOL SUCCINATE ER 25 MG PO TB24: 25.0000 mg | ORAL_TABLET | Freq: Every day | ORAL | 3 refills | Status: DC

## 2017-02-14 NOTE — Progress Notes (Signed)
Subjective:    Patient ID: Yolanda Gallegos, female    DOB: 14-Feb-1961, 56 y.o.   MRN: [id number]  Medication Refill     04/20/16 Please see recent office visits and lab work. Patient was ultimately started on insulin for her uncontrolled diabetes. She is now on Lantus 25 units subcutaneous daily. Fasting blood sugars are typically between 110 and 130. Two-hour postprandial sugars are between 140 and 170. She sees variation depending on what she eats. She has lost 14 pounds since her last office visit and she is walking 3 miles a day. She is really try to change her lifestyle and her diet. Accordingly her blood sugars have fallen dramatically over the last 2 weeks.  At that time, my plan was:  Blood sugars are much better. In fact there almost at goal. She is also made significant lifestyle changes and is causing substantial weight loss. Therefore I will make no further changes in her basal insulin dosage. I will recheck a hemoglobin A1c in 3 months. I anticipate that her sugars will continue to improve as she loses weight and continues her therapeutic lifestyle changes  08/02/16 Patient is due for a flu shot along with her pneumonia shot. She is due for diabetic eye exam however she is legally blind and therefore I have not recommended this. She is due for diabetic foot exam. She denies any numbness or tingling in her feet. She denies any burning pain in her feet. She is due for urine microalbumin and a follow-up hemoglobin A1c. She denies any polyuria, polydipsia, or blurred vision. Fasting blood sugars are between 101 120. She denies any hypoglycemic episodes. She is not checking her sugars regularly. She's gained 6 pounds since starting insulin. She has failed metformin, jardiance, and glyxambi.  At that time, my plan was: Her blood pressure is excellent. I will make no changes in her medication. I will check a fasting lipid panel. Goal LDL cholesterol is less than 100. I will check a hemoglobin A1c.  Goal hemoglobin A1c is less than 7. I will also screen the patient for hepatitis C. Diabetic foot exam is performed today and is normal. Due to the fact she is blind I would not recommend an eye exam. Flu shot and pneumonia vaccine were updated today  02/14/17 Patient has done very well on trulicity.  In fact, she had to reduce trulicity  To 2.48 m per week due to hypoglycemia.  Unfortunately, her weight is essentially the same. She has not lost any substantial weight.  He is due for mammogram which she schedules. Her last colonoscopy was in 2016 and her gastroenterologist wanted her to come back in 2 years to the fascia 3 tubular adenomas. They've already tried contacting her and I encouraged her to make an appointment with them. She is due today for fasting lab work regarding her physical. Her immunizations are up-to-date including Pneumovax 23, Prevnar 13, and her tetanus shot. However she reports palpitations and presyncope. She states that this is becoming more frequent over the last few weeks. She'll follow a fluttering sensation in her chest. Her chest tube warm. She will then start to feel like she is going to pass out. Symptoms last seconds to minutes and resolve spontaneously. On exam today she demonstrates tachycardia with an irregularly, irregular rhythm Past Medical History:  Diagnosis Date  . Allergy   . Colon polyps 2015  . COPD (chronic obstructive pulmonary disease) ([place])   . Diabetes mellitus without complication ([place])   .  GERD (gastroesophageal reflux disease)   . Hyperlipidemia   . Hypertension   . Legally blind in left eye, as defined in [place]   . Meningioma ([place])   . Multinodular goiter    subclinical hyperthyroidism  . Smoker   . Tubular adenoma 2015   Past Surgical History:  Procedure Laterality Date  . ABDOMINAL HYSTERECTOMY  1999   left oopherectomy  . 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 BREAST BIOPSY Left 2007   benign  . [place]  . INNER EAR SURGERY Right 2001   Current Outpatient Prescriptions on File Prior to Visit  Medication Sig Dispense Refill  . Alcohol Swabs 70 % PADS Use as directed prn fingerstick / insulin injectioin - DX:E11.9 300 each 4  . atorvastatin (LIPITOR) 40 MG tablet TAKE 1 TABLET EVERY EVENING AT BEDTIME 90 tablet 3  . Blood Glucose Monitoring Suppl (ACCU-CHEK AVIVA PLUS) w/Device KIT Checks BS bid - tid 1 kit 0  . Dulaglutide (TRULICITY) 1.30 QM/5.7QI SOPN Inject SQ weekly 12 pen 4  . fluticasone (FLONASE) 50 MCG/ACT nasal spray USE 2 SPRAYS IN EACH NOSTRIL EVERY DAY 48 g 3  . glucose blood (ACCU-CHEK AVIVA PLUS) test strip Checks BS bid - tid  DX: E11.9 300 each 4  . hydrochlorothiazide (HYDRODIURIL) 25 MG tablet TAKE 1 TABLET EVERY DAY 90 tablet 3  . hydrocortisone (ANUSOL-HC) 2.5 % rectal cream Place 1 application rectally 2 (two) times daily.    . Insulin Pen Needle 31G X 5 MM MISC Injects qam - DX: E11.9 300 each 4  . Lancets (ACCU-CHEK SOFT TOUCH) lancets Use as instructed 300 each 4  . losartan (COZAAR) 100 MG tablet TAKE 1 TABLET EVERY DAY 90 tablet 3  . omeprazole (PRILOSEC) 40 MG capsule TAKE 1 CAPSULE EVERY DAY 90 capsule 3  . potassium chloride SA (K-DUR,KLOR-CON) 20 MEQ tablet TAKE 1 TABLET EVERY DAY 90 tablet 3  . PRESCRIPTION MEDICATION Proctozone 2.5% BID    . triamcinolone ointment (KENALOG) 0.1 % APPLY TWICE A DAY FOR 10 DAYS AS NEEDED FOR RASH 454 g 1  . ZYRTEC ALLERGY 10 MG tablet Take 10 mg by mouth daily.     . insulin glargine (LANTUS) 100 UNIT/ML injection Inject 25 Units into the skin every morning.     No current facility-administered medications on file prior to visit.    Allergies  Allergen Reactions  . Doxycycline Hives and Shortness Of Breath  . Mobic [Meloxicam]     "feeling of paralysis"  . Sulfa Antibiotics     Per positive allergy test   Social History   Social History  . Marital status: Divorced    Spouse name: N/A  .  Number of children: N/A  . Years of education: N/A   Occupational History  . Not on file.   Social History Main Topics  . Smoking status: Former Smoker    Quit date: 01/21/2012  . Smokeless tobacco: Never Used  . Alcohol use No  . Drug use: No  . Sexual activity: Not on file     Comment: married, son is battling leukemia.   Other Topics Concern  . Not on file   Social History Narrative  . No narrative on file   Family History  Problem Relation Age of Onset  . Hypertension Mother   . Cancer Mother     cervical  . Cancer Father     leukemia  .  Hyperlipidemia Sister   . Heart disease Brother   . Hyperlipidemia Brother   . Colon cancer Neg Hx   . Esophageal cancer Neg Hx   . Rectal cancer Neg Hx   . Stomach cancer Neg Hx       Review of Systems  Cardiovascular: Positive for palpitations.  Neurological: Positive for dizziness and light-headedness.  All other systems reviewed and are negative.      Objective:   Physical Exam  Constitutional: She is oriented to person, place, and time. She appears well-developed and well-nourished. No distress.  HENT:  Head: Normocephalic and atraumatic.  Right Ear: External ear normal.  Left Ear: External ear normal.  Nose: Nose normal.  Mouth/Throat: Oropharynx is clear and moist. No oropharyngeal exudate.  Eyes: Conjunctivae are normal. Pupils are equal, round, and reactive to light.  Neck: Normal range of motion. Neck supple. No JVD present. No tracheal deviation present. No thyromegaly present.  Cardiovascular: Normal rate and normal heart sounds.  An irregularly irregular rhythm present.  Pulmonary/Chest: Effort normal and breath sounds normal. No stridor. No respiratory distress. She has no wheezes. She has no rales.  Abdominal: Soft. Bowel sounds are normal. She exhibits no distension. There is no tenderness. There is no rebound and no guarding.  Musculoskeletal: Normal range of motion. She exhibits no edema, tenderness or  deformity.  Lymphadenopathy:    She has no cervical adenopathy.  Neurological: She is alert and oriented to person, place, and time. She has normal reflexes. She displays normal reflexes. No cranial nerve deficit. She exhibits normal muscle tone. Coordination normal.  Skin: She is not diaphoretic.  Psychiatric: She has a normal mood and affect. Her behavior is normal. Judgment and thought content normal.  Vitals reviewed.         Assessment & Plan:  Gen Med exam Diabetes Mellitus II Palpitations HTN Presyncope  EKG today shows normal sinus rhythm. There are definite P waves. She has normal intervals and normal axis. EKG is completely opposite of what I appreciated on her exam. Either she's having frequent PACs and PVCs which I just failed to capture or she has paroxysmal atrial fibrillation.  In any regard, I will have the patient see a cardiologist for an event monitor particular given her presyncope.  Her blood pressure today is well controlled. Her sugars sound well controlled. I will check a hemoglobin A1c, a urine microalbumin, fasting lipid panel, CMP, and a CBC. I will also check a TSH particularly given her palpitations along with a free T3 and a free T4. I encouraged the patient to schedule her mammogram as well as her colonoscopy but I feel we need to workup her presyncope further.  When I went back in to examine the patient after her EKG, she is in tachycardia with a heart rate of 110 bpm. 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  It has been subclinical for the last few years. A radioactive iodine uptake scan performed in 2015 revealed:  Multinodular thyroid gland.  Normal 24 hr radio iodine uptake of 15%.  I suspect that she may now be clinically hyperthyroid we will see what we check a TSH. I believe this could be causing some type of multifocal atrial tachycardia versus paroxysmal atrial fibrillation versus frequent PVCs and PACs. Will need an event monitor to  determine the cardiac arrhythmia. Meanwhile the patient to start Toprol-XL 25 mg a day

## 2017-02-15 LAB — LIPID PANEL
Cholesterol: 175 mg/dL (ref <200)
HDL: 39 mg/dL — AB (ref >50)
LDL Cholesterol: 104 mg/dL — ABNORMAL HIGH (ref <100)
Total CHOL/HDL Ratio: 4.5 Ratio (ref <5.0)
Triglycerides: 161 mg/dL — ABNORMAL HIGH (ref <150)
VLDL: 32 mg/dL — ABNORMAL HIGH (ref <30)

## 2017-02-15 LAB — COMPLETE METABOLIC PANEL WITH GFR
ALBUMIN: 4 g/dL (ref 3.6–5.1)
ALK PHOS: 88 U/L (ref 33–130)
ALT: 17 U/L (ref 6–29)
AST: 15 U/L (ref 10–35)
BILIRUBIN TOTAL: 0.6 mg/dL (ref 0.2–1.2)
BUN: 9 mg/dL (ref 7–25)
CO2: 32 mmol/L — ABNORMAL HIGH (ref 20–31)
Calcium: 8.8 mg/dL (ref 8.6–10.4)
Chloride: 98 mmol/L (ref 98–110)
Creat: 0.55 mg/dL (ref 0.50–1.05)
Glucose, Bld: 105 mg/dL — ABNORMAL HIGH (ref 70–99)
Potassium: 2.7 mmol/L — CL (ref 3.5–5.3)
SODIUM: 145 mmol/L (ref 135–146)
TOTAL PROTEIN: 7.2 g/dL (ref 6.1–8.1)

## 2017-02-15 LAB — HEMOGLOBIN A1C
HEMOGLOBIN A1C: 5.9 % — AB (ref <5.7)
Mean Plasma Glucose: 123 mg/dL

## 2017-02-15 LAB — MICROALBUMIN, URINE: MICROALB UR: 0.9 mg/dL

## 2017-02-16 ENCOUNTER — Other Ambulatory Visit: Payer: Self-pay | Admitting: Family Medicine

## 2017-02-16 DIAGNOSIS — E876 Hypokalemia: Secondary | ICD-10-CM

## 2017-02-16 LAB — T4, FREE: FREE T4: 1.2 ng/dL (ref 0.8–1.8)

## 2017-02-16 LAB — T3, FREE: T3 FREE: 3.6 pg/mL (ref 2.3–4.2)

## 2017-02-17 ENCOUNTER — Ambulatory Visit (INDEPENDENT_AMBULATORY_CARE_PROVIDER_SITE_OTHER): Payer: Medicare HMO | Admitting: Internal Medicine

## 2017-02-17 ENCOUNTER — Ambulatory Visit (INDEPENDENT_AMBULATORY_CARE_PROVIDER_SITE_OTHER): Payer: Medicare HMO

## 2017-02-17 ENCOUNTER — Other Ambulatory Visit: Payer: Self-pay | Admitting: Family Medicine

## 2017-02-17 ENCOUNTER — Encounter: Payer: Self-pay | Admitting: Family Medicine

## 2017-02-17 VITALS — BP 140/100 | HR 77 | Ht 66.0 in | Wt 259.0 lb

## 2017-02-17 DIAGNOSIS — R002 Palpitations: Secondary | ICD-10-CM | POA: Diagnosis not present

## 2017-02-17 DIAGNOSIS — R0789 Other chest pain: Secondary | ICD-10-CM | POA: Diagnosis not present

## 2017-02-17 DIAGNOSIS — R55 Syncope and collapse: Secondary | ICD-10-CM

## 2017-02-17 DIAGNOSIS — I499 Cardiac arrhythmia, unspecified: Secondary | ICD-10-CM | POA: Diagnosis not present

## 2017-02-17 DIAGNOSIS — I10 Essential (primary) hypertension: Secondary | ICD-10-CM

## 2017-02-17 MED ORDER — METOPROLOL SUCCINATE ER 25 MG PO TB24
25.0000 mg | ORAL_TABLET | Freq: Two times a day (BID) | ORAL | 3 refills | Status: DC
Start: 2017-02-17 — End: 2017-03-16

## 2017-02-17 NOTE — Progress Notes (Signed)
Labs continue to show subclinical hyperthyroidism.  However, I believe she is now symptomatic due to palpitations and irregular heart beat.This is due to her multinodular goiter seen on her uptake scan in 2015.  I believe her palpitations would benefit from treatment with the toprol I prescribed and possibly methimazole 15 mg poqday to suppress the thyroid levels.  I would like her to see and endocrinologist to discuss further.

## 2017-02-17 NOTE — Progress Notes (Signed)
New Outpatient Visit Date: 02/17/2017  Referring Provider: [name], MD [address] [place], [place] [address]  Chief Complaint: Palpitations and lightheadedness  HPI:  Yolanda Gallegos is seen today for the evaluation of palpitations and lightheadedness at the request of Dr. Dennard Schaumann. She a 56 y.o. year-old female with history of diabetes mellitus, hypertension, hyperlipidemia, subclinical hyperthyroidism, and GERD. She notes that for several years, she has had episodes where it feels as though her heart stops for a second or two. This is often accompanied by a warm feeling and lightheadedness. She feels as though she may black out but has never passed out or fallen. These episodes were generally very infrequent, happening about once a year. However, over the last few months, the frequency has increased significantly. She notes 4-5 episodes in the last month. She denies associated chest pain, nausea, and shortness of breath. She has experienced a "pinch" in her left upper chest that happens randomly and resolves with belching. She has not had any exertional chest pain. She also denies leg edema, orthopnea, PND, and claudication.  Yolanda Gallegos reports undergoing a stress test several years ago, which she believes was normal. She was recently started on metoprolol by Dr. Dennard Schaumann with improvement in the intensity of her palpitations. She has noted a little bit of lightheadedness with this medication but otherwise is tolerating it well. She drinks about 20 ounces of caffeinated coffee every morning. The patient was also noted to have significant hypokalemia (potassium 2.7) earlier this week. She is on potassium supplementation per Dr. Dennard Schaumann.  --------------------------------------------------------------------------------------------------  Cardiovascular History & Procedures: Cardiovascular Problems:  Palpitations  Atypical chest pain  Risk Factors:  Diabetes mellitus, hypertension,  hyperlipidemia, obesity, and history of tobacco use  Cath/PCI:  None  CV Surgery:  None  EP Procedures and Devices:  None  Non-Invasive Evaluation(s):  None available; patient reports negative stress test several years ago  Recent CV Pertinent Labs: Lab Results  Component Value Date   CHOL 175 02/14/2017   HDL 39 (L) 02/14/2017   LDLCALC 104 (H) 02/14/2017   TRIG 161 (H) 02/14/2017   CHOLHDL 4.5 02/14/2017   K 2.7 (LL) 02/14/2017   BUN 9 02/14/2017   CREATININE 0.55 02/14/2017    --------------------------------------------------------------------------------------------------  Past Medical History:  Diagnosis Date  . Allergy   . Colon polyps 2015  . COPD (chronic obstructive pulmonary disease) ([place])   . Diabetes mellitus without complication ([place])   . GERD (gastroesophageal reflux disease)   . Hyperlipidemia   . Hypertension   . Legally blind in left eye, as defined in [place]   . Meningioma ([place])   . Multinodular goiter    subclinical hyperthyroidism  . Smoker   . Subclinical hyperthyroidism    due to multinodular goiter.  Confirmed on uptak escan 2015  . Tubular adenoma 2015    Past Surgical History:  Procedure Laterality Date  . ABDOMINAL HYSTERECTOMY  1999   left oopherectomy  . 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 BREAST BIOPSY Left 2007   benign  . [place]  . INNER EAR SURGERY Right 2001    Outpatient Encounter Prescriptions as of 02/17/2017  Medication Sig  . Alcohol Swabs 70 % PADS Use as directed prn fingerstick / insulin injectioin - DX:E11.9  . atorvastatin (LIPITOR) 40 MG tablet TAKE 1 TABLET EVERY EVENING AT BEDTIME  . Blood Glucose Monitoring Suppl (ACCU-CHEK AVIVA PLUS)  w/Device KIT Checks BS bid - tid  . Dulaglutide (TRULICITY) 7.34 KA/7.6OT SOPN Inject SQ weekly  . fluticasone (FLONASE) 50 MCG/ACT nasal spray USE 2 SPRAYS IN EACH NOSTRIL EVERY DAY  . glucose blood (ACCU-CHEK  AVIVA PLUS) test strip Checks BS bid - tid  DX: E11.9  . hydrochlorothiazide (HYDRODIURIL) 25 MG tablet TAKE 1 TABLET EVERY DAY  . Lancets (ACCU-CHEK SOFT TOUCH) lancets Use as instructed  . losartan (COZAAR) 100 MG tablet TAKE 1 TABLET EVERY DAY  . metoprolol succinate (TOPROL-XL) 25 MG 24 hr tablet Take 1 tablet (25 mg total) by mouth daily.  [place] omeprazole (PRILOSEC) 40 MG capsule TAKE 1 CAPSULE EVERY DAY  . potassium chloride SA (K-DUR,KLOR-CON) 20 MEQ tablet TAKE 1 TABLET EVERY DAY  . PRESCRIPTION MEDICATION Proctozone 2.5% BID  . triamcinolone ointment (KENALOG) 0.1 % APPLY TWICE A DAY FOR 10 DAYS AS NEEDED FOR RASH  . ZYRTEC ALLERGY 10 MG tablet Take 10 mg by mouth daily.   . [DISCONTINUED] hydrocortisone (ANUSOL-HC) 2.5 % rectal cream Place 1 application rectally 2 (two) times daily.  . [DISCONTINUED] insulin glargine (LANTUS) 100 UNIT/ML injection Inject 25 Units into the skin every morning.  . [DISCONTINUED] Insulin Pen Needle 31G X 5 MM MISC Injects qam - DX: E11.9 (Patient not taking: Reported on 02/17/2017)   No facility-administered encounter medications on file as of 02/17/2017.     Allergies: Doxycycline; Mobic [meloxicam]; and Sulfa antibiotics  Social History   Social History  . Marital status: Divorced    Spouse name: N/A  . Number of children: N/A  . Years of education: N/A   Occupational History  . Not on file.   Social History Main Topics  . Smoking status: Former Smoker    Quit date: 01/21/2012  . Smokeless tobacco: Never Used  . Alcohol use No  . Drug use: No  . Sexual activity: Not on file     Comment: married, son is battling leukemia.   Other Topics Concern  . Not on file   Social History Narrative  . No narrative on file    Family History  Problem Relation Age of Onset  . Hypertension Mother   . Cancer Mother     cervical  . Cancer Father     leukemia  . Hyperlipidemia Sister   . Heart disease Brother   . Hyperlipidemia Brother   . Colon  cancer Neg Hx   . Esophageal cancer Neg Hx   . Rectal cancer Neg Hx   . Stomach cancer Neg Hx     Review of Systems: A 12-system review of systems was performed and was negative except as noted in the HPI.  --------------------------------------------------------------------------------------------------  Physical Exam: BP (!) 140/100 (BP Location: Right Arm, Patient Position: Sitting, Cuff Size: Normal)   Pulse 77   Ht _0  (1.676 m)   Wt 259 lb (117.5 kg)   BMI 41.80 kg/m   General:  Morbidly obese woman, seated comfortably in the exam room. HEENT: No conjunctival pallor or scleral icterus.  Moist mucous membranes.  OP clear. Neck: Supple without lymphadenopathy, thyromegaly, JVD, or HJR, though evaluation is limited by body habitus..  No carotid bruit. Lungs: Normal work of breathing.  Clear to auscultation bilaterally without wheezes or crackles. Heart: Regular rate and rhythm without murmurs, rubs, or gallops. Unable to assess PMI due to body habitus. Abd: Bowel sounds present.  Soft, NT/ND. Unable to assess hepatosplenomegaly due to body habitus. Ext: No lower extremity edema.  Radial,  PT, and DP pulses are 2+ bilaterally Skin: warm and dry without rash Neuro: CNIII-XII intact.  Strength and fine-touch sensation intact in upper and lower extremities bilaterally. Psych: Normal mood and affect.  EKG:  Normal sinus rhythm with mild QT prolongation (QTc 493 ms). Heart rate has decreased since previous tracing on 02/14/17 (I have personally reviewed both tracings).  Lab Results  Component Value Date   WBC 10.8 02/14/2017   HGB 13.6 02/14/2017   HCT 40.7 02/14/2017   MCV 84.8 02/14/2017   PLT 320 02/14/2017    Lab Results  Component Value Date   NA 145 02/14/2017   K 2.7 (LL) 02/14/2017   CL 98 02/14/2017   CO2 32 (H) 02/14/2017   BUN 9 02/14/2017   CREATININE 0.55 02/14/2017   GLUCOSE 105 (H) 02/14/2017   ALT 17 02/14/2017    Lab Results  Component Value Date    CHOL 175 02/14/2017   HDL 39 (L) 02/14/2017   LDLCALC 104 (H) 02/14/2017   TRIG 161 (H) 02/14/2017   CHOLHDL 4.5 02/14/2017   Lab Results  Component Value Date   TSH 0.03 (L) 02/14/2017   T3TOTAL 147.0 12/29/2015   T4TOTAL 8.3 12/29/2015    --------------------------------------------------------------------------------------------------  ASSESSMENT AND PLAN: Palpitations and near syncope Patient has a long history of sporadic palpitations, though these have become much more frequent over the last few months. Exam today is unrevealing. EKG demonstrates sinus rhythm with a heart rate of 77 bpm. Mild QT prolongation is noted. We have agreed to obtain a transthoracic echocardiogram to evaluate for structural abnormalities as well as a 14 day event monitor to better characterize potential arrhythmias. It is possible that her electrolyte disturbances, including significant hypokalemia, may be contributing to her symptoms and QT prolongation. Subclinical hypothyroidism may also be adding to this. TSH was 0.03 earlier this week with a normal free T4. I agree with potassium supplementation. Potassium should be rechecked in about a week to ensure normalization. Magnesium level should also be checked at that time. Given her electrolyte abnormalities, hydrochlorothiazide will be stopped. We will increase metoprolol succinate to 25 mg twice a day for improved blood pressure control and hopefully better control of her palpitations. Yolanda Gallegos was advised to cut back, and ideally stop, her caffeine consumption.  Atypical chest pain Pain is very intermittent and nonexertional. It seems improved with belching. Suspect this is due to GERD or esophageal spasm. No further workup at this time.  Hypertension BP elevated today. As above, will discontinue HCTZ, given hypokalemia, and increase metoprolol succinate to 25 mg twice a day.  Follow-up: Return to clinic in 1 month  Nelva Bush, MD 02/19/2017 2:39  PM

## 2017-02-17 NOTE — Patient Instructions (Signed)
Medication Instructions:  Your physician has recommended you make the following change in your medication:  1- CHANGE Metoprolol succinate to 25 mg by mouth two times a  day.  2- STOP Hydrochlorothiazide.    Labwork: none  Testing/Procedures: Your physician has requested that you have an echocardiogram. Echocardiography is a painless test that uses sound waves to create images of your heart. It provides your doctor with information about the size and shape of your heart and how well your heart's chambers and valves are working. This procedure takes approximately one hour. There are no restrictions for this procedure.  Your physician has recommended that you wear an 14- day ZIO event monitor. Event monitors are medical devices that record the heart's electrical activity. Doctors most often [place] these monitors to diagnose arrhythmias. Arrhythmias are problems with the speed or rhythm of the heartbeat. The monitor is a small, portable device. You can wear one while you do your normal daily activities. This is usually used to diagnose what is causing palpitations/syncope (passing out). - REMOVE ON 03/03/17.   Follow-Up: Your physician recommends that you schedule a follow-up appointment in: [place].  If you need a refill on your cardiac medications before your next appointment, please call your pharmacy.

## 2017-02-19 ENCOUNTER — Encounter: Payer: Self-pay | Admitting: Internal Medicine

## 2017-02-25 ENCOUNTER — Other Ambulatory Visit: Payer: Medicare HMO

## 2017-02-25 DIAGNOSIS — E876 Hypokalemia: Secondary | ICD-10-CM | POA: Diagnosis not present

## 2017-02-25 LAB — BASIC METABOLIC PANEL
BUN: 9 mg/dL (ref 7–25)
CO2: 30 mmol/L (ref 20–31)
CREATININE: 0.53 mg/dL (ref 0.50–1.05)
Calcium: 8.6 mg/dL (ref 8.6–10.4)
Chloride: 105 mmol/L (ref 98–110)
Glucose, Bld: 103 mg/dL — ABNORMAL HIGH (ref 70–99)
POTASSIUM: 3.5 mmol/L (ref 3.5–5.3)
Sodium: 144 mmol/L (ref 135–146)

## 2017-03-09 ENCOUNTER — Telehealth: Payer: Self-pay | Admitting: Family Medicine

## 2017-03-09 DIAGNOSIS — E0590 Thyrotoxicosis, unspecified without thyrotoxic crisis or storm: Secondary | ICD-10-CM

## 2017-03-09 DIAGNOSIS — E042 Nontoxic multinodular goiter: Secondary | ICD-10-CM

## 2017-03-09 NOTE — Telephone Encounter (Signed)
-----   Message from [name], MD sent at 02/17/2017  7:46 AM EDT ----- Labs continue to show that the patient has subclinical hyperthyroidism. However I now believe she is symptomatic from this given her palpitations and racing heartbeat. This is due to multinodular goiter that was seen on her uptake scan performed in 2015. I believe Chanelle benefit from treatment including the Toprol that I prescribed and possibly methimazole 15 mg a day to suppress the thyroid nodules. I would like her to see an endocrinologist to discuss this further to see if they have any other options

## 2017-03-09 NOTE — Telephone Encounter (Signed)
Finally spoke to pt after several attempts - pt is ok with seeing and endocrinologist - referral placed. She states that the cardiologist increased her metoprolol to 25mg  bid and she has not been taking her Trulicity weekly only when her blood sugars are over 130 then she will take it. Informed her that she needed to be taking it weekly and if she needed samples we will be happy to provide them for her when she hit the doughnut hole.

## 2017-03-10 ENCOUNTER — Other Ambulatory Visit: Payer: Self-pay | Admitting: Internal Medicine

## 2017-03-10 DIAGNOSIS — R42 Dizziness and giddiness: Secondary | ICD-10-CM

## 2017-03-10 DIAGNOSIS — R0789 Other chest pain: Secondary | ICD-10-CM

## 2017-03-10 DIAGNOSIS — R55 Syncope and collapse: Secondary | ICD-10-CM

## 2017-03-11 DIAGNOSIS — R002 Palpitations: Secondary | ICD-10-CM | POA: Diagnosis not present

## 2017-03-16 ENCOUNTER — Other Ambulatory Visit: Payer: Self-pay | Admitting: *Deleted

## 2017-03-16 ENCOUNTER — Telehealth: Payer: Self-pay | Admitting: Internal Medicine

## 2017-03-16 DIAGNOSIS — R079 Chest pain, unspecified: Secondary | ICD-10-CM

## 2017-03-16 DIAGNOSIS — Z01818 Encounter for other preprocedural examination: Secondary | ICD-10-CM

## 2017-03-16 DIAGNOSIS — I10 Essential (primary) hypertension: Secondary | ICD-10-CM

## 2017-03-16 DIAGNOSIS — R002 Palpitations: Secondary | ICD-10-CM

## 2017-03-16 DIAGNOSIS — Z79899 Other long term (current) drug therapy: Secondary | ICD-10-CM

## 2017-03-16 MED ORDER — METOPROLOL SUCCINATE ER 25 MG PO TB24: 25.0000 mg | ORAL_TABLET | Freq: Two times a day (BID) | ORAL | 3 refills | Status: DC

## 2017-03-16 MED ORDER — ASPIRIN EC 81 MG PO TBEC: 81.0000 mg | DELAYED_RELEASE_TABLET | Freq: Every day | ORAL | 3 refills | Status: DC

## 2017-03-16 NOTE — Telephone Encounter (Signed)
I spoke with Yolanda Gallegos regarding the results of her 14-day event monitor, which revealed PACs and PVCs as well as paroxysmal SVT and nonsustained ventricular tachycardia lasting up to 15 beats. She noted some lightheadedness with these episodes. She has not had any further chest pain since our visit. We discussed further evaluation options, including ischemia evaluation. Given her body habitus, I am concerned that a myocardial perfusion stress test will be limited by potential artifacts. Additionally, she has multiple cardiac risk factors with NSVT, raising the potential for significant CAD. We have therefore agreed to proceed with cardiac catheterization. We will have the patient come in for BMP, CBC, and INR in anticipation of the catheterization. I have also asked that she start taking aspirin 81 mg daily. She was advised to seek immediate medical attention if her symptoms worsen in the meantime.  Yolanda Bush, MD [facility] Pager: [phone]

## 2017-03-16 NOTE — Telephone Encounter (Signed)
Left heart catheterization schedule for 03/25/17 at 0730, arrive at 0530.  Will notify patient.

## 2017-03-16 NOTE — Telephone Encounter (Signed)
Patient also need chest xray prior to heart cath per protocol. Order entered and message left for patient to get chest xray at the same time as the lab work and to call back if she has any questions.

## 2017-03-16 NOTE — Telephone Encounter (Signed)
Spoke with patient. She wrote down and verbalized understanding to have echo on 03/22/17 at 4pm and to also get preoperative lab work that day as well.  Reviewed the following Left heart cath instructions and she verbalized understanding:  You are scheduled for a LEFT Cardiac Catheterization on 03/25/17 with Dr. Saunders Revel.  Please arrive at the [place] [facility] of [facility] ([place]) at 0530am on the day of your procedure.  1. Nothing to eat or drink after midnight. 2. You should take your medication as usual with a sip of water; this includes your aspirin and Plavix / Effient / Brilinta. 3. If you are diabetic, do NOT take your diabetic medication(s) or insulin the morning of the procedure.  These will be given to you after the procedure. 4. If you are taking any medication with Glucophage (Metformin) in it, do NOT take your dose the day before or the day of your procedure.  You will be instructed when to re-start your medication. 5. DO NOT take your Losartan the morning of the procedure. 6. You will need bloodwork and a chest xray prior to the procedure.  You do not have to be fasting.  7. Plan for one night stay - bring personal belongings (i.e. Toothpaste, toothbrush, etc.). 8. Bring a current list of your medications and current insurance cards. 9. Must have a responsible person to drive you home. 10. Someone must be with you for the first 24 hours after you arrive home. 11. Please wear clothes that are easy to get on and off and wear slip-on shoes.  * Special note:  Every effort is made to have your procedure done on time.  Occasionally there are emergencies that present themselves at the hospital that may cause delays.  Please be patient if a delay does occur.  If you have any questions after you get home, please call the office at [phone].

## 2017-03-16 NOTE — Addendum Note (Signed)
Addended by: Vanessa Ralphs on: 03/16/2017 02:39 PM   Modules accepted: Orders

## 2017-03-17 ENCOUNTER — Telehealth: Payer: Self-pay | Admitting: Internal Medicine

## 2017-03-17 NOTE — Telephone Encounter (Signed)
Pt calling stating she would like to know what she can and can't take the morning of her procedure on 03/25/17  Please call back

## 2017-03-17 NOTE — Telephone Encounter (Signed)
Returned call to patient. Clarified that she is to hold her Losartan the day of procedure but she can take her Metoprolol the morning of a procedure along with other morning medications with a small sip of water. She also is aware to go to [facility] for chest xray and labs after her echo in the office on 03/22/17.

## 2017-03-18 ENCOUNTER — Telehealth: Payer: Self-pay | Admitting: Internal Medicine

## 2017-03-18 NOTE — Telephone Encounter (Signed)
Ms. Yolanda Gallegos should continue holding aspirin. Can we have her see someone in the office early next week for further evaluation? Given upcoming cath next week, it is important to determine if she truly has an aspirin allergy. Thanks.

## 2017-03-18 NOTE — Telephone Encounter (Signed)
Please see if she can come in at 2:20 PM on 03/22/17. Thanks.

## 2017-03-18 NOTE — Telephone Encounter (Signed)
Patient states that she started taking baby aspirin on Wednesday night and she woke up with facial swelling. She did not take any today and wanted to check with [place]. Instructed her to stop the aspirin and see if the swelling will subside. She states that it is going down a little so instructed her to monitor and if it does not improve or if it should worsen then she should go to the emergency room for evaluation. She verbalized understanding of instructions, was agreeable to plan, and had no further questions. Let her know that I would make Dr. Saunders Revel aware.

## 2017-03-18 NOTE — Telephone Encounter (Signed)
Spoke with patient and let her know that Dr. Saunders Revel recommends that she come in early next week and first available is Wednesday Mar 23, 2017 at 10:20 AM Patient is not able to come on that day and wants to know if she can somehow come on Tuesday since she is coming at 4 pm for her echocardiogram. Let her know that I would send this to scheduling to have them call her first thing Monday morning to see if they can get her in on that day. She was appreciative for the call and had no further questions at this time.

## 2017-03-18 NOTE — Telephone Encounter (Signed)
Pt calling stating she was told to start taking Asprin every day And she started since Wednesday night, she's had a swollen face.  She is not sure if she needs to keep taking it.  She took on pill Wednesday night and Thursday morning and has not taken any today It has gone down a little  Please advise.

## 2017-03-21 NOTE — Telephone Encounter (Signed)
Spoke with patient. She is agreeable to come in at 2:20pm on 03/22/17 for appt with Dr. Saunders Revel. Appt scheduled.

## 2017-03-22 ENCOUNTER — Encounter: Payer: Self-pay | Admitting: Internal Medicine

## 2017-03-22 ENCOUNTER — Ambulatory Visit (INDEPENDENT_AMBULATORY_CARE_PROVIDER_SITE_OTHER): Payer: Medicare HMO | Admitting: Internal Medicine

## 2017-03-22 ENCOUNTER — Ambulatory Visit (INDEPENDENT_AMBULATORY_CARE_PROVIDER_SITE_OTHER): Payer: Medicare HMO

## 2017-03-22 ENCOUNTER — Other Ambulatory Visit: Payer: Self-pay

## 2017-03-22 ENCOUNTER — Ambulatory Visit
Admission: RE | Admit: 2017-03-22 | Discharge: 2017-03-22 | Disposition: A | Discharge status: Home / Self Care | Payer: Medicare HMO | Source: Ambulatory Visit | Attending: Internal Medicine | Admitting: Internal Medicine

## 2017-03-22 VITALS — BP 120/78 | HR 93 | Ht 66.0 in | Wt 261.5 lb

## 2017-03-22 DIAGNOSIS — R002 Palpitations: Secondary | ICD-10-CM

## 2017-03-22 DIAGNOSIS — R0789 Other chest pain: Secondary | ICD-10-CM

## 2017-03-22 DIAGNOSIS — R42 Dizziness and giddiness: Secondary | ICD-10-CM | POA: Diagnosis not present

## 2017-03-22 DIAGNOSIS — Z01818 Encounter for other preprocedural examination: Secondary | ICD-10-CM

## 2017-03-22 DIAGNOSIS — Z01810 Encounter for preprocedural cardiovascular examination: Secondary | ICD-10-CM | POA: Diagnosis not present

## 2017-03-22 DIAGNOSIS — R55 Syncope and collapse: Secondary | ICD-10-CM

## 2017-03-22 DIAGNOSIS — I4729 Other ventricular tachycardia: Secondary | ICD-10-CM

## 2017-03-22 DIAGNOSIS — R079 Chest pain, unspecified: Secondary | ICD-10-CM

## 2017-03-22 DIAGNOSIS — R600 Localized edema: Secondary | ICD-10-CM | POA: Diagnosis not present

## 2017-03-22 DIAGNOSIS — I10 Essential (primary) hypertension: Secondary | ICD-10-CM

## 2017-03-22 DIAGNOSIS — I472 Ventricular tachycardia: Secondary | ICD-10-CM | POA: Diagnosis not present

## 2017-03-22 DIAGNOSIS — I471 Supraventricular tachycardia, unspecified: Secondary | ICD-10-CM

## 2017-03-22 DIAGNOSIS — Z0389 Encounter for observation for other suspected diseases and conditions ruled out: Secondary | ICD-10-CM | POA: Diagnosis not present

## 2017-03-22 NOTE — Progress Notes (Signed)
Aspirin 81 mg enteric-coated given by mouth to patient at 1511. Will monitor patient for any reaction. Prior to giving aspirin, patient's left eye was slightly swollen and red around the eye. Patient stated this is how her eye normally looks as result of past craniotomy.

## 2017-03-22 NOTE — Patient Instructions (Addendum)
Medication Instructions:  Your physician recommends that you continue on your current medications as directed. Please refer to the Current Medication list given to you today.   Labwork: Your physician recommends that you return for lab work in: [place].  Testing/Procedures: You are scheduled for a LEFT Cardiac Catheterization on 03/25/17 with Dr. Saunders Revel.  Please arrive at the [place] [facility] of [facility] ([place]) at 0530am on the day of your procedure.  1. Nothing to eat or drink after midnight. 2. You should take your medication as usual with a sip of water; this includes your aspirin and Plavix / Effient / Brilinta. 3. If you are diabetic, do NOT take your diabetic medication(s) or insulin the morning of the procedure.  These will be given to you after the procedure. 4. If you are taking any medication with Glucophage (Metformin) in it, do NOT take your dose the day before or the day of your procedure.  You will be instructed when to re-start your medication. 5. DO NOT take your Losartan the morning of the procedure. 6. You will need bloodwork and a chest xray prior to the procedure.  You do not have to be fasting.  7. Plan for one night stay - bring personal belongings (i.e. Toothpaste, toothbrush, etc.). 8. Bring a current list of your medications and current insurance cards. 9. Must have a responsible person to drive you home. 10. Someone must be with you for the first 24 hours after you arrive home. 11. Please wear clothes that are easy to get on and off and wear slip-on shoes.  * Special note:  Every effort is made to have your procedure done on time.  Occasionally there are emergencies that present themselves at the hospital that may cause delays.  Please be patient if a delay does occur.  If you have any questions after you get home, please call the office at [phone].      Follow-Up: Your physician recommends that you schedule a  follow-up appointment in: [place].   If you need a refill on your cardiac medications before your next appointment, please call your pharmacy.   Angiogram An angiogram, also called angiography, is a procedure used to look at the blood vessels. In this procedure, dye is injected through a long, thin tube (catheter) into an artery. X-rays are then taken. The X-rays will show if there is a blockage or problem in a blood vessel. Tell a health care provider about:  Any allergies you have, including allergies to shellfish or contrast dye.  All medicines you are taking, including vitamins, herbs, eye drops, creams, and over-the-counter medicines.  Any problems you or family members have had with anesthetic medicines.  Any blood disorders you have.  Any surgeries you have had.  Any previous kidney problems or failure you have had.  Any medical conditions you have.  Possibility of pregnancy, if this applies. What are the risks? Generally, an angiogram is a safe procedure. However, as with any procedure, problems can occur. Possible problems include:  Injury to the blood vessels, including rupture or bleeding.  Infection or bruising at the catheter site.  Allergic reaction to the dye or contrast used.  Kidney damage from the dye or contrast used.  Blood clots that can lead to a stroke or heart attack. What happens before the procedure?  Do not eat or drink after midnight on the night before the procedure, or as directed by your health care  provider.  Ask your health care provider if you may drink enough water to take any needed medicines the morning of the procedure. What happens during the procedure?  You may be given a medicine to help you relax (sedative) before and during the procedure. This medicine is given through an IV access tube that is inserted into one of your veins.  The area where the catheter will be inserted will be washed and shaved. This is  usually done in the groin but may be done in the fold of your arm (near your elbow) or in the wrist.  A medicine will be given to numb the area where the catheter will be inserted (local anesthetic).  The catheter will be inserted with a guide wire into an artery. The catheter is guided by using a type of X-ray (fluoroscopy) to the blood vessel being examined.  Dye is then injected into the catheter, and X-rays are taken. The dye helps to show where any narrowing or blockages are located. What happens after the procedure?  If the procedure is done through the leg, you will be kept in bed lying flat for several hours. You will be instructed to not bend or cross your legs.  The insertion site will be checked frequently.  The pulse in your feet or wrist will be checked frequently.  Additional blood tests, X-rays, and electrocardiography may be done.  You may need to stay in the hospital overnight for observation. This information is not intended to replace advice given to you by your health care provider. Make sure you discuss any questions you have with your health care provider. Document Released: 08/18/2005 Document Revised: 04/21/2016 Document Reviewed: 04/11/2013 Elsevier Interactive Patient Education  2017 [employer].

## 2017-03-22 NOTE — Progress Notes (Signed)
Follow-up Outpatient Visit Date: 03/22/2017  Primary Care Provider: Odette Fraction, MD [address] [place] [address]  Chief Complaint: Follow-up abnormal event monitor  HPI:  Ms. Coste is a 56 y.o. year-old female with history of diabetes mellitus, hypertension, hyperlipidemia, subclinical hyperthyroidism, and GERD, who presents for follow-up of abnormal event monitor. I last saw her on 02/17/17, at which time she reported several years worth of palpitations with accompanying lightheadedness. She also endorsed atypical chest pain at our visit. We obtained a 14-day event monitor, which was notable for rare PSVT and non-sustained VT (lasting up to 15 beats). Given these findings, as well as atypical chest pain and multiple cardiac risk factors, we agreed to schedule outpatient cardiac catheterization. In anticipation of this, I asked Ms. Rathje to begin taking aspirin 81 mg daily. However, the morning after taking her first dose, she awoke with facial swelling. It should be noted that Ms. Moseley has used aspirin in the past without any difficulty. She describes puffiness about both eyes, more pronounced on the left. This has improved but not completely resolved. She does not know of any other medication changes or environmental factors that could have precipitated this. She did not have any shortness of breath, wheezing, or oral pharyngeal edema but may have had a little bit of itching.  The patient has felt relatively well with the exception of occasional brief palpitations and accompanying lightheadedness. She is not had any further chest pain, shortness of breath, or edema.  --------------------------------------------------------------------------------------------------  Cardiovascular Problems:  Palpitations  Atypical chest pain  Risk Factors:  Diabetes mellitus, hypertension, hyperlipidemia, obesity, and history of tobacco use  Cath/PCI:  None  CV  Surgery:  None  EP Procedures and Devices:  14-day event monitor (02/17/17): Predominantly sinus rhythm with rare PSVT and NSVT, as well as rare PACs and PVCs.  Non-Invasive Evaluation(s):  None available; patient reports negative stress test several years ago  Recent CV Pertinent Labs: Lab Results  Component Value Date   CHOL 175 02/14/2017   HDL 39 (L) 02/14/2017   LDLCALC 104 (H) 02/14/2017   TRIG 161 (H) 02/14/2017   CHOLHDL 4.5 02/14/2017   K 3.5 02/25/2017   BUN 9 02/25/2017   CREATININE 0.53 02/25/2017    Past medical and surgical history were reviewed and updated in EPIC.  Outpatient Encounter Prescriptions as of 03/22/2017  Medication Sig  . acetaminophen (TYLENOL) 500 MG tablet Take 1,000 mg by mouth every 6 (six) hours as needed for moderate pain or headache.  . Alcohol Swabs 70 % PADS Use as directed prn fingerstick / insulin injectioin - DX:E11.9  . atorvastatin (LIPITOR) 40 MG tablet TAKE 1 TABLET EVERY EVENING AT BEDTIME  . Blood Glucose Monitoring Suppl (ACCU-CHEK AVIVA PLUS) w/Device KIT Checks BS bid - tid  . Dulaglutide (TRULICITY) 5.36 RW/4.3XV SOPN Inject SQ weekly  . fluticasone (FLONASE) 50 MCG/ACT nasal spray USE 2 SPRAYS IN EACH NOSTRIL EVERY DAY  . glucose blood (ACCU-CHEK AVIVA PLUS) test strip Checks BS bid - tid  DX: E11.9  . hydrocortisone (PROCTOZONE-HC) 2.5 % rectal cream Place 1 application rectally 2 (two) times daily as needed for hemorrhoids or itching.  . Lancets (ACCU-CHEK SOFT TOUCH) lancets Use as instructed  . losartan (COZAAR) 100 MG tablet TAKE 1 TABLET EVERY DAY (Patient taking differently: TAKE 1 TABLET EVERY DAY IN THE EVENING)  . metoprolol succinate (TOPROL-XL) 25 MG 24 hr tablet Take 1 tablet (25 mg total) by mouth 2 (two) times  daily.  . omeprazole (PRILOSEC) 40 MG capsule TAKE 1 CAPSULE EVERY DAY (Patient taking differently: TAKE 1 CAPSULE EVERY DAY IN THE EVENING)  . potassium chloride SA (K-DUR,KLOR-CON) 20 MEQ tablet TAKE 1  TABLET EVERY DAY  . terbinafine (LAMISIL) 1 % cream Apply 1 application topically daily as needed (athletes foot).  . triamcinolone ointment (KENALOG) 0.1 % APPLY TWICE A DAY FOR 10 DAYS AS NEEDED FOR RASH  . ZYRTEC ALLERGY 10 MG tablet Take 10 mg by mouth daily.    No facility-administered encounter medications on file as of 03/22/2017.     Allergies: Doxycycline; Aspirin; Glyxambi [empagliflozin-linagliptin]; Mobic [meloxicam]; and Sulfa antibiotics  Social History   Social History  . Marital status: Divorced    Spouse name: N/A  . Number of children: N/A  . Years of education: N/A   Occupational History  . Not on file.   Social History Main Topics  . Smoking status: Former Smoker    Packs/day: 1.50    Years: 30.00    Types: Cigarettes    Quit date: 2013  . Smokeless tobacco: Never Used  . Alcohol use No  . Drug use: No  . Sexual activity: Not on file     Comment: married   Other Topics Concern  . Not on file   Social History Narrative  . No narrative on file    Family History  Problem Relation Age of Onset  . Hypertension Mother   . Cancer Mother     cervical  . Cancer Father     leukemia  . Hyperlipidemia Sister   . Hyperlipidemia Brother   . Heart attack Brother 39  . Stroke Brother   . Leukemia Son   . Colon cancer Neg Hx   . Esophageal cancer Neg Hx   . Rectal cancer Neg Hx   . Stomach cancer Neg Hx     Review of Systems: Legs go numb when sits down or bends over. Otherwise, a 12-system review of systems was performed and was negative except as noted in the HPI.  --------------------------------------------------------------------------------------------------  Physical Exam: BP 120/78 (BP Location: Left Arm, Patient Position: Sitting, Cuff Size: Normal)   Pulse 93   Ht '5\' 6"'  (1.676 m)   Wt 261 lb 8 oz (118.6 kg)   BMI 42.21 kg/m   General:  Obese woman, seated comfortably in the exam room. HEENT: No conjunctival pallor or scleral icterus.   Mild puffiness about the left eye with minimal ptosis. Moist mucous membranes.  OP clear. Neck: Supple without lymphadenopathy, thyromegaly, JVD, or HJR. Lungs: Normal work of breathing.  Clear to auscultation bilaterally without wheezes or crackles. Heart: Regular rate and rhythm without murmurs, rubs, or gallops.  Unable to assess PMI due to body habitus. Abd: Bowel sounds present.  Soft, NT/ND. Unable to assess hepatosplenomegaly due to body habitus. Ext: No lower extremity edema.  Radial, PT, and DP pulses are 2+ bilaterally. Skin: warm and dry without rash  EKG:  Normal sinus rhythm with nonspecific ST changes and mild QT prolongation, not significantly changed from prior tracing (I have personally reviewed both tracings).  Lab Results  Component Value Date   WBC 10.8 02/14/2017   HGB 13.6 02/14/2017   HCT 40.7 02/14/2017   MCV 84.8 02/14/2017   PLT 320 02/14/2017    Lab Results  Component Value Date   NA 144 02/25/2017   K 3.5 02/25/2017   CL 105 02/25/2017   CO2 30 02/25/2017   BUN 9 02/25/2017  CREATININE 0.53 02/25/2017   GLUCOSE 103 (H) 02/25/2017   ALT 17 02/14/2017    Lab Results  Component Value Date   CHOL 175 02/14/2017   HDL 39 (L) 02/14/2017   LDLCALC 104 (H) 02/14/2017   TRIG 161 (H) 02/14/2017   CHOLHDL 4.5 02/14/2017    --------------------------------------------------------------------------------------------------  ASSESSMENT AND PLAN: Periorbital edema Mild puffiness noted about the left eye and questionable mild itching. Ms. Goren has taken aspirin in the past without any side effects. I am not entirely convinced that her symptoms were related to restarting aspirin. We have discussed referral to an allergist for further evaluation, trial of aspirin, or initiation of clopidogrel with plan to proceed with catheterization and PCI, if necessary, with single antiplatelet therapy. We have agreed to a trial of low-dose aspirin here in the office. The  patient was monitored for an hour without any worsening of her left periorbital edema or other symptoms. We will contact her again tomorrow to reassess her symptoms. She was advised to seek immediate medical attention if she develops shortness of breath, wheezing, or further facial swelling.  Palpitations with nonsustained VT and paroxysmal supraventricular tachycardia As previously noted, monitor showed nonsustained VT and PSVT. Given atypical chest pain in the past and multiple cardiac risk factors, we anticipate cardiac catheterization on Friday, as scheduled, unless the patient is truly intolerant of aspirin. We will continue current medications, including metoprolol. We will check a basic metabolic panel and magnesium level today. If cath does not show significant CAD, we will refer to EP for further evaluation, given nonsustained VT and mild QT prolongation noted on several EKGs.  Atypical chest pain No further episodes since her last visit, though Ms. Brandstetter has experienced occasional "warm feelings" in her chest. Given her multiple cardiac risk factors, including diabetes, hypertension, hyperlipidemia, and obesity, we will proceed with cardiac catheterization on Friday, unless issues with aspirin arise.  Hypertension Blood pressure is well controlled. No medication changes at this time.  Nelva Bush, MD 03/22/2017 5:09 PM

## 2017-03-23 ENCOUNTER — Telehealth: Payer: Self-pay | Admitting: *Deleted

## 2017-03-23 ENCOUNTER — Ambulatory Visit: Payer: Medicare HMO | Admitting: Internal Medicine

## 2017-03-23 ENCOUNTER — Telehealth: Payer: Self-pay | Admitting: Internal Medicine

## 2017-03-23 ENCOUNTER — Other Ambulatory Visit: Payer: Self-pay | Admitting: Internal Medicine

## 2017-03-23 DIAGNOSIS — I472 Ventricular tachycardia, unspecified: Secondary | ICD-10-CM

## 2017-03-23 DIAGNOSIS — E876 Hypokalemia: Secondary | ICD-10-CM

## 2017-03-23 DIAGNOSIS — R079 Chest pain, unspecified: Secondary | ICD-10-CM

## 2017-03-23 DIAGNOSIS — R002 Palpitations: Secondary | ICD-10-CM

## 2017-03-23 DIAGNOSIS — Z79899 Other long term (current) drug therapy: Secondary | ICD-10-CM

## 2017-03-23 LAB — CBC WITH DIFFERENTIAL/PLATELET
BASOS ABS: 0 10*3/uL (ref 0.0–0.2)
Basos: 0 %
EOS (ABSOLUTE): 0.2 10*3/uL (ref 0.0–0.4)
Eos: 2 %
HEMOGLOBIN: 13.4 g/dL (ref 11.1–15.9)
Hematocrit: 40.3 % (ref 34.0–46.6)
IMMATURE GRANS (ABS): 0 10*3/uL (ref 0.0–0.1)
IMMATURE GRANULOCYTES: 0 %
LYMPHS: 35 %
Lymphocytes Absolute: 3.2 10*3/uL — ABNORMAL HIGH (ref 0.7–3.1)
MCH: 27.6 pg (ref 26.6–33.0)
MCHC: 33.3 g/dL (ref 31.5–35.7)
MCV: 83 fL (ref 79–97)
MONOCYTES: 6 %
Monocytes Absolute: 0.5 10*3/uL (ref 0.1–0.9)
NEUTROS PCT: 57 %
Neutrophils Absolute: 5.2 10*3/uL (ref 1.4–7.0)
Platelets: 322 10*3/uL (ref 150–379)
RBC: 4.85 x10E6/uL (ref 3.77–5.28)
RDW: 14.2 % (ref 12.3–15.4)
WBC: 9.2 10*3/uL (ref 3.4–10.8)

## 2017-03-23 LAB — MAGNESIUM: MAGNESIUM: 0.6 mg/dL — AB (ref 1.6–2.3)

## 2017-03-23 LAB — PROTIME-INR
INR: 1.1 (ref 0.8–1.2)
PROTHROMBIN TIME: 11.7 s (ref 9.1–12.0)

## 2017-03-23 LAB — BASIC METABOLIC PANEL
BUN/Creatinine Ratio: 14 (ref 9–23)
BUN: 8 mg/dL (ref 6–24)
CALCIUM: 7.3 mg/dL — AB (ref 8.7–10.2)
CHLORIDE: 103 mmol/L (ref 96–106)
CO2: 27 mmol/L (ref 18–29)
Creatinine, Ser: 0.57 mg/dL (ref 0.57–1.00)
GFR calc Af Amer: 122 mL/min/{1.73_m2} (ref >59)
GFR, EST NON AFRICAN AMERICAN: 106 mL/min/{1.73_m2} (ref >59)
Glucose: 119 mg/dL — ABNORMAL HIGH (ref 65–99)
Potassium: 3.1 mmol/L — ABNORMAL LOW (ref 3.5–5.2)
Sodium: 149 mmol/L — ABNORMAL HIGH (ref 134–144)

## 2017-03-23 MED ORDER — ASPIRIN EC 81 MG PO TBEC: 81.0000 mg | DELAYED_RELEASE_TABLET | Freq: Every day | ORAL | 3 refills | Status: AC

## 2017-03-23 MED ORDER — POTASSIUM CHLORIDE CRYS ER 20 MEQ PO TBCR: 40.0000 meq | EXTENDED_RELEASE_TABLET | Freq: Every day | ORAL | 3 refills | Status: DC

## 2017-03-23 MED ORDER — MAGNESIUM OXIDE 400 MG PO CAPS: 400.0000 mg | ORAL_CAPSULE | Freq: Three times a day (TID) | ORAL | 0 refills | Status: DC

## 2017-03-23 NOTE — Telephone Encounter (Signed)
While giving results and plan to patient, Dr End spoke with patient as well. Patient has not had any new reactions since taking Aspirin 81 mg by mouth in the office yesterday. He gave verbal orders to stop omeprazole, cancel heart cath for this Friday, BMET and Mg in 1 week, office visit in 2-3 weeks and start Aspirin 81mg  by mouth once a day. Patient needed afternoon appt as she is legally blind and her husband will drive her. Rescheduled appt to 04/14/17 at 4pm at [facility] office.  Patient verbalized understanding of appt date, time and location and had no problem with going to [facility]. She will go to [facility] on 03/29/17 for repeat lab work. Orders entered. Called [facility] Cath lab and heart cath cancelled.

## 2017-03-23 NOTE — Telephone Encounter (Signed)
I spoke with Ms. Charters this morning regarding her lab results. These were notable for mild hypokalemia but marked hypomagnesemia that could account for her palpitations and PSVT/NSVT. The electrolyte abnormalities may also complicate catheterization in 2 days. We have agreed to replete K and Mg and repeat a BMP and Mg next week. We will defer the catheterization for the time being. If her palpitations have resolved with adequate electrolyte repletion, we could consider a stress test in place of catheterization.  Nelva Bush, MD [facility] Pager: [phone]

## 2017-03-23 NOTE — Telephone Encounter (Signed)
-----   Message from Nelva Bush, MD sent at 03/23/2017  7:21 AM EDT ----- Please let Yolanda Gallegos know that her potassium is slightly low and her magnesium is very low. These findings may explain her palpitations and findings on recent event monitor. Please start her on magnesium oxide 400 mg TID and have her increase potassium chloride to 40 mEq daily. She should also stop taking pantoprazole, as this could be contributing to her low potassium. She should come in early for her cath on Friday so that we can repeat a BMP and magnesium level before the procedure. Thanks.

## 2017-03-24 ENCOUNTER — Ambulatory Visit: Payer: Medicare HMO | Admitting: Endocrinology

## 2017-03-24 LAB — ECHOCARDIOGRAM COMPLETE
HEIGHTINCHES: 66 in
Weight: 4184 oz

## 2017-03-25 ENCOUNTER — Ambulatory Visit (HOSPITAL_COMMUNITY): Admission: RE | Admit: 2017-03-25 | Payer: Medicare HMO | Source: Ambulatory Visit | Admitting: Internal Medicine

## 2017-03-25 ENCOUNTER — Encounter (HOSPITAL_COMMUNITY): Admission: RE | Payer: Self-pay | Source: Ambulatory Visit

## 2017-03-25 SURGERY — LEFT HEART CATH AND CORONARY ANGIOGRAPHY
Anesthesia: LOCAL

## 2017-03-28 ENCOUNTER — Other Ambulatory Visit: Payer: Medicare HMO

## 2017-03-29 ENCOUNTER — Encounter: Payer: Self-pay | Admitting: Internal Medicine

## 2017-03-29 ENCOUNTER — Telehealth: Payer: Self-pay | Admitting: Internal Medicine

## 2017-03-29 NOTE — Telephone Encounter (Signed)
Please have Ms. Nydam try taking famotidine 20 mg BID. If this does not improve her symptoms, she can restart omeprazole, though we will need to be vigilant watching her magnesium level.  Nelva Bush, MD [facility] Pager: [phone]

## 2017-03-29 NOTE — Telephone Encounter (Signed)
No answer. Left message to call back.   

## 2017-03-29 NOTE — Telephone Encounter (Signed)
Received incoming call from patient. She's started experiencing her heartburn and reflux again since coming off her omeprazole on 03/23/17 due to low magnesium. She gets severe pains across the top of her stomach after eating and drinking coffee. She called our oncall service on Saturday who advised her to take Tums. She said her name was Yolanda Gallegos, although I cannot find the documentation at this time. Patient has been taking Tums but it takes a while to help and the feeling keeps coming back. Patient was supposed to get repeat Mg and BMP today; however, her son had an emergency and she had to go to [facility]. She will go to [facility] tomorrow for repeat labs. Advised I will make Dr End aware and let her know his recommendations.

## 2017-03-29 NOTE — Telephone Encounter (Signed)
Pt calling stating she is having really bad acid reflux and was taken off her medication by [place] Would like to know what can she take for this  Please advise.

## 2017-03-30 ENCOUNTER — Other Ambulatory Visit
Admission: RE | Admit: 2017-03-30 | Discharge: 2017-03-30 | Disposition: A | Discharge status: Home / Self Care | Payer: Medicare HMO | Source: Ambulatory Visit | Attending: Internal Medicine | Admitting: Internal Medicine

## 2017-03-30 DIAGNOSIS — E876 Hypokalemia: Secondary | ICD-10-CM | POA: Diagnosis not present

## 2017-03-30 DIAGNOSIS — R002 Palpitations: Secondary | ICD-10-CM | POA: Diagnosis not present

## 2017-03-30 DIAGNOSIS — Z79899 Other long term (current) drug therapy: Secondary | ICD-10-CM | POA: Diagnosis not present

## 2017-03-30 LAB — BASIC METABOLIC PANEL
ANION GAP: 8 (ref 5–15)
BUN: 13 mg/dL (ref 6–20)
CALCIUM: 9.3 mg/dL (ref 8.9–10.3)
CO2: 28 mmol/L (ref 22–32)
Chloride: 104 mmol/L (ref 101–111)
Creatinine, Ser: 0.7 mg/dL (ref 0.44–1.00)
GFR calc Af Amer: >60 mL/min (ref >60)
Glucose, Bld: 102 mg/dL — ABNORMAL HIGH (ref 65–99)
POTASSIUM: 4.2 mmol/L (ref 3.5–5.1)
SODIUM: 140 mmol/L (ref 135–145)

## 2017-03-30 LAB — MAGNESIUM: MAGNESIUM: 2.3 mg/dL (ref 1.7–2.4)

## 2017-03-30 MED ORDER — FAMOTIDINE 20 MG PO TABS: 20.0000 mg | ORAL_TABLET | Freq: Two times a day (BID) | ORAL | 3 refills | Status: DC

## 2017-03-30 NOTE — Telephone Encounter (Signed)
Called patient. She verbalized understanding of famotidine 20 mg twice a day and the importance of getting lab work today in order to monitor magnesium level. She will call us back if her symptoms do not improve. Rx sent to [facility] per patient request.

## 2017-03-31 ENCOUNTER — Other Ambulatory Visit: Payer: Self-pay | Admitting: *Deleted

## 2017-03-31 ENCOUNTER — Other Ambulatory Visit: Payer: Self-pay | Admitting: Family Medicine

## 2017-03-31 MED ORDER — MAGNESIUM OXIDE 400 MG PO CAPS: 400.0000 mg | ORAL_CAPSULE | Freq: Every day | ORAL | 0 refills | Status: DC

## 2017-03-31 MED ORDER — POTASSIUM CHLORIDE CRYS ER 20 MEQ PO TBCR: 20.0000 meq | EXTENDED_RELEASE_TABLET | Freq: Every day | ORAL | 3 refills | Status: DC

## 2017-04-04 ENCOUNTER — Ambulatory Visit: Payer: Medicare HMO | Admitting: Internal Medicine

## 2017-04-08 ENCOUNTER — Ambulatory Visit (INDEPENDENT_AMBULATORY_CARE_PROVIDER_SITE_OTHER): Payer: Medicare HMO | Admitting: Endocrinology

## 2017-04-08 ENCOUNTER — Encounter: Payer: Self-pay | Admitting: Endocrinology

## 2017-04-08 VITALS — BP 132/84 | HR 78 | Ht 66.0 in | Wt 259.0 lb

## 2017-04-08 DIAGNOSIS — E042 Nontoxic multinodular goiter: Secondary | ICD-10-CM | POA: Diagnosis not present

## 2017-04-08 NOTE — Progress Notes (Signed)
Subjective:    Patient ID: Yolanda Gallegos, female    DOB: 01-Jul-1961, 56 y.o.   MRN: [id number]  HPI Pt is referred by Dr Dennard Schaumann, for hyperthyroidism.  Pt reports she was dx'ed with hyperthyroidism in 2014.  she has never been on therapy for this.  she has never had XRT to the anterior neck, or thyroid surgery.  she does not consume kelp or any other prescribed or non-prescribed thyroid medication.  she has never been on amiodarone.  She has intermitt palpitations in the chest, and assoc fatigue. Past Medical History:  Diagnosis Date  . Allergy   . Colon polyps 2015  . COPD (chronic obstructive pulmonary disease) ([place])   . Diabetes mellitus without complication ([place])   . GERD (gastroesophageal reflux disease)   . Hyperlipidemia   . Hypertension   . Legally blind in left eye, as defined in [place]   . Meningioma ([place])   . Multinodular goiter    subclinical hyperthyroidism  . Smoker   . Subclinical hyperthyroidism    due to multinodular goiter.  Confirmed on [place]ak escan 2015  . Tubular adenoma 2015    Past Surgical History:  Procedure Laterality Date  . ABDOMINAL HYSTERECTOMY  1999   left oopherectomy  . 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 BREAST BIOPSY Left 2007   benign  . [place]  . INNER EAR SURGERY Right 2001    Social History   Social History  . Marital status: Divorced    Spouse name: N/A  . Number of children: N/A  . Years of education: N/A   Occupational History  . Not on file.   Social History Main Topics  . Smoking status: Former Smoker    Packs/day: 1.50    Years: 30.00    Types: Cigarettes    Quit date: 2013  . Smokeless tobacco: Never Used  . Alcohol use No  . Drug use: No  . Sexual activity: Not on file     Comment: married   Other Topics Concern  . Not on file   Social History Narrative  . No narrative on file    Current Outpatient Prescriptions on File Prior to Visit  Medication  Sig Dispense Refill  . ACCU-CHEK AVIVA PLUS test strip TEST BLOOD SUGAR TWICE DAILY TO THREE TIMES DAILY 300 each 4  . ACCU-CHEK SOFTCLIX LANCETS lancets USE AS INSTRUCTED 300 each 4  . acetaminophen (TYLENOL) 500 MG tablet Take 1,000 mg by mouth every 6 (six) hours as needed for moderate pain or headache.    . Alcohol Swabs (B-D SINGLE USE SWABS REGULAR) PADS USE AS DIRECTED AS NEEDED  FOR  FINGERSTICK/INSULIN INJECTION 300 each 4  . aspirin EC 81 MG tablet Take 1 tablet (81 mg total) by mouth daily. 90 tablet 3  . atorvastatin (LIPITOR) 40 MG tablet TAKE 1 TABLET EVERY EVENING AT BEDTIME 90 tablet 3  . Blood Glucose Monitoring Suppl (ACCU-CHEK AVIVA PLUS) w/Device KIT Checks BS bid - tid 1 kit 0  . Dulaglutide (TRULICITY) 4.68 EH/2.1YY SOPN Inject SQ weekly 12 pen 4  . famotidine (PEPCID) 20 MG tablet Take 1 tablet (20 mg total) by mouth 2 (two) times daily. 180 tablet 3  . fluticasone (FLONASE) 50 MCG/ACT nasal spray USE 2 SPRAYS IN EACH NOSTRIL EVERY DAY 48 g 3  . hydrocortisone (PROCTOZONE-HC) 2.5 % rectal cream Place 1 application rectally 2 (two) times daily as  needed for hemorrhoids or itching.    . losartan (COZAAR) 100 MG tablet TAKE 1 TABLET EVERY DAY (Patient taking differently: TAKE 1 TABLET EVERY DAY IN THE EVENING) 90 tablet 3  . Magnesium Oxide 400 MG CAPS Take 1 capsule (400 mg total) by mouth daily. 270 each 0  . metoprolol succinate (TOPROL-XL) 25 MG 24 hr tablet Take 1 tablet (25 mg total) by mouth 2 (two) times daily. 180 tablet 3  . potassium chloride SA (K-DUR,KLOR-CON) 20 MEQ tablet Take 1 tablet (20 mEq total) by mouth daily. 180 tablet 3  . terbinafine (LAMISIL) 1 % cream Apply 1 application topically daily as needed (athletes foot).    . triamcinolone ointment (KENALOG) 0.1 % APPLY TWICE A DAY FOR 10 DAYS AS NEEDED FOR RASH 454 g 1  . ZYRTEC ALLERGY 10 MG tablet Take 10 mg by mouth daily.      No current facility-administered medications on file prior to visit.      Allergies  Allergen Reactions  . Doxycycline Hives and Shortness Of Breath  . Aspirin     Facial swelling   . Glyxambi [Empagliflozin-Linagliptin] Itching  . Mobic [Meloxicam]     "feeling of paralysis"  . Sulfa Antibiotics     Per positive allergy test    Family History  Problem Relation Age of Onset  . Hypertension Mother   . Cancer Mother        cervical  . Cancer Father        leukemia  . Hyperlipidemia Sister   . Hyperlipidemia Brother   . Heart attack Brother 77  . Stroke Brother   . Leukemia Son   . Colon cancer Neg Hx   . Esophageal cancer Neg Hx   . Rectal cancer Neg Hx   . Stomach cancer Neg Hx   . Thyroid disease Neg Hx     BP 132/84 (BP Location: Left Arm, Patient Position: Sitting)   Pulse 78   Ht '5\' 6"'  (1.676 m)   Wt 259 lb (117.5 kg)   SpO2 96%   BMI 41.80 kg/m     Review of Systems denies weight loss, hoarseness, diplopia, edema, sob, diarrhea, muscle weakness, excessive diaphoresis, heat intolerance, and rhinorrhea.  She has intermitt headache, slight tremor, anxiety, easy bruising, and urinary frequency.    Objective:   Physical Exam VS: see vs page GEN: no distress HEAD: head: no deformity eyes: no periorbital swelling, no proptosis.   external nose and ears are normal mouth: no lesion seen NECK: thyroid has slight irreg surface, but I cannot palpate any nodule.  CHEST WALL: no deformity LUNGS: clear to auscultation CV: reg rate and rhythm, no murmur ABD: abdomen is soft, nontender.  no hepatosplenomegaly.  not distended.  no hernia. MUSCULOSKELETAL: muscle bulk and strength are grossly normal.  no obvious joint swelling.  gait is steady with a cane EXTEMITIES: no deformity.  no ulcer on the feet.  feet are of normal color and temp.  no edema PULSES: dorsalis pedis intact bilat.  no carotid bruit NEURO:  cn 2-12 grossly intact, except for vision loss.   readily moves all 4's.  sensation is intact to touch on the feet.  No  tremor SKIN:  Normal texture and temperature.  No rash or suspicious lesion is visible.  Not diaphoretic NODES:  None palpable at the neck PSYCH: alert, well-oriented.  Does not appear anxious nor depressed.  nuc med scan (2015): Multinodular thyroid gland.  Normal 24 hr radio iodine  uptake of 15%.  Lab Results  Component Value Date   TSH 0.03 (L) 02/14/2017   T3TOTAL 147.0 12/29/2015   T4TOTAL 8.3 12/29/2015   I have reviewed outside records, and summarized: Pt was noted to have hyperthyroidism, and referred here. Main problem addressed was chest pain.      Assessment & Plan:  Multinodular goiter, new to me: usually hereditary Hyperthyroidism, due to the goiter  Patient Instructions  let's check a thyroid "scan" (a special, but easy and painless type of thyroid x ray).  It works like this: you go to the x-ray department of the hospital to swallow a pill, which contains a miniscule amount of radiation.  You will not notice any symptoms from this.  You will go back to the x-ray department the next day, to lie down in front of a camera.  The results of this will be sent to me.   Based on the results, i hope to order for you a treatment pill of radioactive iodine.  Although it is a larger amount of radiation, you will again notice no symptoms from this.  The pill is gone from your body in a few days (during which you should stay away from other people), but takes several months to work.  Therefore, please return here approximately 6-8 weeks after the treatment.  This treatment has been available for many years, and the only known side-effect is an underactive thyroid.  It is possible that i would eventually prescribe for you a thyroid hormone pill, which is very inexpensive.  You don't have to worry about side-effects of this thyroid hormone pill, because it is the same molecule your thyroid makes.

## 2017-04-08 NOTE — Patient Instructions (Signed)

## 2017-04-14 ENCOUNTER — Encounter: Payer: Self-pay | Admitting: *Deleted

## 2017-04-14 ENCOUNTER — Ambulatory Visit (INDEPENDENT_AMBULATORY_CARE_PROVIDER_SITE_OTHER): Payer: Medicare HMO | Admitting: Internal Medicine

## 2017-04-14 ENCOUNTER — Encounter: Payer: Self-pay | Admitting: Internal Medicine

## 2017-04-14 VITALS — BP 120/72 | HR 98 | Ht 66.0 in | Wt 261.8 lb

## 2017-04-14 DIAGNOSIS — I472 Ventricular tachycardia, unspecified: Secondary | ICD-10-CM

## 2017-04-14 DIAGNOSIS — R0789 Other chest pain: Secondary | ICD-10-CM

## 2017-04-14 NOTE — Patient Instructions (Signed)
Medication Instructions:  Your physician recommends that you continue on your current medications as directed. Please refer to the Current Medication list given to you today.   Labwork: BMET/CBCd/PT/INR/Magnesium Level today  Testing/Procedures: Your physician has requested that you have a cardiac catheterization. Cardiac catheterization is used to diagnose and/or treat various heart conditions. Doctors may recommend this procedure for a number of different reasons. The most common reason is to evaluate chest pain. Chest pain can be a symptom of coronary artery disease (CAD), and cardiac catheterization can show whether plaque is narrowing or blocking your heart's arteries. This procedure is also used to evaluate the valves, as well as measure the blood flow and oxygen levels in different parts of your heart. For further information please visit [email or web address]. Please follow instruction sheet, as given.  Friday April 22, 2017   Follow-Up: A follow-up appointment will be determined after your cardiac catheterization.       If you need a refill on your cardiac medications before your next appointment, please call your pharmacy.

## 2017-04-14 NOTE — Progress Notes (Signed)
Follow-up Outpatient Visit Date: 04/14/2017  Primary Care Provider: [name], MD [address] [place] [address]  Chief Complaint: Palpitations  HPI:  Yolanda Gallegos is a 56 y.o. year-old female with history of diabetes mellitus, hypertension, hyperlipidemia, subclinical hyperthyroidism, and GERD, who presents for follow-up of palpitations. Last saw her on 03/22/17, which time she continued to have palpitations. Abs were notable for severe hypomagnesemia for which we started aggressive magnesium supplementation. Her magnesium has since return to baseline. She notes that sustained fluttering with accompanying lightheadedness has improved. She notes that her acid reflux has been less well-controlled since switching from a PPI to H2 blocker. She has not had any further episodes of chest pain. She also denies shortness of breath, orthopnea, PND, and edema. She previously complained of tingling in her arms and legs; this has also resolved with magnesium supplementation. She remains on metoprolol, which she is tolerating well. Over the weekend, she began having sore throat and other URI symptoms after visiting her son, who is currently hospitalized at [facility].  We have previously discussed cardiac catheterization due to her atypical chest pain with lightheadedness and nonsustained ventricular tachycardia noted on event monitoring. This was delayed due to possible aspirin allergy as well as marked hypomagnesemia. Yolanda Gallegos has tolerated low-dose aspirin well. Previously noted periorbital edema has since resolved.  --------------------------------------------------------------------------------------------------  Cardiovascular Problems:  Palpitations  Atypical chest pain  Risk Factors:  Diabetes mellitus, hypertension, hyperlipidemia, obesity, and history of tobacco use  Cath/PCI:  None  CV Surgery:  None  EP Procedures and Devices:  14-day event monitor (02/17/17):  Predominantly sinus rhythm with rare PSVT and NSVT, as well as rare PACs and PVCs.  Non-Invasive Evaluation(s):  None available; patient reports negative stress test several years ago  Recent CV Pertinent Labs: Lab Results  Component Value Date   CHOL 175 02/14/2017   HDL 39 (L) 02/14/2017   LDLCALC 104 (H) 02/14/2017   TRIG 161 (H) 02/14/2017   CHOLHDL 4.5 02/14/2017   INR 1.1 03/22/2017   K 4.2 03/30/2017   MG 2.3 03/30/2017   BUN 13 03/30/2017   BUN 8 03/22/2017   CREATININE 0.70 03/30/2017   CREATININE 0.53 02/25/2017    Past medical and surgical history were reviewed and updated in EPIC.  Outpatient Encounter Prescriptions as of 04/14/2017  Medication Sig  . acetaminophen (TYLENOL) 500 MG tablet Take 1,000 mg by mouth every 6 (six) hours as needed for moderate pain or headache.  Marland Kitchen aspirin EC 81 MG tablet Take 1 tablet (81 mg total) by mouth daily.  Marland Kitchen atorvastatin (LIPITOR) 40 MG tablet Take 1 tablet by mouth at bedtime.  . Dulaglutide (TRULICITY) 4.00 QQ/7.6PP SOPN Inject SQ weekly  . famotidine (PEPCID) 20 MG tablet Take 1 tablet (20 mg total) by mouth 2 (two) times daily.  . fluticasone (FLONASE) 50 MCG/ACT nasal spray USE 2 SPRAYS IN EACH NOSTRIL EVERY DAY  . hydrocortisone (PROCTOZONE-HC) 2.5 % rectal cream Place 1 application rectally 2 (two) times daily as needed for hemorrhoids or itching.  . losartan (COZAAR) 100 MG tablet Take 1 tablet by mouth every evening.  . Magnesium Oxide 400 MG CAPS Take 1 capsule (400 mg total) by mouth daily.  . metoprolol succinate (TOPROL-XL) 25 MG 24 hr tablet Take 1 tablet (25 mg total) by mouth 2 (two) times daily.  . potassium chloride SA (K-DUR,KLOR-CON) 20 MEQ tablet Take 1 tablet (20 mEq total) by mouth daily.  Marland Kitchen terbinafine (LAMISIL) 1 %  cream Apply 1 application topically daily as needed (athletes foot).  . triamcinolone ointment (KENALOG) 0.1 % APPLY TWICE A DAY FOR 10 DAYS AS NEEDED FOR RASH  . ZYRTEC ALLERGY 10 MG tablet  Take 10 mg by mouth daily.   . [DISCONTINUED] ACCU-CHEK AVIVA PLUS test strip TEST BLOOD SUGAR TWICE DAILY TO THREE TIMES DAILY  . [DISCONTINUED] ACCU-CHEK SOFTCLIX LANCETS lancets USE AS INSTRUCTED  . [DISCONTINUED] Alcohol Swabs (B-D SINGLE USE SWABS REGULAR) PADS USE AS DIRECTED AS NEEDED  FOR  FINGERSTICK/INSULIN INJECTION  . [DISCONTINUED] atorvastatin (LIPITOR) 40 MG tablet TAKE 1 TABLET EVERY EVENING AT BEDTIME  . [DISCONTINUED] Blood Glucose Monitoring Suppl (ACCU-CHEK AVIVA PLUS) w/Device KIT Checks BS bid - tid  . [DISCONTINUED] losartan (COZAAR) 100 MG tablet TAKE 1 TABLET EVERY DAY (Patient taking differently: TAKE 1 TABLET EVERY DAY IN THE EVENING)   No facility-administered encounter medications on file as of 04/14/2017.     Allergies: Doxycycline; Aspirin; Glyxambi [empagliflozin-linagliptin]; Mobic [meloxicam]; and Sulfa antibiotics  Social History   Social History  . Marital status: Divorced    Spouse name: N/A  . Number of children: N/A  . Years of education: N/A   Occupational History  . Not on file.   Social History Main Topics  . Smoking status: Former Smoker    Packs/day: 1.50    Years: 30.00    Types: Cigarettes    Quit date: 2013  . Smokeless tobacco: Never Used  . Alcohol use No  . Drug use: No  . Sexual activity: Not on file     Comment: married   Other Topics Concern  . Not on file   Social History Narrative  . No narrative on file    Family History  Problem Relation Age of Onset  . Hypertension Mother   . Cancer Mother        cervical  . Cancer Father        leukemia  . Hyperlipidemia Sister   . Hyperlipidemia Brother   . Heart attack Brother 29  . Stroke Brother   . Leukemia Son   . Colon cancer Neg Hx   . Esophageal cancer Neg Hx   . Rectal cancer Neg Hx   . Stomach cancer Neg Hx   . Thyroid disease Neg Hx     Review of Systems: A 12-system review of systems was performed and was negative except as noted in the  HPI.  --------------------------------------------------------------------------------------------------  Physical Exam: BP 120/72   Pulse 98   Ht '5\' 6"'  (1.676 m)   Wt 261 lb 12.8 oz (118.8 kg)   SpO2 97%   BMI 42.26 kg/m   General:  Obese woman, seated comfortably on the exam table. HEENT: No conjunctival pallor or scleral icterus.  Moist mucous membranes.  OP clear. Neck: Supple without lymphadenopathy, thyromegaly, JVD, or HJR. Lungs: Normal work of breathing.  Clear to auscultation bilaterally without wheezes or crackles. Heart: Regular rate and rhythm with rare extrasystoles. No murmurs, rubs, or gallops.  Unable to assess PMI due to body habitus. Abd: Bowel sounds present.  Obese, soft, nontender, nondistended. Unable to assess HSM due to body habitus. Ext: No lower extremity edema.  Radial, PT, and DP pulses are 2+ bilaterally. Skin: Warm and dry without rash.  Lab Results  Component Value Date   WBC 9.2 03/22/2017   HGB 13.6 02/14/2017   HCT 40.3 03/22/2017   MCV 83 03/22/2017   PLT 322 03/22/2017    Lab Results  Component  Value Date   NA 140 03/30/2017   K 4.2 03/30/2017   CL 104 03/30/2017   CO2 28 03/30/2017   BUN 13 03/30/2017   CREATININE 0.70 03/30/2017   GLUCOSE 102 (H) 03/30/2017   ALT 17 02/14/2017    Lab Results  Component Value Date   CHOL 175 02/14/2017   HDL 39 (L) 02/14/2017   LDLCALC 104 (H) 02/14/2017   TRIG 161 (H) 02/14/2017   CHOLHDL 4.5 02/14/2017    --------------------------------------------------------------------------------------------------  ASSESSMENT AND PLAN: Palpitations with nonsustained VT and paroxysmal supraventricular tachycardia Palpitations have improved but not completely resolved with repletion of magnesium. Given her risk factors including diabetes, hypertension, hyperlipidemia, and obesity, I still feel that it is important to exclude ischemia. We have discussed further evaluation options, including noninvasive  testing and cardiac catheterization. We have agreed to proceed with left heart catheterization asked week. I have reviewed the risks, indications, and alternatives to cardiac catheterization, possible angioplasty, and stenting with the patient. Risks include but are not limited to bleeding, infection, vascular injury, stroke, myocardial infection, arrhythmia, kidney injury, radiation-related injury in the case of prolonged fluoroscopy use, emergency cardiac surgery, and death. The patient understands the risks of serious complication is 1-2 in 6154 with diagnostic cardiac cath and 1-2% or less with angioplasty/stenting. We will repeat BMP, CBC, and INR in anticipation of catheterization. Ms. Strnad should continue with aspirin and metoprolol.  Atypical chest pain Overall, symptoms have improved, though she continues to have some discomfort most reminiscent of her GERD. As mentioned above, given her multiple cardiac risk factors and an SVT noted on monitor, we will proceed with cardiac catheterization to exclude significant ischemia. No medication changes at this time.  Hypertension Blood pressure is quite well controlled today. She is tolerating her current medication regimen well. We will not make any alterations today.  Follow-up: Received with cardiac catheterization next week. Clinic follow-up to be determined based on results of catheterization.  Nelva Bush, MD 04/15/2017 7:25 AM

## 2017-04-15 ENCOUNTER — Other Ambulatory Visit: Payer: Self-pay | Admitting: Internal Medicine

## 2017-04-15 ENCOUNTER — Encounter: Payer: Self-pay | Admitting: Internal Medicine

## 2017-04-15 DIAGNOSIS — I472 Ventricular tachycardia, unspecified: Secondary | ICD-10-CM

## 2017-04-15 DIAGNOSIS — R0789 Other chest pain: Secondary | ICD-10-CM

## 2017-04-15 LAB — PROTIME-INR
INR: 1 (ref 0.8–1.2)
PROTHROMBIN TIME: 10.2 s (ref 9.1–12.0)

## 2017-04-15 LAB — CBC WITH DIFFERENTIAL/PLATELET
Basophils Absolute: 0 10*3/uL (ref 0.0–0.2)
Basos: 0 %
EOS (ABSOLUTE): 0.3 10*3/uL (ref 0.0–0.4)
Eos: 2 %
HEMATOCRIT: 41.2 % (ref 34.0–46.6)
HEMOGLOBIN: 13.7 g/dL (ref 11.1–15.9)
Immature Grans (Abs): 0 10*3/uL (ref 0.0–0.1)
Immature Granulocytes: 0 %
LYMPHS ABS: 3.8 10*3/uL — AB (ref 0.7–3.1)
Lymphs: 37 %
MCH: 28.3 pg (ref 26.6–33.0)
MCHC: 33.3 g/dL (ref 31.5–35.7)
MCV: 85 fL (ref 79–97)
MONOCYTES: 9 %
Monocytes Absolute: 0.9 10*3/uL (ref 0.1–0.9)
NEUTROS ABS: 5.3 10*3/uL (ref 1.4–7.0)
Neutrophils: 52 %
Platelets: 305 10*3/uL (ref 150–379)
RBC: 4.84 x10E6/uL (ref 3.77–5.28)
RDW: 14.2 % (ref 12.3–15.4)
WBC: 10.3 10*3/uL (ref 3.4–10.8)

## 2017-04-15 LAB — BASIC METABOLIC PANEL
BUN / CREAT RATIO: 21 (ref 9–23)
BUN: 12 mg/dL (ref 6–24)
CO2: 26 mmol/L (ref 18–29)
Calcium: 9.9 mg/dL (ref 8.7–10.2)
Chloride: 102 mmol/L (ref 96–106)
Creatinine, Ser: 0.58 mg/dL (ref 0.57–1.00)
GFR calc non Af Amer: 106 mL/min/{1.73_m2} (ref >59)
GFR, EST AFRICAN AMERICAN: 122 mL/min/{1.73_m2} (ref >59)
Glucose: 97 mg/dL (ref 65–99)
Potassium: 4.2 mmol/L (ref 3.5–5.2)
Sodium: 144 mmol/L (ref 134–144)

## 2017-04-15 LAB — MAGNESIUM: Magnesium: 2.2 mg/dL (ref 1.6–2.3)

## 2017-04-19 ENCOUNTER — Telehealth: Payer: Self-pay

## 2017-04-19 NOTE — Telephone Encounter (Signed)
Call placed to Pt.  Pt states she received heart cath information letter.  Discussed taking ASA morning of procedure.  All Pt questions answered.

## 2017-04-22 ENCOUNTER — Encounter (HOSPITAL_COMMUNITY)
Admission: RE | Disposition: A | Discharge status: Home / Self Care | Payer: Self-pay | Source: Ambulatory Visit | Attending: Internal Medicine

## 2017-04-22 ENCOUNTER — Ambulatory Visit (HOSPITAL_COMMUNITY)
Admission: RE | Admit: 2017-04-22 | Discharge: 2017-04-22 | Disposition: A | Discharge status: Home / Self Care | Payer: Medicare HMO | Source: Ambulatory Visit | Attending: Internal Medicine | Admitting: Internal Medicine

## 2017-04-22 DIAGNOSIS — E669 Obesity, unspecified: Secondary | ICD-10-CM | POA: Diagnosis not present

## 2017-04-22 DIAGNOSIS — I472 Ventricular tachycardia, unspecified: Secondary | ICD-10-CM

## 2017-04-22 DIAGNOSIS — I471 Supraventricular tachycardia: Secondary | ICD-10-CM | POA: Insufficient documentation

## 2017-04-22 DIAGNOSIS — E785 Hyperlipidemia, unspecified: Secondary | ICD-10-CM | POA: Diagnosis not present

## 2017-04-22 DIAGNOSIS — Z7951 Long term (current) use of inhaled steroids: Secondary | ICD-10-CM | POA: Insufficient documentation

## 2017-04-22 DIAGNOSIS — Z7982 Long term (current) use of aspirin: Secondary | ICD-10-CM | POA: Diagnosis not present

## 2017-04-22 DIAGNOSIS — I10 Essential (primary) hypertension: Secondary | ICD-10-CM | POA: Diagnosis not present

## 2017-04-22 DIAGNOSIS — K219 Gastro-esophageal reflux disease without esophagitis: Secondary | ICD-10-CM | POA: Diagnosis not present

## 2017-04-22 DIAGNOSIS — Z6841 Body Mass Index (BMI) 40.0 and over, adult: Secondary | ICD-10-CM | POA: Insufficient documentation

## 2017-04-22 DIAGNOSIS — Z87891 Personal history of nicotine dependence: Secondary | ICD-10-CM | POA: Insufficient documentation

## 2017-04-22 DIAGNOSIS — R079 Chest pain, unspecified: Secondary | ICD-10-CM

## 2017-04-22 DIAGNOSIS — Z8249 Family history of ischemic heart disease and other diseases of the circulatory system: Secondary | ICD-10-CM | POA: Insufficient documentation

## 2017-04-22 DIAGNOSIS — R0789 Other chest pain: Secondary | ICD-10-CM | POA: Diagnosis not present

## 2017-04-22 DIAGNOSIS — I4729 Other ventricular tachycardia: Secondary | ICD-10-CM

## 2017-04-22 DIAGNOSIS — E119 Type 2 diabetes mellitus without complications: Secondary | ICD-10-CM | POA: Diagnosis not present

## 2017-04-22 HISTORY — PX: LEFT HEART CATH AND CORONARY ANGIOGRAPHY: CATH118249

## 2017-04-22 LAB — GLUCOSE, CAPILLARY
GLUCOSE-CAPILLARY: 81 mg/dL (ref 65–99)
Glucose-Capillary: 87 mg/dL (ref 65–99)
Glucose-Capillary: 79 mg/dL (ref 65–99)
Glucose-Capillary: 86 mg/dL (ref 65–99)
Glucose-Capillary: 96 mg/dL (ref 65–99)

## 2017-04-22 LAB — MAGNESIUM: MAGNESIUM: 2.1 mg/dL (ref 1.7–2.4)

## 2017-04-22 SURGERY — LEFT HEART CATH AND CORONARY ANGIOGRAPHY
Anesthesia: LOCAL

## 2017-04-22 MED ORDER — VERAPAMIL HCL 2.5 MG/ML IV SOLN
INTRAVENOUS | Status: AC
Start: 2017-04-22 — End: ?
  Filled 2017-04-22: qty 2

## 2017-04-22 MED ORDER — IOPAMIDOL (ISOVUE-370) INJECTION 76%
INTRAVENOUS | Status: DC | PRN
  Administered 2017-04-22: 70 mL via INTRA_ARTERIAL

## 2017-04-22 MED ORDER — HEPARIN (PORCINE) IN NACL 2-0.9 UNIT/ML-% IJ SOLN
INTRAMUSCULAR | Status: AC
  Filled 2017-04-22: qty 1000

## 2017-04-22 MED ORDER — MIDAZOLAM HCL 2 MG/2ML IJ SOLN
INTRAMUSCULAR | Status: DC | PRN
  Administered 2017-04-22: 1 mg via INTRAVENOUS

## 2017-04-22 MED ORDER — SODIUM CHLORIDE 0.9% FLUSH: 3.0000 mL | INTRAVENOUS | Status: DC | PRN

## 2017-04-22 MED ORDER — FENTANYL CITRATE (PF) 100 MCG/2ML IJ SOLN
INTRAMUSCULAR | Status: DC | PRN
  Administered 2017-04-22: 25 ug via INTRAVENOUS

## 2017-04-22 MED ORDER — HEPARIN SODIUM (PORCINE) 1000 UNIT/ML IJ SOLN
INTRAMUSCULAR | Status: AC
  Filled 2017-04-22: qty 1

## 2017-04-22 MED ORDER — ASPIRIN 81 MG PO CHEW: 81.0000 mg | CHEWABLE_TABLET | ORAL | Status: DC

## 2017-04-22 MED ORDER — HEPARIN (PORCINE) IN NACL 2-0.9 UNIT/ML-% IJ SOLN
INTRAMUSCULAR | Status: AC | PRN
  Administered 2017-04-22: 1000 mL

## 2017-04-22 MED ORDER — SODIUM CHLORIDE 0.9 % IV SOLN: 250.0000 mL | INTRAVENOUS | Status: DC | PRN

## 2017-04-22 MED ORDER — FENTANYL CITRATE (PF) 100 MCG/2ML IJ SOLN
INTRAMUSCULAR | Status: AC
  Filled 2017-04-22: qty 2

## 2017-04-22 MED ORDER — SODIUM CHLORIDE 0.9% FLUSH: 3.0000 mL | Freq: Two times a day (BID) | INTRAVENOUS | Status: DC

## 2017-04-22 MED ORDER — SODIUM CHLORIDE 0.9 % IV SOLN
250.0000 mL | INTRAVENOUS | Status: DC | PRN
Start: 2017-04-22 — End: 2017-04-22

## 2017-04-22 MED ORDER — LIDOCAINE HCL 1 % IJ SOLN
INTRAMUSCULAR | Status: AC
  Filled 2017-04-22: qty 20

## 2017-04-22 MED ORDER — SODIUM CHLORIDE 0.9 % WEIGHT BASED INFUSION: 1.0000 mL/kg/h | INTRAVENOUS | Status: DC

## 2017-04-22 MED ORDER — HEPARIN SODIUM (PORCINE) 1000 UNIT/ML IJ SOLN
INTRAMUSCULAR | Status: DC | PRN
  Administered 2017-04-22: 5000 [IU] via INTRAVENOUS

## 2017-04-22 MED ORDER — MIDAZOLAM HCL 2 MG/2ML IJ SOLN
INTRAMUSCULAR | Status: AC
  Filled 2017-04-22: qty 2

## 2017-04-22 MED ORDER — SODIUM CHLORIDE 0.9 % IV SOLN: INTRAVENOUS | Status: DC

## 2017-04-22 MED ORDER — LIDOCAINE HCL (PF) 1 % IJ SOLN
INTRAMUSCULAR | Status: DC | PRN
Start: 2017-04-22 — End: 2017-04-22
  Administered 2017-04-22: 2 mL

## 2017-04-22 MED ORDER — METOPROLOL SUCCINATE ER 50 MG PO TB24: 50.0000 mg | ORAL_TABLET | Freq: Two times a day (BID) | ORAL | 3 refills | Status: DC

## 2017-04-22 MED ORDER — IOPAMIDOL (ISOVUE-370) INJECTION 76%
INTRAVENOUS | Status: AC
  Filled 2017-04-22: qty 100

## 2017-04-22 MED ORDER — HEPARIN (PORCINE) IN NACL 2-0.9 UNIT/ML-% IJ SOLN
INTRAMUSCULAR | Status: DC | PRN
  Administered 2017-04-22: 10 mL via INTRA_ARTERIAL

## 2017-04-22 MED ORDER — SODIUM CHLORIDE 0.9 % WEIGHT BASED INFUSION
3.0000 mL/kg/h | INTRAVENOUS | Status: DC
  Administered 2017-04-22: 3 mL/kg/h via INTRAVENOUS

## 2017-04-22 SURGICAL SUPPLY — 13 items
CATH IMPULSE 5F ANG/FL3.5 (CATHETERS) ×1 IMPLANT
COVER PRB 48X5XTLSCP FOLD TPE (BAG) IMPLANT
COVER PROBE 5X48 (BAG) ×2
DEVICE RAD COMP TR BAND LRG (VASCULAR PRODUCTS) ×1 IMPLANT
GLIDESHEATH SLEND A-KIT 6F 22G (SHEATH) ×1 IMPLANT
GUIDEWIRE INQWIRE 1.5J.035X260 (WIRE) IMPLANT
HOVERMATT SINGLE USE (MISCELLANEOUS) ×1 IMPLANT
INQWIRE 1.5J .035X260CM (WIRE) ×2
KIT HEART LEFT (KITS) ×2 IMPLANT
PACK CARDIAC CATHETERIZATION (CUSTOM PROCEDURE TRAY) ×2 IMPLANT
SYR MEDRAD MARK V 150ML (SYRINGE) ×2 IMPLANT
TRANSDUCER W/STOPCOCK (MISCELLANEOUS) ×2 IMPLANT
TUBING CIL FLEX 10 FLL-RA (TUBING) ×2 IMPLANT

## 2017-04-22 NOTE — Brief Op Note (Signed)
Brief Cardiac Catheterization Note  Date: 04/22/2017 Time: 4:06 PM  PATIENT:  Yolanda Gallegos  56 y.o. female  PRE-OPERATIVE DIAGNOSIS:  atypical cp, vtach  POST-OPERATIVE DIAGNOSIS: same  PROCEDURE:  Procedure(s): Left Heart Cath and Coronary Angiography (N/A)  SURGEON:  Surgeon(s) and Role:    * Azaria Bartell, Harrell Gave, MD - Primary  FINDINGS: 1.  No significant CAD. 2.  Low-normal LV contraction.  RECOMMENDATIONS: 1.  Medical therapy.  Nelva Bush, MD [facility] Pager: [phone]

## 2017-04-22 NOTE — Interval H&P Note (Signed)
History and Physical Interval Note:  04/22/2017 2:59 PM  Yolanda Gallegos  has presented today for surgery, with the diagnosis of atypical chest pain and ventricular tachycardia. The various methods of treatment have been discussed with the patient and family. After consideration of risks, benefits and other options for treatment, the patient has consented to  Procedure(s): Left Heart Cath and Coronary Angiography (N/A) as a surgical intervention .  The patient's history has been reviewed, patient examined, no change in status, stable for surgery.  I have reviewed the patient's chart and labs.  Questions were answered to the patient's satisfaction.    Cath Lab Visit (complete for each Cath Lab visit)  Clinical Evaluation Leading to the Procedure:   ACS: No.  Non-ACS:    Anginal Classification: CCS III  Anti-ischemic medical therapy: Minimal Therapy (1 class of medications)  Non-Invasive Test Results: No non-invasive testing performed  Prior CABG: No previous CABG  Yolanda Gallegos

## 2017-04-22 NOTE — Discharge Instructions (Signed)

## 2017-04-22 NOTE — H&P (View-Only) (Signed)
Follow-up Outpatient Visit Date: 04/14/2017  Primary Care Provider: [name], MD [address] [place] [address]  Chief Complaint: Palpitations  HPI:  Yolanda Gallegos is a 56 y.o. year-old female with history of diabetes mellitus, hypertension, hyperlipidemia, subclinical hyperthyroidism, and GERD, who presents for follow-up of palpitations. Last saw her on 03/22/17, which time she continued to have palpitations. Abs were notable for severe hypomagnesemia for which we started aggressive magnesium supplementation. Her magnesium has since return to baseline. She notes that sustained fluttering with accompanying lightheadedness has improved. She notes that her acid reflux has been less well-controlled since switching from a PPI to H2 blocker. She has not had any further episodes of chest pain. She also denies shortness of breath, orthopnea, PND, and edema. She previously complained of tingling in her arms and legs; this has also resolved with magnesium supplementation. She remains on metoprolol, which she is tolerating well. Over the weekend, she began having sore throat and other URI symptoms after visiting her son, who is currently hospitalized at [facility].  We have previously discussed cardiac catheterization due to her atypical chest pain with lightheadedness and nonsustained ventricular tachycardia noted on event monitoring. This was delayed due to possible aspirin allergy as well as marked hypomagnesemia. Yolanda Gallegos has tolerated low-dose aspirin well. Previously noted periorbital edema has since resolved.  --------------------------------------------------------------------------------------------------  Cardiovascular Problems:  Palpitations  Atypical chest pain  Risk Factors:  Diabetes mellitus, hypertension, hyperlipidemia, obesity, and history of tobacco use  Cath/PCI:  None  CV Surgery:  None  EP Procedures and Devices:  14-day event monitor (02/17/17):  Predominantly sinus rhythm with rare PSVT and NSVT, as well as rare PACs and PVCs.  Non-Invasive Evaluation(s):  None available; patient reports negative stress test several years ago  Recent CV Pertinent Labs: Lab Results  Component Value Date   CHOL 175 02/14/2017   HDL 39 (L) 02/14/2017   LDLCALC 104 (H) 02/14/2017   TRIG 161 (H) 02/14/2017   CHOLHDL 4.5 02/14/2017   INR 1.1 03/22/2017   K 4.2 03/30/2017   MG 2.3 03/30/2017   BUN 13 03/30/2017   BUN 8 03/22/2017   CREATININE 0.70 03/30/2017   CREATININE 0.53 02/25/2017    Past medical and surgical history were reviewed and updated in EPIC.  Outpatient Encounter Prescriptions as of 04/14/2017  Medication Sig  . acetaminophen (TYLENOL) 500 MG tablet Take 1,000 mg by mouth every 6 (six) hours as needed for moderate pain or headache.  Marland Kitchen aspirin EC 81 MG tablet Take 1 tablet (81 mg total) by mouth daily.  Marland Kitchen atorvastatin (LIPITOR) 40 MG tablet Take 1 tablet by mouth at bedtime.  . Dulaglutide (TRULICITY) 2.84 XL/2.4MW SOPN Inject SQ weekly  . famotidine (PEPCID) 20 MG tablet Take 1 tablet (20 mg total) by mouth 2 (two) times daily.  . fluticasone (FLONASE) 50 MCG/ACT nasal spray USE 2 SPRAYS IN EACH NOSTRIL EVERY DAY  . hydrocortisone (PROCTOZONE-HC) 2.5 % rectal cream Place 1 application rectally 2 (two) times daily as needed for hemorrhoids or itching.  . losartan (COZAAR) 100 MG tablet Take 1 tablet by mouth every evening.  . Magnesium Oxide 400 MG CAPS Take 1 capsule (400 mg total) by mouth daily.  . metoprolol succinate (TOPROL-XL) 25 MG 24 hr tablet Take 1 tablet (25 mg total) by mouth 2 (two) times daily.  . potassium chloride SA (K-DUR,KLOR-CON) 20 MEQ tablet Take 1 tablet (20 mEq total) by mouth daily.  Marland Kitchen terbinafine (LAMISIL) 1 %  cream Apply 1 application topically daily as needed (athletes foot).  . triamcinolone ointment (KENALOG) 0.1 % APPLY TWICE A DAY FOR 10 DAYS AS NEEDED FOR RASH  . ZYRTEC ALLERGY 10 MG tablet  Take 10 mg by mouth daily.   . [DISCONTINUED] ACCU-CHEK AVIVA PLUS test strip TEST BLOOD SUGAR TWICE DAILY TO THREE TIMES DAILY  . [DISCONTINUED] ACCU-CHEK SOFTCLIX LANCETS lancets USE AS INSTRUCTED  . [DISCONTINUED] Alcohol Swabs (B-D SINGLE USE SWABS REGULAR) PADS USE AS DIRECTED AS NEEDED  FOR  FINGERSTICK/INSULIN INJECTION  . [DISCONTINUED] atorvastatin (LIPITOR) 40 MG tablet TAKE 1 TABLET EVERY EVENING AT BEDTIME  . [DISCONTINUED] Blood Glucose Monitoring Suppl (ACCU-CHEK AVIVA PLUS) w/Device KIT Checks BS bid - tid  . [DISCONTINUED] losartan (COZAAR) 100 MG tablet TAKE 1 TABLET EVERY DAY (Patient taking differently: TAKE 1 TABLET EVERY DAY IN THE EVENING)   No facility-administered encounter medications on file as of 04/14/2017.     Allergies: Doxycycline; Aspirin; Glyxambi [empagliflozin-linagliptin]; Mobic [meloxicam]; and Sulfa antibiotics  Social History   Social History  . Marital status: Divorced    Spouse name: N/A  . Number of children: N/A  . Years of education: N/A   Occupational History  . Not on file.   Social History Main Topics  . Smoking status: Former Smoker    Packs/day: 1.50    Years: 30.00    Types: Cigarettes    Quit date: 2013  . Smokeless tobacco: Never Used  . Alcohol use No  . Drug use: No  . Sexual activity: Not on file     Comment: married   Other Topics Concern  . Not on file   Social History Narrative  . No narrative on file    Family History  Problem Relation Age of Onset  . Hypertension Mother   . Cancer Mother        cervical  . Cancer Father        leukemia  . Hyperlipidemia Sister   . Hyperlipidemia Brother   . Heart attack Brother 26  . Stroke Brother   . Leukemia Son   . Colon cancer Neg Hx   . Esophageal cancer Neg Hx   . Rectal cancer Neg Hx   . Stomach cancer Neg Hx   . Thyroid disease Neg Hx     Review of Systems: A 12-system review of systems was performed and was negative except as noted in the  HPI.  --------------------------------------------------------------------------------------------------  Physical Exam: BP 120/72   Pulse 98   Ht '5\' 6"'  (1.676 m)   Wt 261 lb 12.8 oz (118.8 kg)   SpO2 97%   BMI 42.26 kg/m   General:  Obese woman, seated comfortably on the exam table. HEENT: No conjunctival pallor or scleral icterus.  Moist mucous membranes.  OP clear. Neck: Supple without lymphadenopathy, thyromegaly, JVD, or HJR. Lungs: Normal work of breathing.  Clear to auscultation bilaterally without wheezes or crackles. Heart: Regular rate and rhythm with rare extrasystoles. No murmurs, rubs, or gallops.  Unable to assess PMI due to body habitus. Abd: Bowel sounds present.  Obese, soft, nontender, nondistended. Unable to assess HSM due to body habitus. Ext: No lower extremity edema.  Radial, PT, and DP pulses are 2+ bilaterally. Skin: Warm and dry without rash.  Lab Results  Component Value Date   WBC 9.2 03/22/2017   HGB 13.6 02/14/2017   HCT 40.3 03/22/2017   MCV 83 03/22/2017   PLT 322 03/22/2017    Lab Results  Component  Value Date   NA 140 03/30/2017   K 4.2 03/30/2017   CL 104 03/30/2017   CO2 28 03/30/2017   BUN 13 03/30/2017   CREATININE 0.70 03/30/2017   GLUCOSE 102 (H) 03/30/2017   ALT 17 02/14/2017    Lab Results  Component Value Date   CHOL 175 02/14/2017   HDL 39 (L) 02/14/2017   LDLCALC 104 (H) 02/14/2017   TRIG 161 (H) 02/14/2017   CHOLHDL 4.5 02/14/2017    --------------------------------------------------------------------------------------------------  ASSESSMENT AND PLAN: Palpitations with nonsustained VT and paroxysmal supraventricular tachycardia Palpitations have improved but not completely resolved with repletion of magnesium. Given her risk factors including diabetes, hypertension, hyperlipidemia, and obesity, I still feel that it is important to exclude ischemia. We have discussed further evaluation options, including noninvasive  testing and cardiac catheterization. We have agreed to proceed with left heart catheterization asked week. I have reviewed the risks, indications, and alternatives to cardiac catheterization, possible angioplasty, and stenting with the patient. Risks include but are not limited to bleeding, infection, vascular injury, stroke, myocardial infection, arrhythmia, kidney injury, radiation-related injury in the case of prolonged fluoroscopy use, emergency cardiac surgery, and death. The patient understands the risks of serious complication is 1-2 in 7375 with diagnostic cardiac cath and 1-2% or less with angioplasty/stenting. We will repeat BMP, CBC, and INR in anticipation of catheterization. Ms. Kohut should continue with aspirin and metoprolol.  Atypical chest pain Overall, symptoms have improved, though she continues to have some discomfort most reminiscent of her GERD. As mentioned above, given her multiple cardiac risk factors and an SVT noted on monitor, we will proceed with cardiac catheterization to exclude significant ischemia. No medication changes at this time.  Hypertension Blood pressure is quite well controlled today. She is tolerating her current medication regimen well. We will not make any alterations today.  Follow-up: Received with cardiac catheterization next week. Clinic follow-up to be determined based on results of catheterization.  Nelva Bush, MD 04/15/2017 7:25 AM

## 2017-04-25 ENCOUNTER — Encounter (HOSPITAL_COMMUNITY): Payer: Self-pay | Admitting: Internal Medicine

## 2017-05-02 ENCOUNTER — Encounter (HOSPITAL_COMMUNITY): Payer: Medicare HMO

## 2017-05-03 ENCOUNTER — Encounter (HOSPITAL_COMMUNITY): Payer: Medicare HMO

## 2017-05-11 ENCOUNTER — Other Ambulatory Visit: Payer: Self-pay | Admitting: Family Medicine

## 2017-05-27 ENCOUNTER — Ambulatory Visit: Payer: Medicare HMO | Admitting: Internal Medicine

## 2017-06-06 ENCOUNTER — Encounter (HOSPITAL_COMMUNITY): Payer: Medicare HMO

## 2017-06-07 ENCOUNTER — Encounter (HOSPITAL_COMMUNITY): Payer: Medicare HMO

## 2017-06-10 ENCOUNTER — Telehealth: Payer: Self-pay | Admitting: Endocrinology

## 2017-06-10 ENCOUNTER — Ambulatory Visit: Payer: Medicare HMO | Admitting: Internal Medicine

## 2017-06-10 NOTE — Telephone Encounter (Signed)
Routing to you °

## 2017-06-10 NOTE — Telephone Encounter (Signed)
Patient called in reference to appointment on Thursday 06/16/17 for her thyroid test. Patient stated she has severe claustrophobia and would like to know if there is a medication she can be given to relax her for the test.   Please call patient and advise.

## 2017-06-12 NOTE — Telephone Encounter (Signed)
Good news: there is no claustrophobia problem with this test

## 2017-06-13 NOTE — Telephone Encounter (Signed)
Called and notified patient that there was no risk of claustrophobia with this type of test.

## 2017-06-15 ENCOUNTER — Encounter (HOSPITAL_COMMUNITY)
Admission: RE | Admit: 2017-06-15 | Discharge: 2017-06-15 | Disposition: A | Discharge status: Home / Self Care | Payer: Medicare HMO | Source: Ambulatory Visit | Attending: Endocrinology | Admitting: Endocrinology

## 2017-06-15 DIAGNOSIS — E042 Nontoxic multinodular goiter: Secondary | ICD-10-CM | POA: Insufficient documentation

## 2017-06-15 MED ORDER — SODIUM IODIDE I 131 CAPSULE
12.0000 | Freq: Once | INTRAVENOUS | Status: AC | PRN
  Administered 2017-06-15: 12 via ORAL

## 2017-06-16 ENCOUNTER — Encounter (HOSPITAL_COMMUNITY)
Admission: RE | Admit: 2017-06-16 | Discharge: 2017-06-16 | Disposition: A | Discharge status: Home / Self Care | Payer: Medicare HMO | Source: Ambulatory Visit | Attending: Endocrinology | Admitting: Endocrinology

## 2017-06-16 DIAGNOSIS — E042 Nontoxic multinodular goiter: Secondary | ICD-10-CM | POA: Diagnosis not present

## 2017-06-16 MED ORDER — SODIUM PERTECHNETATE TC 99M INJECTION
10.0000 | Freq: Once | INTRAVENOUS | Status: AC | PRN
  Administered 2017-06-16: 10 via INTRAVENOUS

## 2017-06-30 ENCOUNTER — Telehealth: Payer: Self-pay | Admitting: Endocrinology

## 2017-06-30 DIAGNOSIS — E0590 Thyrotoxicosis, unspecified without thyrotoxic crisis or storm: Secondary | ICD-10-CM

## 2017-06-30 NOTE — Telephone Encounter (Signed)
Patient would like to discuss taking a radioactive pill for her thyroid. Call patient to advise, okay to leave a detailed message on phone.

## 2017-07-01 ENCOUNTER — Other Ambulatory Visit: Payer: Self-pay | Admitting: Family Medicine

## 2017-07-01 DIAGNOSIS — E0590 Thyrotoxicosis, unspecified without thyrotoxic crisis or storm: Secondary | ICD-10-CM | POA: Insufficient documentation

## 2017-07-01 NOTE — Telephone Encounter (Signed)
That is the treatment I would advise.  Please see the AVS I gave you for specifics.  Please let us know if you want for me to order.

## 2017-07-01 NOTE — Telephone Encounter (Signed)
Routing to you °

## 2017-07-01 NOTE — Telephone Encounter (Signed)
I spoke with patient & read to her instructions off last visits AVS. She does want to proceed & have test ordered.

## 2017-07-01 NOTE — Telephone Encounter (Signed)
OK.  you will receive a phone call, about a day and time for an appointment. Please come back for a follow-up appointment in 6 weeks

## 2017-07-04 NOTE — Telephone Encounter (Signed)
Called and informed patient she should get a phone call for appt. Then made a f/u appt. For 9/25.

## 2017-07-08 ENCOUNTER — Ambulatory Visit (INDEPENDENT_AMBULATORY_CARE_PROVIDER_SITE_OTHER): Payer: Medicare HMO | Admitting: Family Medicine

## 2017-07-08 ENCOUNTER — Encounter: Payer: Self-pay | Admitting: Family Medicine

## 2017-07-08 VITALS — BP 142/100 | HR 78 | Temp 98.4°F | Resp 18 | Ht 66.0 in | Wt 258.0 lb

## 2017-07-08 DIAGNOSIS — I10 Essential (primary) hypertension: Secondary | ICD-10-CM | POA: Diagnosis not present

## 2017-07-08 DIAGNOSIS — E119 Type 2 diabetes mellitus without complications: Secondary | ICD-10-CM | POA: Diagnosis not present

## 2017-07-08 DIAGNOSIS — D485 Neoplasm of uncertain behavior of skin: Secondary | ICD-10-CM | POA: Diagnosis not present

## 2017-07-08 DIAGNOSIS — R252 Cramp and spasm: Secondary | ICD-10-CM

## 2017-07-08 LAB — CBC WITH DIFFERENTIAL/PLATELET
BASOS PCT: 0 %
Basophils Absolute: 0 cells/uL (ref 0–200)
Eosinophils Absolute: 240 cells/uL (ref 15–500)
Eosinophils Relative: 3 %
HEMATOCRIT: 42.1 % (ref 35.0–45.0)
HEMOGLOBIN: 13.7 g/dL (ref 12.0–15.0)
LYMPHS ABS: 3040 {cells}/uL (ref 850–3900)
Lymphocytes Relative: 38 %
MCH: 29 pg (ref 27.0–33.0)
MCHC: 32.5 g/dL (ref 32.0–36.0)
MCV: 89.2 fL (ref 80.0–100.0)
MONO ABS: 800 {cells}/uL (ref 200–950)
MPV: 9.2 fL (ref 7.5–12.5)
Monocytes Relative: 10 %
NEUTROS ABS: 3920 {cells}/uL (ref 1500–7800)
NEUTROS PCT: 49 %
Platelets: 302 10*3/uL (ref 140–400)
RBC: 4.72 MIL/uL (ref 3.80–5.10)
RDW: 13.8 % (ref 11.0–15.0)
WBC: 8 10*3/uL (ref 3.8–10.8)

## 2017-07-08 MED ORDER — AMLODIPINE BESYLATE 5 MG PO TABS: 5.0000 mg | ORAL_TABLET | Freq: Every day | ORAL | 3 refills | Status: DC

## 2017-07-08 NOTE — Progress Notes (Signed)
Subjective:    Patient ID: Yolanda Gallegos, female    DOB: May 14, 1961, 56 y.o.   MRN: [id number]  Medication Refill     Patient is here today for follow-up of her diabetes. Unfortunately, the patient's son has been recently treated for lung cancer and has suffered radiation pneumonitis and is currently on a ventilator. Therefore she is under tremendous stress and she's been spending majority of time with him at the hospital. That might explain the slight elevation in her blood pressure. She denies any chest pain or shortness of breath. She does report an atypical lesion on her upper lateral right bicep. It is 6 mm in diameter. It is an erythematous scaly papule. It seems to be changing and growing according to the patient. She is also here to recheck her lab work. She denies any polyuria polydipsia.  She denies any hypoglycemia. She does report worsening cramps in the muscles in her legs. She denies any claudication. Cramps occur when she is at rest or trying to sleep. In the past she's had a history of hypomagnesemia and hypokalemia causing cramps. Past Medical History:  Diagnosis Date  . Allergy   . Colon polyps 2015  . COPD (chronic obstructive pulmonary disease) ([place])   . Diabetes mellitus without complication ([place])   . GERD (gastroesophageal reflux disease)   . Hyperlipidemia   . Hypertension   . Legally blind in left eye, as defined in [place]   . Meningioma ([place])   . Multinodular goiter    subclinical hyperthyroidism  . Smoker   . Subclinical hyperthyroidism    due to multinodular goiter.  Confirmed on uptak escan 2015  . Tubular adenoma 2015   Past Surgical History:  Procedure Laterality Date  . ABDOMINAL HYSTERECTOMY  1999   left oopherectomy  . 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 BREAST BIOPSY Left 2007   benign  . [place]  . INNER EAR SURGERY Right 2001  . LEFT HEART CATH AND CORONARY ANGIOGRAPHY N/A 04/22/2017   Procedure: Left Heart Cath and Coronary Angiography;  Surgeon: Nelva Bush, MD;  Location: [facility];  Service: Cardiovascular;  Laterality: N/A;   Current Outpatient Prescriptions on File Prior to Visit  Medication Sig Dispense Refill  . acetaminophen (TYLENOL) 500 MG tablet Take 1,000 mg by mouth 3 (three) times daily as needed for moderate pain or headache.     Marland Kitchen aspirin EC 81 MG tablet Take 1 tablet (81 mg total) by mouth daily. 90 tablet 3  . atorvastatin (LIPITOR) 40 MG tablet TAKE 1 TABLET AT BEDTIME 90 tablet 3  . Blood Glucose Monitoring Suppl (ACCU-CHEK AVIVA PLUS) w/Device KIT TEST BLOOD SUGAR TWICE DAILY TO THREE TIMES DAILY 1 kit 0  . Dulaglutide (TRULICITY) 4.08 XK/4.8JE SOPN Inject SQ weekly (Patient taking differently: Inject 0.75 mg as directed every Monday. ) 12 pen 4  . famotidine (PEPCID) 20 MG tablet Take 1 tablet (20 mg total) by mouth 2 (two) times daily. 180 tablet 3  . fluticasone (FLONASE) 50 MCG/ACT nasal spray USE 2 SPRAYS IN EACH NOSTRIL EVERY DAY 48 g 3  . hydrocortisone (PROCTOZONE-HC) 2.5 % rectal cream Place 1 application rectally 2 (two) times daily as needed for hemorrhoids or itching.    . losartan (COZAAR) 100 MG tablet TAKE 1 TABLET EVERY DAY 90 tablet 3  . Magnesium Oxide 400 MG CAPS Take 1 capsule (400 mg total) by mouth  daily. 270 each 0  . metoprolol succinate (TOPROL-XL) 50 MG 24 hr tablet Take 1 tablet (50 mg total) by mouth 2 (two) times daily. 180 tablet 3  . potassium chloride SA (K-DUR,KLOR-CON) 20 MEQ tablet Take 1 tablet (20 mEq total) by mouth daily. 180 tablet 3  . terbinafine (LAMISIL) 1 % cream Apply 1 application topically daily as needed (athletes foot).    . triamcinolone ointment (KENALOG) 0.1 % APPLY TWICE A DAY FOR 10 DAYS AS NEEDED FOR RASH (Patient taking differently: apply twice daily as needed for rash) 454 g 1  . ZYRTEC ALLERGY 10 MG tablet Take 10 mg by mouth daily.      No current facility-administered medications on  file prior to visit.    Allergies  Allergen Reactions  . Doxycycline Hives and Shortness Of Breath  . Glyxambi [Empagliflozin-Linagliptin] Itching  . Mobic [Meloxicam]     "feeling of paralysis"  . Sulfa Antibiotics     Per positive allergy test   Social History   Social History  . Marital status: Divorced    Spouse name: N/A  . Number of children: N/A  . Years of education: N/A   Occupational History  . Not on file.   Social History Main Topics  . Smoking status: Former Smoker    Packs/day: 1.50    Years: 30.00    Types: Cigarettes    Quit date: 2013  . Smokeless tobacco: Never Used  . Alcohol use No  . Drug use: No  . Sexual activity: Not on file     Comment: married   Other Topics Concern  . Not on file   Social History Narrative  . No narrative on file   Family History  Problem Relation Age of Onset  . Hypertension Mother   . Cancer Mother        cervical  . Cancer Father        leukemia  . Hyperlipidemia Sister   . Hyperlipidemia Brother   . Heart attack Brother 40  . Stroke Brother   . Leukemia Son   . Colon cancer Neg Hx   . Esophageal cancer Neg Hx   . Rectal cancer Neg Hx   . Stomach cancer Neg Hx   . Thyroid disease Neg Hx       Review of Systems  Cardiovascular: Positive for palpitations.  Neurological: Positive for dizziness and light-headedness.  All other systems reviewed and are negative.      Objective:   Physical Exam  Constitutional: She is oriented to person, place, and time. She appears well-developed and well-nourished. No distress.  HENT:  Head: Normocephalic and atraumatic.  Right Ear: External ear normal.  Left Ear: External ear normal.  Nose: Nose normal.  Mouth/Throat: Oropharynx is clear and moist. No oropharyngeal exudate.  Eyes: Pupils are equal, round, and reactive to light. Conjunctivae are normal.  Neck: Normal range of motion. Neck supple. No JVD present. No tracheal deviation present. No thyromegaly  present.  Cardiovascular: Normal rate and normal heart sounds.  An irregularly irregular rhythm present.  Pulmonary/Chest: Effort normal and breath sounds normal. No stridor. No respiratory distress. She has no wheezes. She has no rales.  Abdominal: Soft. Bowel sounds are normal. She exhibits no distension. There is no tenderness. There is no rebound and no guarding.  Musculoskeletal: Normal range of motion. She exhibits no edema, tenderness or deformity.  Lymphadenopathy:    She has no cervical adenopathy.  Neurological: She is alert and oriented  to person, place, and time. She has normal reflexes. No cranial nerve deficit. She exhibits normal muscle tone. Coordination normal.  Skin: She is not diaphoretic.  Psychiatric: She has a normal mood and affect. Her behavior is normal. Judgment and thought content normal.  Vitals reviewed.         Assessment & Plan:  Diabetes mellitus without complication ([place]) - Plan: CBC with Differential/Platelet, COMPLETE METABOLIC PANEL WITH GFR, Lipid panel, Microalbumin, urine, Hemoglobin A1c  Cramps, muscle, general - Plan: Magnesium  Benign essential HTN - Plan: amLODipine (NORVASC) 5 MG tablet  Neoplasm of uncertain behavior of skin - Plan: Pathology  Patient's blood pressures elevated. I will start the patient on amlodipine 5 mg a day and recheck the patient blood pressure in one month. While she is here, I will check a CBC, CMP, and fasting lipid panel along with her hemoglobin A1c. Goal hemoglobin A1c will be less than 7. Your LDL cholesterol be less than 100. Given her cramps, I will also check a potassium and magnesium level. I encouraged her to drink more water. The lesion on her right lateral biceps is concerning for malignancy. I anesthetized the lesion was 0.1% lidocaine with epinephrine. Using sterile technique, I performed a shave biopsy and sent the lesion to pathology and labeled container. Hemostasis was achieved with Drysol and a  Band-Aid

## 2017-07-09 LAB — HEMOGLOBIN A1C
Hgb A1c MFr Bld: 5.6 % (ref <5.7)
Mean Plasma Glucose: 114 mg/dL

## 2017-07-09 LAB — COMPLETE METABOLIC PANEL WITH GFR
ALBUMIN: 4 g/dL (ref 3.6–5.1)
ALK PHOS: 85 U/L (ref 33–130)
ALT: 22 U/L (ref 6–29)
AST: 15 U/L (ref 10–35)
BUN: 8 mg/dL (ref 7–25)
CALCIUM: 9.6 mg/dL (ref 8.6–10.4)
CHLORIDE: 107 mmol/L (ref 98–110)
CO2: 25 mmol/L (ref 20–32)
Creat: 0.64 mg/dL (ref 0.50–1.05)
GFR, Est African American: >89 mL/min (ref >=60)
Glucose, Bld: 112 mg/dL — ABNORMAL HIGH (ref 70–99)
POTASSIUM: 4.2 mmol/L (ref 3.5–5.3)
SODIUM: 143 mmol/L (ref 135–146)
Total Bilirubin: 0.5 mg/dL (ref 0.2–1.2)
Total Protein: 6.5 g/dL (ref 6.1–8.1)

## 2017-07-09 LAB — MICROALBUMIN, URINE: Microalb, Ur: 0.3 mg/dL

## 2017-07-09 LAB — LIPID PANEL
CHOL/HDL RATIO: 3.3 ratio (ref <5.0)
CHOLESTEROL: 145 mg/dL (ref <200)
HDL: 44 mg/dL — AB (ref >50)
LDL Cholesterol: 75 mg/dL (ref <100)
Triglycerides: 131 mg/dL (ref <150)
VLDL: 26 mg/dL (ref <30)

## 2017-07-09 LAB — MAGNESIUM: Magnesium: 1.8 mg/dL (ref 1.5–2.5)

## 2017-07-14 ENCOUNTER — Encounter (HOSPITAL_COMMUNITY): Payer: Medicare HMO

## 2017-07-14 ENCOUNTER — Ambulatory Visit: Payer: Medicare HMO | Admitting: Internal Medicine

## 2017-07-14 LAB — PATHOLOGY

## 2017-07-19 ENCOUNTER — Ambulatory Visit (INDEPENDENT_AMBULATORY_CARE_PROVIDER_SITE_OTHER): Payer: Medicare HMO | Admitting: Family Medicine

## 2017-07-19 ENCOUNTER — Encounter: Payer: Self-pay | Admitting: Family Medicine

## 2017-07-19 VITALS — BP 130/84 | HR 76 | Temp 98.1°F | Resp 16 | Ht 66.0 in | Wt 258.0 lb

## 2017-07-19 DIAGNOSIS — C44622 Squamous cell carcinoma of skin of right upper limb, including shoulder: Secondary | ICD-10-CM

## 2017-07-19 DIAGNOSIS — C4492 Squamous cell carcinoma of skin, unspecified: Secondary | ICD-10-CM | POA: Diagnosis not present

## 2017-07-19 NOTE — Progress Notes (Signed)
Subjective:    Patient ID: Yolanda Gallegos, female    DOB: 1961-05-04, 56 y.o.   MRN: [id number]  HPI Please see last office visit. 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 Margins were involved. Patient is here today for reexcision. Past Medical History:  Diagnosis Date  . Allergy   . Colon polyps 2015  . COPD (chronic obstructive pulmonary disease) ([place])   . Diabetes mellitus without complication ([place])   . GERD (gastroesophageal reflux disease)   . Hyperlipidemia   . Hypertension   . Legally blind in left eye, as defined in [place]   . Meningioma ([place])   . Multinodular goiter    subclinical hyperthyroidism  . Smoker   . Subclinical hyperthyroidism    due to multinodular goiter.  Confirmed on uptak escan 2015  . Tubular adenoma 2015   Past Surgical History:  Procedure Laterality Date  . ABDOMINAL HYSTERECTOMY  1999   left oopherectomy  . 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 BREAST BIOPSY Left 2007   benign  . [place]  . INNER EAR SURGERY Right 2001  . LEFT HEART CATH AND CORONARY ANGIOGRAPHY N/A 04/22/2017   Procedure: Left Heart Cath and Coronary Angiography;  Surgeon: Nelva Bush, MD;  Location: [facility];  Service: Cardiovascular;  Laterality: N/A;   Current Outpatient Prescriptions on File Prior to Visit  Medication Sig Dispense Refill  . acetaminophen (TYLENOL) 500 MG tablet Take 1,000 mg by mouth 3 (three) times daily as needed for moderate pain or headache.     Marland Kitchen amLODipine (NORVASC) 5 MG tablet Take 1 tablet (5 mg total) by mouth daily. 90 tablet 3  . aspirin EC 81 MG tablet Take 1 tablet (81 mg total) by mouth daily. 90 tablet 3  . atorvastatin (LIPITOR) 40 MG tablet TAKE 1 TABLET AT BEDTIME 90 tablet 3  . Blood Glucose Monitoring Suppl (ACCU-CHEK AVIVA PLUS) w/Device KIT TEST BLOOD SUGAR TWICE DAILY TO THREE TIMES DAILY 1 kit 0  . Dulaglutide (TRULICITY) 0.93  GH/8.2XH SOPN Inject SQ weekly (Patient taking differently: Inject 0.75 mg as directed every Monday. ) 12 pen 4  . famotidine (PEPCID) 20 MG tablet Take 1 tablet (20 mg total) by mouth 2 (two) times daily. 180 tablet 3  . fluticasone (FLONASE) 50 MCG/ACT nasal spray USE 2 SPRAYS IN EACH NOSTRIL EVERY DAY 48 g 3  . hydrocortisone (PROCTOZONE-HC) 2.5 % rectal cream Place 1 application rectally 2 (two) times daily as needed for hemorrhoids or itching.    . losartan (COZAAR) 100 MG tablet TAKE 1 TABLET EVERY DAY 90 tablet 3  . Magnesium Oxide 400 MG CAPS Take 1 capsule (400 mg total) by mouth daily. 270 each 0  . metoprolol succinate (TOPROL-XL) 50 MG 24 hr tablet Take 1 tablet (50 mg total) by mouth 2 (two) times daily. 180 tablet 3  . potassium chloride SA (K-DUR,KLOR-CON) 20 MEQ tablet Take 1 tablet (20 mEq total) by mouth daily. 180 tablet 3  . terbinafine (LAMISIL) 1 % cream Apply 1 application topically daily as needed (athletes foot).    . triamcinolone ointment (KENALOG) 0.1 % APPLY TWICE A DAY FOR 10 DAYS AS NEEDED FOR RASH (Patient taking differently: apply twice daily as needed for rash) 454 g 1  . ZYRTEC ALLERGY 10 MG tablet Take 10 mg by mouth daily.      No current facility-administered medications  on file prior to visit.    Allergies  Allergen Reactions  . Doxycycline Hives and Shortness Of Breath  . Glyxambi [Empagliflozin-Linagliptin] Itching  . Mobic [Meloxicam]     "feeling of paralysis"  . Sulfa Antibiotics     Per positive allergy test   Social History   Social History  . Marital status: Divorced    Spouse name: N/A  . Number of children: N/A  . Years of education: N/A   Occupational History  . Not on file.   Social History Main Topics  . Smoking status: Former Smoker    Packs/day: 1.50    Years: 30.00    Types: Cigarettes    Quit date: 2013  . Smokeless tobacco: Never Used  . Alcohol use No  . Drug use: No  . Sexual activity: Not on file     Comment:  married   Other Topics Concern  . Not on file   Social History Narrative  . No narrative on file      Review of Systems  All other systems reviewed and are negative.      Objective:   Physical Exam  Cardiovascular: Normal rate and normal heart sounds.   Pulmonary/Chest: Effort normal and breath sounds normal.  Vitals reviewed.  6 mm scab on the lateral right deltoid/lateral right bicep area. There is some surrounding erythema and inflammation from the previous biopsy site. Entire complex is 1 cm in diameter.       Assessment & Plan:  Squamous cell carcinoma of arm, right  Area was anesthetized 0.1% lidocaine with epinephrine. Patient was prepped and draped in sterile fashion. A 2 cm x 2.5 cm elliptical excision was made of the entire complex ensuring clear margins down to the subcutaneous fat. Wound edges were then approximated using 5 simple interrupted 3-0 Ethilon sutures. The lesion was sent to pathology and labeled container

## 2017-07-21 ENCOUNTER — Encounter (HOSPITAL_COMMUNITY)
Admission: RE | Admit: 2017-07-21 | Discharge: 2017-07-21 | Disposition: A | Discharge status: Home / Self Care | Payer: Medicare HMO | Source: Ambulatory Visit | Attending: Endocrinology | Admitting: Endocrinology

## 2017-07-21 DIAGNOSIS — E0590 Thyrotoxicosis, unspecified without thyrotoxic crisis or storm: Secondary | ICD-10-CM | POA: Diagnosis not present

## 2017-07-21 DIAGNOSIS — E042 Nontoxic multinodular goiter: Secondary | ICD-10-CM | POA: Diagnosis not present

## 2017-07-21 LAB — HCG, SERUM, QUALITATIVE: Preg, Serum: NEGATIVE

## 2017-07-21 LAB — PATHOLOGY

## 2017-07-21 MED ORDER — SODIUM IODIDE I 131 CAPSULE
29.7000 | Freq: Once | INTRAVENOUS | Status: AC | PRN
  Administered 2017-07-21: 29.7 via ORAL

## 2017-07-22 ENCOUNTER — Encounter: Payer: Self-pay | Admitting: *Deleted

## 2017-07-26 ENCOUNTER — Ambulatory Visit (INDEPENDENT_AMBULATORY_CARE_PROVIDER_SITE_OTHER): Payer: Medicare HMO | Admitting: Family Medicine

## 2017-07-26 ENCOUNTER — Encounter (HOSPITAL_COMMUNITY): Payer: Medicare HMO

## 2017-07-26 ENCOUNTER — Encounter: Payer: Self-pay | Admitting: Family Medicine

## 2017-07-26 VITALS — BP 128/82 | HR 82 | Temp 98.0°F | Resp 16 | Ht 66.0 in | Wt 258.0 lb

## 2017-07-26 DIAGNOSIS — C44622 Squamous cell carcinoma of skin of right upper limb, including shoulder: Secondary | ICD-10-CM

## 2017-07-26 DIAGNOSIS — Z23 Encounter for immunization: Secondary | ICD-10-CM

## 2017-07-26 DIAGNOSIS — Z4802 Encounter for removal of sutures: Secondary | ICD-10-CM

## 2017-07-26 NOTE — Progress Notes (Signed)
Subjective:    Patient ID: Yolanda Gallegos, female    DOB: 1960-12-02, 56 y.o.   MRN: [id number]  HPI  07/19/17 Please see last office visit. 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 Margins were involved. Patient is here today for reexcision.  At that time, my plan was: Area was anesthetized 0.1% lidocaine with epinephrine. Patient was prepped and draped in sterile fashion. A 2 cm x 2.5 cm elliptical excision was made of the entire complex ensuring clear margins down to the subcutaneous fat. Wound edges were then approximated using 5 simple interrupted 3-0 Ethilon sutures. The lesion was sent to pathology and labeled container  07/26/17 She is here today for suture removal. The surgical site is well-healed with no evidence of erythema or infection. 5 sutures were removed easily without complication. Repeat biopsy reveals clear margins with no evidence of residual malignancy left behind. Past Medical History:  Diagnosis Date  . Allergy   . Colon polyps 2015  . COPD (chronic obstructive pulmonary disease) ([place])   . Diabetes mellitus without complication ([place])   . GERD (gastroesophageal reflux disease)   . Hyperlipidemia   . Hypertension   . Legally blind in left eye, as defined in [place]   . Meningioma ([place])   . Multinodular goiter    subclinical hyperthyroidism  . Smoker   . Subclinical hyperthyroidism    due to multinodular goiter.  Confirmed on uptak escan 2015  . Tubular adenoma 2015   Past Surgical History:  Procedure Laterality Date  . ABDOMINAL HYSTERECTOMY  1999   left oopherectomy  . 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 BREAST BIOPSY Left 2007   benign  . [place]  . INNER EAR SURGERY Right 2001  . LEFT HEART CATH AND CORONARY ANGIOGRAPHY N/A 04/22/2017   Procedure: Left Heart Cath and Coronary Angiography;  Surgeon: Nelva Bush, MD;  Location: [facility];  Service:  Cardiovascular;  Laterality: N/A;   Current Outpatient Prescriptions on File Prior to Visit  Medication Sig Dispense Refill  . acetaminophen (TYLENOL) 500 MG tablet Take 1,000 mg by mouth 3 (three) times daily as needed for moderate pain or headache.     Marland Kitchen amLODipine (NORVASC) 5 MG tablet Take 1 tablet (5 mg total) by mouth daily. 90 tablet 3  . aspirin EC 81 MG tablet Take 1 tablet (81 mg total) by mouth daily. 90 tablet 3  . atorvastatin (LIPITOR) 40 MG tablet TAKE 1 TABLET AT BEDTIME 90 tablet 3  . Blood Glucose Monitoring Suppl (ACCU-CHEK AVIVA PLUS) w/Device KIT TEST BLOOD SUGAR TWICE DAILY TO THREE TIMES DAILY 1 kit 0  . Dulaglutide (TRULICITY) 4.49 QP/5.9FM SOPN Inject SQ weekly (Patient taking differently: Inject 0.75 mg as directed every Monday. ) 12 pen 4  . famotidine (PEPCID) 20 MG tablet Take 1 tablet (20 mg total) by mouth 2 (two) times daily. 180 tablet 3  . fluticasone (FLONASE) 50 MCG/ACT nasal spray USE 2 SPRAYS IN EACH NOSTRIL EVERY DAY 48 g 3  . hydrocortisone (PROCTOZONE-HC) 2.5 % rectal cream Place 1 application rectally 2 (two) times daily as needed for hemorrhoids or itching.    . losartan (COZAAR) 100 MG tablet TAKE 1 TABLET EVERY DAY 90 tablet 3  . Magnesium Oxide 400 MG CAPS Take 1 capsule (400 mg total) by mouth daily. 270 each 0  . metoprolol succinate (TOPROL-XL) 50 MG 24 hr  tablet Take 1 tablet (50 mg total) by mouth 2 (two) times daily. 180 tablet 3  . potassium chloride SA (K-DUR,KLOR-CON) 20 MEQ tablet Take 1 tablet (20 mEq total) by mouth daily. 180 tablet 3  . terbinafine (LAMISIL) 1 % cream Apply 1 application topically daily as needed (athletes foot).    . triamcinolone ointment (KENALOG) 0.1 % APPLY TWICE A DAY FOR 10 DAYS AS NEEDED FOR RASH (Patient taking differently: apply twice daily as needed for rash) 454 g 1  . ZYRTEC ALLERGY 10 MG tablet Take 10 mg by mouth daily.      No current facility-administered medications on file prior to visit.     Allergies  Allergen Reactions  . Doxycycline Hives and Shortness Of Breath  . Glyxambi [Empagliflozin-Linagliptin] Itching  . Mobic [Meloxicam]     "feeling of paralysis"  . Sulfa Antibiotics     Per positive allergy test   Social History   Social History  . Marital status: Divorced    Spouse name: N/A  . Number of children: N/A  . Years of education: N/A   Occupational History  . Not on file.   Social History Main Topics  . Smoking status: Former Smoker    Packs/day: 1.50    Years: 30.00    Types: Cigarettes    Quit date: 2013  . Smokeless tobacco: Never Used  . Alcohol use No  . Drug use: No  . Sexual activity: Not on file     Comment: married   Other Topics Concern  . Not on file   Social History Narrative  . No narrative on file      Review of Systems  All other systems reviewed and are negative.      Objective:   Physical Exam  Cardiovascular: Normal rate and normal heart sounds.   Pulmonary/Chest: Effort normal and breath sounds normal.  Vitals reviewed.  4 cm linear surgical scar, healing well with no evidence of cellulitis      Assessment & Plan:  Squamous cell carcinoma of arm, right  Need for immunization against influenza - Plan: Flu Vaccine QUAD 36+ mos IM  Visit for suture removal Sutures removed without difficulty. Biopsy shows clear margins. Patient received her flu shot while here today

## 2017-08-12 ENCOUNTER — Ambulatory Visit: Payer: Medicare HMO | Admitting: Internal Medicine

## 2017-08-16 ENCOUNTER — Ambulatory Visit: Payer: Medicare HMO | Admitting: Endocrinology

## 2017-08-26 ENCOUNTER — Ambulatory Visit (INDEPENDENT_AMBULATORY_CARE_PROVIDER_SITE_OTHER): Payer: Medicare HMO | Admitting: Internal Medicine

## 2017-08-26 ENCOUNTER — Encounter: Payer: Self-pay | Admitting: Internal Medicine

## 2017-08-26 VITALS — BP 124/72 | HR 82 | Ht 66.0 in | Wt 262.0 lb

## 2017-08-26 DIAGNOSIS — I472 Ventricular tachycardia: Secondary | ICD-10-CM | POA: Diagnosis not present

## 2017-08-26 DIAGNOSIS — R002 Palpitations: Secondary | ICD-10-CM

## 2017-08-26 DIAGNOSIS — I471 Supraventricular tachycardia: Secondary | ICD-10-CM

## 2017-08-26 DIAGNOSIS — I10 Essential (primary) hypertension: Secondary | ICD-10-CM

## 2017-08-26 DIAGNOSIS — I4729 Other ventricular tachycardia: Secondary | ICD-10-CM

## 2017-08-26 NOTE — Progress Notes (Signed)
Follow-up Outpatient Visit Date: 08/26/2017  Primary Care Provider: [name], MD [address] [place] [address]  Chief Complaint: Follow-up palpitations and chest pain  HPI:  Yolanda Gallegos is a 56 y.o. year-old female with history of diabetes mellitus, hypertension, hyperlipidemia, subclinical hyperthyroidism, and GERD, who presents for follow-up of palpitations and chest pain. I last saw Yolanda Gallegos in May, at which time her chest pain had resolved. She continued to have some palpitations. Given findings of nonsustained ventricular tachycardia on prior monitor, we agreed to proceed with left heart catheterization in June. This showed no significant CAD.  Today, Yolanda Gallegos reports feeling well. She has finally undergone radioactive iodine treatment of her thyroid disease. She has noted a couple of very brief palpitations but overall feels much better compared with last spring. She denies chest pain and shortness of breath. She has noted mild leg edema since being started on amlodipine for blood pressure control by Dr. Dennard Schaumann. She denies orthopnea, PND, claudication, and lightheadedness. Her son is currently at an LTAC after a lengthy hospitalization for respiratory failure at [facility]. She is hopeful that he will be able to transition back home soon but will need extensive care (including home ventilator)  --------------------------------------------------------------------------------------------------  Cardiovascular Problems:  Palpitations with NSVT  Atypical chest pain  Risk Factors:  Diabetes mellitus, hypertension, hyperlipidemia, obesity, and history of tobacco use  Cath/PCI:  LHC (04/22/17): No angiographically significant coronary artery disease. LVEF 50-55% with upper normal to mildly elevated filling pressure.  CV Surgery:  None  EP Procedures and Devices:  14-day event monitor (02/17/17): Predominantly sinus rhythm with rare PSVT and NSVT, as well as rare  PACs and PVCs.  Non-Invasive Evaluation(s):  None available; patient reports negative stress test several years ago  Recent CV Pertinent Labs: Lab Results  Component Value Date   CHOL 145 07/08/2017   HDL 44 (L) 07/08/2017   LDLCALC 75 07/08/2017   TRIG 131 07/08/2017   CHOLHDL 3.3 07/08/2017   INR 1.0 04/14/2017   K 4.2 07/08/2017   MG 1.8 07/08/2017   BUN 8 07/08/2017   BUN 12 04/14/2017   CREATININE 0.64 07/08/2017    Past medical and surgical history were reviewed and updated in EPIC.  Current Meds  Medication Sig  . acetaminophen (TYLENOL) 500 MG tablet Take 1,000 mg by mouth 3 (three) times daily as needed for moderate pain or headache.   Marland Kitchen amLODipine (NORVASC) 5 MG tablet Take 1 tablet (5 mg total) by mouth daily.  Marland Kitchen aspirin EC 81 MG tablet Take 1 tablet (81 mg total) by mouth daily.  Marland Kitchen atorvastatin (LIPITOR) 40 MG tablet TAKE 1 TABLET AT BEDTIME  . Blood Glucose Monitoring Suppl (ACCU-CHEK AVIVA PLUS) w/Device KIT TEST BLOOD SUGAR TWICE DAILY TO THREE TIMES DAILY  . Dulaglutide (TRULICITY) 3.57 SV/7.7LT SOPN Inject SQ weekly (Patient taking differently: Inject 0.75 mg as directed every Monday. )  . famotidine (PEPCID) 20 MG tablet Take 1 tablet (20 mg total) by mouth 2 (two) times daily.  . fluticasone (FLONASE) 50 MCG/ACT nasal spray USE 2 SPRAYS IN EACH NOSTRIL EVERY DAY  . hydrocortisone (PROCTOZONE-HC) 2.5 % rectal cream Place 1 application rectally 2 (two) times daily as needed for hemorrhoids or itching.  . losartan (COZAAR) 100 MG tablet TAKE 1 TABLET EVERY DAY  . Magnesium Oxide 400 MG CAPS Take 1 capsule (400 mg total) by mouth daily.  . metoprolol succinate (TOPROL-XL) 50 MG 24 hr tablet Take 1 tablet (  50 mg total) by mouth 2 (two) times daily.  . potassium chloride SA (K-DUR,KLOR-CON) 20 MEQ tablet Take 1 tablet (20 mEq total) by mouth daily.  Marland Kitchen terbinafine (LAMISIL) 1 % cream Apply 1 application topically daily as needed (athletes foot).  . triamcinolone  ointment (KENALOG) 0.1 % APPLY TWICE A DAY FOR 10 DAYS AS NEEDED FOR RASH (Patient taking differently: apply twice daily as needed for rash)  . ZYRTEC ALLERGY 10 MG tablet Take 10 mg by mouth daily.     Allergies: Doxycycline; Glyxambi [empagliflozin-linagliptin]; Mobic [meloxicam]; and Sulfa antibiotics  Social History   Social History  . Marital status: Divorced    Spouse name: N/A  . Number of children: N/A  . Years of education: N/A   Occupational History  . Not on file.   Social History Main Topics  . Smoking status: Former Smoker    Packs/day: 1.50    Years: 30.00    Types: Cigarettes    Quit date: 2013  . Smokeless tobacco: Never Used  . Alcohol use No  . Drug use: No  . Sexual activity: Not on file     Comment: married   Other Topics Concern  . Not on file   Social History Narrative  . No narrative on file    Family History  Problem Relation Age of Onset  . Hypertension Mother   . Cancer Mother        cervical  . Cancer Father        leukemia  . Hyperlipidemia Sister   . Hyperlipidemia Brother   . Heart attack Brother 27  . Stroke Brother   . Leukemia Son   . Colon cancer Neg Hx   . Esophageal cancer Neg Hx   . Rectal cancer Neg Hx   . Stomach cancer Neg Hx   . Thyroid disease Neg Hx     Review of Systems: A 12-system review of systems was performed and was negative except as noted in the HPI.  --------------------------------------------------------------------------------------------------  Physical Exam: BP 124/72   Pulse 82   Ht '5\' 6"'  (1.676 m)   Wt 262 lb (118.8 kg)   SpO2 95%   BMI 42.29 kg/m   General:  Morbidly obese woman, seated comfortably in the exam room. HEENT: No conjunctival pallor or scleral icterus. Moist mucous membranes.  OP clear. Neck: Supple without lymphadenopathy. Mild diffuse thyromegaly noted. No significant JVD. Lungs: Normal work of breathing. Clear to auscultation bilaterally without wheezes or  crackles. Heart: Regular rate and rhythm without murmurs, rubs, or gallops. Unable to assess PMI due to body habitus.  Abd: Bowel sounds present. Soft, NT/ND. Unable to assess HSM due to body habitus. Ext: Trace ankle edema bilaterally. Radial, PT, and DP pulses are 2+ bilaterally. Right radial arteriotomy is well-healed. Skin: Warm and dry without rash.  Lab Results  Component Value Date   WBC 8.0 07/08/2017   HGB 13.7 07/08/2017   HCT 42.1 07/08/2017   MCV 89.2 07/08/2017   PLT 302 07/08/2017    Lab Results  Component Value Date   NA 143 07/08/2017   K 4.2 07/08/2017   CL 107 07/08/2017   CO2 25 07/08/2017   BUN 8 07/08/2017   CREATININE 0.64 07/08/2017   GLUCOSE 112 (H) 07/08/2017   ALT 22 07/08/2017    Lab Results  Component Value Date   CHOL 145 07/08/2017   HDL 44 (L) 07/08/2017   LDLCALC 75 07/08/2017   TRIG 131 07/08/2017   CHOLHDL  3.3 07/08/2017   --------------------------------------------------------------------------------------------------  ASSESSMENT AND PLAN: Palpitations, paroxysmal supraventricular tachycardia, and nonsustained ventricular tachycardia Symptoms are much improved. I suspect that marked hypomagnesemia and hyperthyroidism were the culprits. I encouraged Yolanda Gallegos to continue with her magnesium supplement and current dose of metoprolol. She has completed radioactive iodine therapy for her hyperthyroidism. No further workup at this time.  Hypertension Blood pressure is well controlled. Yolanda Gallegos should continue her current medications and follow-up with Dr. Dennard Schaumann.  Follow-up: Return to clinic in 1 year.  Nelva Bush, MD 08/26/2017 4:06 PM

## 2017-08-26 NOTE — Patient Instructions (Signed)
Medication Instructions:  Your physician recommends that you continue on your current medications as directed. Please refer to the Current Medication list given to you today.   Labwork: None  Testing/Procedures: None   Follow-Up: Your physician wants you to follow-up in: 1 year with Dr End. (October 2019).  You will receive a reminder letter in the mail two months in advance. If you don't receive a letter, please call our office to schedule the follow-up appointment.        If you need a refill on your cardiac medications before your next appointment, please call your pharmacy.   

## 2017-08-27 DIAGNOSIS — R002 Palpitations: Secondary | ICD-10-CM | POA: Insufficient documentation

## 2017-08-27 DIAGNOSIS — I471 Supraventricular tachycardia: Secondary | ICD-10-CM | POA: Insufficient documentation

## 2017-09-06 ENCOUNTER — Ambulatory Visit: Payer: Medicare HMO | Admitting: Endocrinology

## 2017-09-14 ENCOUNTER — Ambulatory Visit: Payer: Medicare HMO | Admitting: Internal Medicine

## 2017-09-27 ENCOUNTER — Ambulatory Visit (INDEPENDENT_AMBULATORY_CARE_PROVIDER_SITE_OTHER): Payer: Medicare HMO | Admitting: Endocrinology

## 2017-09-27 ENCOUNTER — Encounter: Payer: Self-pay | Admitting: Endocrinology

## 2017-09-27 VITALS — BP 124/76 | HR 94 | Wt 267.2 lb

## 2017-09-27 DIAGNOSIS — H3589 Other specified retinal disorders: Secondary | ICD-10-CM | POA: Diagnosis not present

## 2017-09-27 DIAGNOSIS — Z9889 Other specified postprocedural states: Secondary | ICD-10-CM | POA: Diagnosis not present

## 2017-09-27 DIAGNOSIS — E0590 Thyrotoxicosis, unspecified without thyrotoxic crisis or storm: Secondary | ICD-10-CM | POA: Diagnosis not present

## 2017-09-27 DIAGNOSIS — H5213 Myopia, bilateral: Secondary | ICD-10-CM | POA: Diagnosis not present

## 2017-09-27 DIAGNOSIS — Z86018 Personal history of other benign neoplasm: Secondary | ICD-10-CM | POA: Diagnosis not present

## 2017-09-27 DIAGNOSIS — H5010 Unspecified exotropia: Secondary | ICD-10-CM | POA: Diagnosis not present

## 2017-09-27 DIAGNOSIS — H02402 Unspecified ptosis of left eyelid: Secondary | ICD-10-CM | POA: Diagnosis not present

## 2017-09-27 DIAGNOSIS — H468 Other optic neuritis: Secondary | ICD-10-CM | POA: Diagnosis not present

## 2017-09-27 DIAGNOSIS — H5347 Heteronymous bilateral field defects: Secondary | ICD-10-CM | POA: Diagnosis not present

## 2017-09-27 LAB — T4, FREE: Free T4: 0.49 ng/dL — ABNORMAL LOW (ref 0.60–1.60)

## 2017-09-27 LAB — TSH: TSH: 5.6 u[IU]/mL — AB (ref 0.35–4.50)

## 2017-09-27 MED ORDER — LEVOTHYROXINE SODIUM 100 MCG PO TABS: 100.0000 ug | ORAL_TABLET | Freq: Every day | ORAL | 2 refills | Status: DC

## 2017-09-27 NOTE — Progress Notes (Signed)
Subjective:    Patient ID: Yolanda Gallegos, female    DOB: June 25, 1961, 56 y.o.   MRN: [id number]  HPI Pt returns for f/u of hyperthyroidism (dx'ed 2014; nuc med scan showed subtle warm nodule at inferior pole LEFT thyroid lobe with interval decrease in prominence of a subtle cold nodule at inferior pole of the RIGHT thyroid lobe since the previous study; she had RAI in Aug of 2018).  Since then, she feels no different, and well in general.   Past Medical History:  Diagnosis Date  . Allergy   . Colon polyps 2015  . COPD (chronic obstructive pulmonary disease) ([place])   . Diabetes mellitus without complication ([place])   . GERD (gastroesophageal reflux disease)   . Hyperlipidemia   . Hypertension   . Legally blind in left eye, as defined in [place]   . Meningioma ([place])   . Multinodular goiter    subclinical hyperthyroidism  . Smoker   . Subclinical hyperthyroidism    due to multinodular goiter.  Confirmed on uptak escan 2015  . Tubular adenoma 2015    Past Surgical History:  Procedure Laterality Date  . ABDOMINAL HYSTERECTOMY  1999   left oopherectomy  . 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 BREAST BIOPSY Left 2007   benign  . [place]  . INNER EAR SURGERY Right 2001    Social History   Socioeconomic History  . Marital status: Divorced    Spouse name: Not on file  . Number of children: Not on file  . Years of education: Not on file  . Highest education level: Not on file  Social Needs  . Financial resource strain: Not on file  . Food insecurity - worry: Not on file  . Food insecurity - inability: Not on file  . Transportation needs - medical: Not on file  . Transportation needs - non-medical: Not on file  Occupational History  . Not on file  Tobacco Use  . Smoking status: Former Smoker    Packs/day: 1.50    Years: 30.00    Pack years: 45.00    Types: Cigarettes    Last attempt to quit: 2013    Years since  quitting: 5.8  . Smokeless tobacco: Never Used  Substance and Sexual Activity  . Alcohol use: No  . Drug use: No  . Sexual activity: Not on file    Comment: married  Other Topics Concern  . Not on file  Social History Narrative  . Not on file    Current Outpatient Medications on File Prior to Visit  Medication Sig Dispense Refill  . acetaminophen (TYLENOL) 500 MG tablet Take 1,000 mg by mouth 3 (three) times daily as needed for moderate pain or headache.     Marland Kitchen amLODipine (NORVASC) 5 MG tablet Take 1 tablet (5 mg total) by mouth daily. 90 tablet 3  . aspirin EC 81 MG tablet Take 1 tablet (81 mg total) by mouth daily. 90 tablet 3  . atorvastatin (LIPITOR) 40 MG tablet TAKE 1 TABLET AT BEDTIME 90 tablet 3  . Blood Glucose Monitoring Suppl (ACCU-CHEK AVIVA PLUS) w/Device KIT TEST BLOOD SUGAR TWICE DAILY TO THREE TIMES DAILY 1 kit 0  . Dulaglutide (TRULICITY) 0.08 QP/6.1PJ SOPN Inject SQ weekly (Patient taking differently: Inject 0.75 mg as directed every Monday. ) 12 pen 4  . famotidine (PEPCID) 20 MG tablet Take 1 tablet (20 mg total) by mouth  2 (two) times daily. 180 tablet 3  . fluticasone (FLONASE) 50 MCG/ACT nasal spray USE 2 SPRAYS IN EACH NOSTRIL EVERY DAY 48 g 3  . hydrocortisone (PROCTOZONE-HC) 2.5 % rectal cream Place 1 application rectally 2 (two) times daily as needed for hemorrhoids or itching.    . losartan (COZAAR) 100 MG tablet TAKE 1 TABLET EVERY DAY 90 tablet 3  . Magnesium Oxide 400 MG CAPS Take 1 capsule (400 mg total) by mouth daily. 270 each 0  . metoprolol succinate (TOPROL-XL) 50 MG 24 hr tablet Take 1 tablet (50 mg total) by mouth 2 (two) times daily. 180 tablet 3  . potassium chloride SA (K-DUR,KLOR-CON) 20 MEQ tablet Take 1 tablet (20 mEq total) by mouth daily. 180 tablet 3  . terbinafine (LAMISIL) 1 % cream Apply 1 application topically daily as needed (athletes foot).    . triamcinolone ointment (KENALOG) 0.1 % APPLY TWICE A DAY FOR 10 DAYS AS NEEDED FOR RASH  (Patient taking differently: apply twice daily as needed for rash) 454 g 1  . ZYRTEC ALLERGY 10 MG tablet Take 10 mg by mouth daily.      No current facility-administered medications on file prior to visit.     Allergies  Allergen Reactions  . Doxycycline Hives and Shortness Of Breath  . Glyxambi [Empagliflozin-Linagliptin] Itching  . Mobic [Meloxicam]     "feeling of paralysis"  . Sulfa Antibiotics     Per positive allergy test    Family History  Problem Relation Age of Onset  . Hypertension Mother   . Cancer Mother        cervical  . Cancer Father        leukemia  . Hyperlipidemia Sister   . Hyperlipidemia Brother   . Heart attack Brother 71  . Stroke Brother   . Leukemia Son   . Colon cancer Neg Hx   . Esophageal cancer Neg Hx   . Rectal cancer Neg Hx   . Stomach cancer Neg Hx   . Thyroid disease Neg Hx     BP 124/76 (BP Location: Left Arm, Patient Position: Sitting, Cuff Size: Normal)   Pulse 94   Wt 267 lb 3.2 oz (121.2 kg)   SpO2 94%   BMI 43.13 kg/m    Review of Systems Denies tremor.      Objective:   Physical Exam VITAL SIGNS:  See vs page GENERAL: no distress NECK: There is no palpable thyroid enlargement.  No thyroid nodule is palpable.  No palpable lymphadenopathy at the anterior neck.       Assessment & Plan:  Hyperthyroidism, due for recheck.  She requests to f/u with Dr Dennard Schaumann, rather than here, to save money--fine with me.  Patient Instructions  blood tests are requested for you today.  We'll let you know about the results. Please see Dr Dennard Schaumann in 1 month.   You will need to have the thyroid blood tests checked approx once per month for the next 3 months, every few months approx twice, then 1-2 times per year after that.   I would be happy to see you back here as needed.

## 2017-09-27 NOTE — Patient Instructions (Addendum)
blood tests are requested for you today.  We'll let you know about the results. Please see Dr Dennard Schaumann in 1 month.   You will need to have the thyroid blood tests checked approx once per month for the next 3 months, every few months approx twice, then 1-2 times per year after that.   I would be happy to see you back here as needed.

## 2017-10-18 ENCOUNTER — Encounter: Payer: Self-pay | Admitting: Family Medicine

## 2017-11-02 ENCOUNTER — Other Ambulatory Visit: Payer: Self-pay | Admitting: Family Medicine

## 2017-11-02 ENCOUNTER — Other Ambulatory Visit: Payer: Self-pay

## 2017-11-02 DIAGNOSIS — E0590 Thyrotoxicosis, unspecified without thyrotoxic crisis or storm: Secondary | ICD-10-CM

## 2017-11-02 MED ORDER — LEVOTHYROXINE SODIUM 100 MCG PO TABS: 100.0000 ug | ORAL_TABLET | Freq: Every day | ORAL | 2 refills | Status: DC

## 2017-11-04 ENCOUNTER — Other Ambulatory Visit: Payer: Medicare HMO

## 2017-11-04 DIAGNOSIS — E0590 Thyrotoxicosis, unspecified without thyrotoxic crisis or storm: Secondary | ICD-10-CM

## 2017-11-04 LAB — TSH: TSH: 2.2 mIU/L

## 2017-11-06 ENCOUNTER — Other Ambulatory Visit: Payer: Self-pay | Admitting: Endocrinology

## 2018-01-12 ENCOUNTER — Other Ambulatory Visit: Payer: Self-pay | Admitting: Internal Medicine

## 2018-01-13 NOTE — Telephone Encounter (Signed)
Please review for refill, thanks ! 

## 2018-02-25 DIAGNOSIS — H6691 Otitis media, unspecified, right ear: Secondary | ICD-10-CM | POA: Diagnosis not present

## 2018-02-25 DIAGNOSIS — M62838 Other muscle spasm: Secondary | ICD-10-CM | POA: Diagnosis not present

## 2018-03-14 ENCOUNTER — Encounter: Payer: Self-pay | Admitting: Family Medicine

## 2018-03-14 ENCOUNTER — Ambulatory Visit (INDEPENDENT_AMBULATORY_CARE_PROVIDER_SITE_OTHER): Payer: Medicare HMO | Admitting: Family Medicine

## 2018-03-14 ENCOUNTER — Encounter: Payer: Self-pay | Admitting: Internal Medicine

## 2018-03-14 VITALS — BP 134/82 | HR 88 | Temp 98.1°F | Resp 18 | Ht 66.0 in | Wt 272.0 lb

## 2018-03-14 DIAGNOSIS — E118 Type 2 diabetes mellitus with unspecified complications: Secondary | ICD-10-CM

## 2018-03-14 DIAGNOSIS — E039 Hypothyroidism, unspecified: Secondary | ICD-10-CM | POA: Diagnosis not present

## 2018-03-14 DIAGNOSIS — Z0000 Encounter for general adult medical examination without abnormal findings: Secondary | ICD-10-CM

## 2018-03-14 DIAGNOSIS — Z1211 Encounter for screening for malignant neoplasm of colon: Secondary | ICD-10-CM

## 2018-03-14 DIAGNOSIS — Z1239 Encounter for other screening for malignant neoplasm of breast: Secondary | ICD-10-CM

## 2018-03-14 DIAGNOSIS — Z1231 Encounter for screening mammogram for malignant neoplasm of breast: Secondary | ICD-10-CM

## 2018-03-14 LAB — COMPLETE METABOLIC PANEL WITH GFR
AG Ratio: 1.4 (calc) (ref 1.0–2.5)
ALT: 20 U/L (ref 6–29)
AST: 17 U/L (ref 10–35)
Albumin: 4.2 g/dL (ref 3.6–5.1)
Alkaline phosphatase (APISO): 96 U/L (ref 33–130)
BUN: 12 mg/dL (ref 7–25)
CALCIUM: 10 mg/dL (ref 8.6–10.4)
CO2: 31 mmol/L (ref 20–32)
CREATININE: 0.65 mg/dL (ref 0.50–1.05)
Chloride: 106 mmol/L (ref 98–110)
GFR, EST AFRICAN AMERICAN: 117 mL/min/{1.73_m2} (ref >=60)
GFR, EST NON AFRICAN AMERICAN: 101 mL/min/{1.73_m2} (ref >=60)
Globulin: 2.9 g/dL (calc) (ref 1.9–3.7)
Glucose, Bld: 118 mg/dL — ABNORMAL HIGH (ref 65–99)
Potassium: 4 mmol/L (ref 3.5–5.3)
Sodium: 144 mmol/L (ref 135–146)
TOTAL PROTEIN: 7.1 g/dL (ref 6.1–8.1)
Total Bilirubin: 0.6 mg/dL (ref 0.2–1.2)

## 2018-03-14 LAB — CBC WITH DIFFERENTIAL/PLATELET
BASOS PCT: 0.5 %
Basophils Absolute: 42 cells/uL (ref 0–200)
EOS PCT: 3.7 %
Eosinophils Absolute: 311 cells/uL (ref 15–500)
HEMATOCRIT: 42 % (ref 35.0–45.0)
HEMOGLOBIN: 13.9 g/dL (ref 11.7–15.5)
LYMPHS ABS: 2755 {cells}/uL (ref 850–3900)
MCH: 29.2 pg (ref 27.0–33.0)
MCHC: 33.1 g/dL (ref 32.0–36.0)
MCV: 88.2 fL (ref 80.0–100.0)
MPV: 9.9 fL (ref 7.5–12.5)
Monocytes Relative: 7.7 %
NEUTROS ABS: 4645 {cells}/uL (ref 1500–7800)
NEUTROS PCT: 55.3 %
Platelets: 307 10*3/uL (ref 140–400)
RBC: 4.76 10*6/uL (ref 3.80–5.10)
RDW: 12.2 % (ref 11.0–15.0)
Total Lymphocyte: 32.8 %
WBC: 8.4 10*3/uL (ref 3.8–10.8)
WBCMIX: 647 {cells}/uL (ref 200–950)

## 2018-03-14 LAB — TSH: TSH: 1.05 mIU/L

## 2018-03-14 LAB — LIPID PANEL
CHOLESTEROL: 177 mg/dL (ref <200)
HDL: 46 mg/dL — AB (ref >50)
LDL Cholesterol (Calc): 101 mg/dL (calc) — ABNORMAL HIGH
NON-HDL CHOLESTEROL (CALC): 131 mg/dL — AB (ref <130)
Total CHOL/HDL Ratio: 3.8 (calc) (ref <5.0)
Triglycerides: 183 mg/dL — ABNORMAL HIGH (ref <150)

## 2018-03-14 LAB — MAGNESIUM: Magnesium: 1.8 mg/dL (ref 1.5–2.5)

## 2018-03-14 MED ORDER — CEFDINIR 300 MG PO CAPS: 300.0000 mg | ORAL_CAPSULE | Freq: Two times a day (BID) | ORAL | 0 refills | Status: DC

## 2018-03-14 NOTE — Progress Notes (Signed)
Subjective:    Patient ID: Yolanda Gallegos, female    DOB: [birth year], 57 y.o.   MRN: [id number]  HPI Patient is here today for complete physical exam.  Last year her son was in the hospital and therefore she fell behind on her cancer screening.  It has been more than 2 years since her last mammogram.  It is been 3 years since her last colonoscopy and they are checking her colon every 2-3 years due to her history of colon polyps.  She is requesting that I schedule these 2 cancer screening test for her.  She sees an ophthalmologist annually for an diabetic eye exam.  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  Her last eye exam was in July of last year and is scheduled again for July of this year.  She declines an HIV test.  Immunizations are up-to-date.  Diabetic foot exam is performed today and is normal.  She has a history of hypomagnesemia and would like to have her magnesium level checked.  She is also due to recheck her thyroid, her fasting lipid panel, and her hemoglobin A1c.  Her blood pressure today is well controlled at 134/82.  Unfortunately she has gained 14 pounds since I last saw her in September despite being on Trulicity.  She admits to being very sedentary and not watching her diet. Past Medical History:  Diagnosis Date  . Allergy   . Colon polyps 2015  . COPD (chronic obstructive pulmonary disease) ([place])   . Diabetes mellitus without complication ([place])   . GERD (gastroesophageal reflux disease)   . Hyperlipidemia   . Hypertension   . Legally blind in left eye, as defined in [place]   . Meningioma ([place])   . Multinodular goiter    subclinical hyperthyroidism  . Smoker   . Subclinical hyperthyroidism    due to multinodular goiter.  Confirmed on uptak escan 2015  . Tubular adenoma 2015   Past Surgical History:  Procedure Laterality  Date  . ABDOMINAL HYSTERECTOMY  1999   left oopherectomy  . 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 BREAST BIOPSY Left 2007   benign  . [place]  . INNER EAR SURGERY Right 2001  . LEFT HEART CATH AND CORONARY ANGIOGRAPHY N/A 04/22/2017   Procedure: Left Heart Cath and Coronary Angiography;  Surgeon: Nelva Bush, MD;  Location: [facility];  Service: Cardiovascular;  Laterality: N/A;   Current Outpatient Medications on File Prior to Visit  Medication Sig Dispense Refill  . acetaminophen (TYLENOL) 500 MG tablet Take 1,000 mg by mouth 3 (three) times daily as needed for moderate pain or headache.     Marland Kitchen amLODipine (NORVASC) 5 MG tablet Take 1 tablet (5 mg total) by mouth daily. 90 tablet 3  . aspirin EC 81 MG tablet Take 1 tablet (81 mg total) by mouth daily. 90 tablet 3  . atorvastatin (LIPITOR) 40 MG tablet TAKE 1 TABLET AT BEDTIME 90 tablet 3  . Blood Glucose Monitoring Suppl (ACCU-CHEK AVIVA PLUS) w/Device KIT TEST BLOOD SUGAR TWICE DAILY TO THREE TIMES DAILY 1 kit 0  . Dulaglutide (TRULICITY) 4.16 LA/4.5XM SOPN Inject SQ weekly (Patient taking differently: Inject 0.75 mg as directed every Monday. ) 12 pen 4  .  fluticasone (FLONASE) 50 MCG/ACT nasal spray USE 2 SPRAYS IN EACH NOSTRIL EVERY DAY 48 g 3  . hydrocortisone (PROCTOZONE-HC) 2.5 % rectal cream Place 1 application rectally 2 (two) times daily as needed for hemorrhoids or itching.    . levothyroxine (SYNTHROID, LEVOTHROID) 100 MCG tablet TAKE 1 TABLET EVERY DAY BEFORE BREAKFAST 90 tablet 2  . losartan (COZAAR) 100 MG tablet TAKE 1 TABLET EVERY DAY 90 tablet 3  . Magnesium Oxide 400 MG CAPS Take 1 capsule (400 mg total) by mouth daily. 270 each 0  . metoprolol succinate (TOPROL-XL) 50 MG 24 hr tablet Take 1 tablet (50 mg total) by mouth 2 (two) times daily. 180 tablet 1  . omeprazole (PRILOSEC) 20 MG capsule Take 20 mg by mouth daily.    . potassium chloride SA  (K-DUR,KLOR-CON) 20 MEQ tablet Take 1 tablet (20 mEq total) by mouth daily. 180 tablet 3  . terbinafine (LAMISIL) 1 % cream Apply 1 application topically daily as needed (athletes foot).    . triamcinolone ointment (KENALOG) 0.1 % APPLY TWICE A DAY FOR 10 DAYS AS NEEDED FOR RASH (Patient taking differently: apply twice daily as needed for rash) 454 g 1  . ZYRTEC ALLERGY 10 MG tablet Take 10 mg by mouth daily.      No current facility-administered medications on file prior to visit.    Allergies  Allergen Reactions  . Doxycycline Hives and Shortness Of Breath  . Glyxambi [Empagliflozin-Linagliptin] Itching  . Mobic [Meloxicam]     "feeling of paralysis"  . Sulfa Antibiotics     Per positive allergy test   Social History   Socioeconomic History  . Marital status: Divorced    Spouse name: Not on file  . Number of children: Not on file  . Years of education: Not on file  . Highest education level: Not on file  Occupational History  . Not on file  Social Needs  . Financial resource strain: Not on file  . Food insecurity:    Worry: Not on file    Inability: Not on file  . Transportation needs:    Medical: Not on file    Non-medical: Not on file  Tobacco Use  . Smoking status: Former Smoker    Packs/day: 1.50    Years: 30.00    Pack years: 45.00    Types: Cigarettes    Last attempt to quit: 2013    Years since quitting: 6.3  . Smokeless tobacco: Never Used  Substance and Sexual Activity  . Alcohol use: No  . Drug use: No  . Sexual activity: Not on file    Comment: married  Lifestyle  . Physical activity:    Days per week: Not on file    Minutes per session: Not on file  . Stress: Not on file  Relationships  . Social connections:    Talks on phone: Not on file    Gets together: Not on file    Attends religious service: Not on file    Active member of club or organization: Not on file    Attends meetings of clubs or organizations: Not on file    Relationship status:  Not on file  . Intimate partner violence:    Fear of current or ex partner: Not on file    Emotionally abused: Not on file    Physically abused: Not on file    Forced sexual activity: Not on file  Other Topics Concern  . Not on file  Social  History Narrative  . Not on file   Family History  Problem Relation Age of Onset  . Hypertension Mother   . Cancer Mother        cervical  . Cancer Father        leukemia  . Hyperlipidemia Sister   . Hyperlipidemia Brother   . Heart attack Brother 52  . Stroke Brother   . Leukemia Son   . Colon cancer Neg Hx   . Esophageal cancer Neg Hx   . Rectal cancer Neg Hx   . Stomach cancer Neg Hx   . Thyroid disease Neg Hx       Review of Systems  All other systems reviewed and are negative.      Objective:   Physical Exam  Constitutional: She is oriented to person, place, and time. She appears well-developed and well-nourished. No distress.  HENT:  Head: Normocephalic and atraumatic.  Right Ear: External ear normal.  Left Ear: External ear normal.  Nose: Nose normal.  Mouth/Throat: Oropharynx is clear and moist. No oropharyngeal exudate.  Eyes: Conjunctivae are normal. Right eye exhibits no discharge. Left eye exhibits no discharge. No scleral icterus.  Neck: Normal range of motion. Neck supple. No JVD present. No tracheal deviation present. No thyromegaly present.  Cardiovascular: Normal rate, regular rhythm, normal heart sounds and intact distal pulses. Exam reveals no gallop and no friction rub.  No murmur heard. Pulmonary/Chest: Effort normal and breath sounds normal. No stridor. No respiratory distress. She has no wheezes. She has no rales. She exhibits no tenderness.  Abdominal: Soft. Bowel sounds are normal. She exhibits no distension and no mass. There is no tenderness. There is no rebound and no guarding.  Musculoskeletal: Normal range of motion. She exhibits no edema, tenderness or deformity.  Lymphadenopathy:    She has no  cervical adenopathy.  Neurological: She is alert and oriented to person, place, and time. She has normal reflexes. She displays normal reflexes. No cranial nerve deficit. She exhibits normal muscle tone. Coordination normal.  Skin: Skin is warm. No rash noted. She is not diaphoretic. No erythema. No pallor.  Psychiatric: She has a normal mood and affect. Her behavior is normal. Judgment and thought content normal.  Vitals reviewed.    Patient is blind in her left eye.  Pupils are equal round reactive in right eye.  Right tympanic membrane is dull erythematous with a middle ear effusion.  She was recently treated with amoxicillin for an ear infection however it has not completely resolved.     Assessment & Plan:  Hypothyroidism, unspecified type - Plan: TSH  Controlled type 2 diabetes mellitus with complication, without long-term current use of insulin ([place]) - Plan: CBC with Differential/Platelet, COMPLETE METABOLIC PANEL WITH GFR, Lipid panel, Hemoglobin A1c, Microalbumin, urine  Hypomagnesemia - Plan: Magnesium  Colon cancer screening - Plan: Ambulatory referral to Gastroenterology  Breast cancer screening - Plan: MM Digital Screening  General medical exam  I will treat her ear infection with Omnicef 300 mg p.o. twice daily for 10 days.  Regarding her hypothyroidism, I will check a TSH to monitor and ensure adequate doses of her levothyroxine.  Regarding her diabetes, I will check hemoglobin A1c.  Goal hemoglobin A1c is less than 7.  I will also check a urine microalbumin as well as a fasting lipid panel.  Ideally goal LDL cholesterol would be less than 100.  Given her history of hypomagnesemia I will check a magnesium level.  I will schedule the  patient follow-up with her gastroenterologist for repeat colonoscopy.  I will also schedule her for a mammogram.  Given her history of a hysterectomy, she does not require Pap smear.  She declines HIV screening.  Immunizations are up-to-date.

## 2018-03-15 LAB — HEMOGLOBIN A1C
EAG (MMOL/L): 7.1 (calc)
Hgb A1c MFr Bld: 6.1 % of total Hgb — ABNORMAL HIGH (ref <5.7)
MEAN PLASMA GLUCOSE: 128 (calc)

## 2018-03-15 LAB — MICROALBUMIN, URINE: Microalb, Ur: 1 mg/dL

## 2018-03-16 ENCOUNTER — Other Ambulatory Visit: Payer: Self-pay | Admitting: Family Medicine

## 2018-03-16 MED ORDER — OMEPRAZOLE 20 MG PO CPDR: 20.0000 mg | DELAYED_RELEASE_CAPSULE | Freq: Every day | ORAL | 3 refills | Status: DC

## 2018-03-21 ENCOUNTER — Other Ambulatory Visit: Payer: Self-pay | Admitting: Family Medicine

## 2018-03-21 DIAGNOSIS — I10 Essential (primary) hypertension: Secondary | ICD-10-CM

## 2018-05-09 ENCOUNTER — Ambulatory Visit (AMBULATORY_SURGERY_CENTER): Payer: Self-pay

## 2018-05-09 VITALS — Ht 66.0 in | Wt 268.8 lb

## 2018-05-09 DIAGNOSIS — Z86010 Personal history of colonic polyps: Secondary | ICD-10-CM

## 2018-05-09 MED ORDER — NA SULFATE-K SULFATE-MG SULF 17.5-3.13-1.6 GM/177ML PO SOLN: 1.0000 | Freq: Once | ORAL | 0 refills | Status: AC

## 2018-05-09 NOTE — Progress Notes (Signed)
Per pt, no allergies to soy or egg products.Pt not taking any weight loss meds or using  O2 at home.  Pt refused emmi video. 

## 2018-05-19 ENCOUNTER — Other Ambulatory Visit: Payer: Self-pay | Admitting: Family Medicine

## 2018-05-19 ENCOUNTER — Other Ambulatory Visit: Payer: Self-pay | Admitting: Internal Medicine

## 2018-05-19 NOTE — Telephone Encounter (Signed)
Please review for refill, Thanks !  

## 2018-05-22 ENCOUNTER — Encounter: Payer: Self-pay | Admitting: Internal Medicine

## 2018-05-24 ENCOUNTER — Ambulatory Visit: Payer: Medicare HMO

## 2018-05-30 ENCOUNTER — Ambulatory Visit (AMBULATORY_SURGERY_CENTER): Payer: Medicare HMO | Admitting: Internal Medicine

## 2018-05-30 ENCOUNTER — Encounter: Payer: Self-pay | Admitting: Internal Medicine

## 2018-05-30 VITALS — BP 141/83 | HR 72 | Temp 96.4°F | Resp 15 | Ht 66.0 in | Wt 272.0 lb

## 2018-05-30 DIAGNOSIS — K219 Gastro-esophageal reflux disease without esophagitis: Secondary | ICD-10-CM | POA: Diagnosis not present

## 2018-05-30 DIAGNOSIS — D124 Benign neoplasm of descending colon: Secondary | ICD-10-CM

## 2018-05-30 DIAGNOSIS — Z86010 Personal history of colonic polyps: Secondary | ICD-10-CM

## 2018-05-30 DIAGNOSIS — K635 Polyp of colon: Secondary | ICD-10-CM

## 2018-05-30 DIAGNOSIS — I10 Essential (primary) hypertension: Secondary | ICD-10-CM | POA: Diagnosis not present

## 2018-05-30 DIAGNOSIS — J449 Chronic obstructive pulmonary disease, unspecified: Secondary | ICD-10-CM | POA: Diagnosis not present

## 2018-05-30 DIAGNOSIS — D120 Benign neoplasm of cecum: Secondary | ICD-10-CM

## 2018-05-30 DIAGNOSIS — K6389 Other specified diseases of intestine: Secondary | ICD-10-CM

## 2018-05-30 DIAGNOSIS — E119 Type 2 diabetes mellitus without complications: Secondary | ICD-10-CM | POA: Diagnosis not present

## 2018-05-30 DIAGNOSIS — E039 Hypothyroidism, unspecified: Secondary | ICD-10-CM | POA: Diagnosis not present

## 2018-05-30 MED ORDER — SODIUM CHLORIDE 0.9 % IV SOLN: 500.0000 mL | Freq: Once | INTRAVENOUS | Status: DC

## 2018-05-30 NOTE — Patient Instructions (Signed)
YOU HAD AN ENDOSCOPIC PROCEDURE TODAY AT [facility]:   Refer to the procedure report that was given to you for any specific questions about what was found during the examination.  If the procedure report does not answer your questions, please call your gastroenterologist to clarify.  If you requested that your care partner not be given the details of your procedure findings, then the procedure report has been included in a sealed envelope for you to review at your convenience later.  YOU SHOULD EXPECT: Some feelings of bloating in the abdomen. Passage of more gas than usual.  Walking can help get rid of the air that was put into your GI tract during the procedure and reduce the bloating. If you had a lower endoscopy (such as a colonoscopy or flexible sigmoidoscopy) you may notice spotting of blood in your stool or on the toilet paper. If you underwent a bowel prep for your procedure, you may not have a normal bowel movement for a few days.  Please Note:  You might notice some irritation and congestion in your nose or some drainage.  This is from the oxygen used during your procedure.  There is no need for concern and it should clear up in a day or so.  SYMPTOMS TO REPORT IMMEDIATELY:   Following lower endoscopy (colonoscopy or flexible sigmoidoscopy):  Excessive amounts of blood in the stool  Significant tenderness or worsening of abdominal pains  Swelling of the abdomen that is new, acute  Fever of 100F or higher  For urgent or emergent issues, a gastroenterologist can be reached at any hour by calling [phone].   DIET:  We do recommend a small meal at first, but then you may proceed to your regular diet.  Drink plenty of fluids but you should avoid alcoholic beverages for 24 hours.  ACTIVITY:  You should plan to take it easy for the rest of today and you should NOT DRIVE or use heavy machinery until tomorrow (because of the sedation medicines used during the test).     FOLLOW UP: Our staff will call the number listed on your records the next business day following your procedure to check on you and address any questions or concerns that you may have regarding the information given to you following your procedure. If we do not reach you, we will leave a message.  However, if you are feeling well and you are not experiencing any problems, there is no need to return our call.  We will assume that you have returned to your regular daily activities without incident.  If any biopsies were taken you will be contacted by phone or by letter within the next 1-3 weeks.  Please call us at [phone] if you have not heard about the biopsies in 3 weeks.    SIGNATURES/CONFIDENTIALITY: You and/or your care partner have signed paperwork which will be entered into your electronic medical record.  These signatures attest to the fact that that the information above on your After Visit Summary has been reviewed and is understood.  Full responsibility of the confidentiality of this discharge information lies with you and/or your care-partner.    Handouts were given to your care partner on polyps and hemorrhoids. You may resume your current medications today. Await biopsy results. Your blood sugar was 113 in the recovery room. Please call if any questions or concerns.

## 2018-05-30 NOTE — Progress Notes (Signed)
Report to PACU, RN, vss, BBS= Clear.  

## 2018-05-30 NOTE — Progress Notes (Signed)
Called to room to assist during endoscopic procedure.  Patient ID and intended procedure confirmed with present staff. Received instructions for my participation in the procedure from the performing physician.  

## 2018-05-30 NOTE — Op Note (Signed)
[facility] Patient Name: Yolanda Gallegos Procedure Date: 05/30/2018 10:34 AM MRN: [id number] Endoscopist: Jerene Bears , MD Age: 57 Referring MD:  Date of Birth: [birth year] Gender: Female Account #: [id number] Procedure:                Colonoscopy Indications:              Surveillance: Personal history of adenomatous                            polyps on last colonoscopy 3 years ago Medicines:                Monitored Anesthesia Care Procedure:                Pre-Anesthesia Assessment:                           - Prior to the procedure, a History and Physical                            was performed, and patient medications and                            allergies were reviewed. The patient's tolerance of                            previous anesthesia was also reviewed. The risks                            and benefits of the procedure and the sedation                            options and risks were discussed with the patient.                            All questions were answered, and informed consent                            was obtained. Prior Anticoagulants: The patient has                            taken no previous anticoagulant or antiplatelet                            agents. ASA Grade Assessment: III - A patient with                            severe systemic disease. After reviewing the risks                            and benefits, the patient was deemed in                            satisfactory condition to undergo the procedure.  After obtaining informed consent, the colonoscope                            was passed under direct vision. Throughout the                            procedure, the patient's blood pressure, pulse, and                            oxygen saturations were monitored continuously. The                            Colonoscope was introduced through the anus and                            advanced to the cecum,  identified by appendiceal                            orifice and ileocecal valve. The colonoscopy was                            performed without difficulty. The patient tolerated                            the procedure well. The quality of the bowel                            preparation was good. The ileocecal valve,                            appendiceal orifice, and rectum were photographed. Scope In: 10:44:29 AM Scope Out: 11:07:11 AM Scope Withdrawal Time: 0 hours 16 minutes 22 seconds  Total Procedure Duration: 0 hours 22 minutes 42 seconds  Findings:                 The digital rectal exam was normal.                           A 2 mm polyp was found in the cecum. The polyp was                            sessile. The polyp was removed with a cold biopsy                            forceps. Resection and retrieval were complete.                           A 4 mm polyp was found in the cecum. The polyp was                            sessile. The polyp was removed with a cold snare.  Resection and retrieval were complete.                           A 5 mm polyp was found in the descending colon. The                            polyp was sessile. The polyp was removed with a                            cold snare. Resection and retrieval were complete.                           The colon (entire examined portion) was moderately                            redundant. Manual counter-pressure used to reach                            the cecum (right lower quadrant toward umbilicus to                            deeply intubate cecum).                           Internal hemorrhoids were found during                            retroflexion. The hemorrhoids were small. Complications:            No immediate complications. Estimated Blood Loss:     Estimated blood loss was minimal. Impression:               - One 2 mm polyp in the cecum, removed with a cold                             biopsy forceps. Resected and retrieved.                           - One 4 mm polyp in the cecum, removed with a cold                            snare. Resected and retrieved.                           - One 5 mm polyp in the descending colon, removed                            with a cold snare. Resected and retrieved.                           - Small internal hemorrhoids. Recommendation:           - Patient has a contact number available for  emergencies. The signs and symptoms of potential                            delayed complications were discussed with the                            patient. Return to normal activities tomorrow.                            Written discharge instructions were provided to the                            patient.                           - Resume previous diet.                           - Continue present medications.                           - Await pathology results.                           - Repeat colonoscopy is recommended for                            surveillance. The colonoscopy date will be                            determined after pathology results from today's                            exam become available for review. Jerene Bears, MD 05/30/2018 11:11:26 AM This report has been signed electronically.

## 2018-05-30 NOTE — Progress Notes (Signed)
No problems noted in the recovery room. maw 

## 2018-05-30 NOTE — Progress Notes (Signed)
I have reviewed the patient's medical history in detail and updated the computerized patient record.

## 2018-05-31 ENCOUNTER — Telehealth: Payer: Self-pay

## 2018-05-31 NOTE — Telephone Encounter (Signed)
  Follow up Call-  Call back number 05/30/2018  Post procedure Call Back phone  # [phone]  Permission to leave phone message Yes  Some recent data might be hidden     Patient questions:  Do you have a fever, pain , or abdominal swelling? No. Pain Score  0 *  Have you tolerated food without any problems? Yes.    Have you been able to return to your normal activities? Yes.    Do you have any questions about your discharge instructions: Diet   No. Medications  No. Follow up visit  No.  Do you have questions or concerns about your Care? No.  Actions: * If pain score is 4 or above: No action needed, pain <4.

## 2018-06-02 ENCOUNTER — Encounter: Payer: Self-pay | Admitting: Internal Medicine

## 2018-06-07 ENCOUNTER — Ambulatory Visit: Payer: Medicare HMO

## 2018-07-25 ENCOUNTER — Ambulatory Visit: Payer: Medicare HMO

## 2018-08-04 ENCOUNTER — Other Ambulatory Visit: Payer: Self-pay | Admitting: Internal Medicine

## 2018-08-04 ENCOUNTER — Other Ambulatory Visit: Payer: Self-pay | Admitting: Endocrinology

## 2018-08-07 NOTE — Telephone Encounter (Signed)
Please advise last ov 09/2018

## 2018-08-07 NOTE — Telephone Encounter (Signed)
Options: refill x 1--Ov is due Refer request to PCP

## 2018-08-08 ENCOUNTER — Ambulatory Visit (INDEPENDENT_AMBULATORY_CARE_PROVIDER_SITE_OTHER): Payer: Medicare HMO | Admitting: Family Medicine

## 2018-08-08 ENCOUNTER — Encounter: Payer: Self-pay | Admitting: Family Medicine

## 2018-08-08 ENCOUNTER — Ambulatory Visit
Admission: RE | Admit: 2018-08-08 | Discharge: 2018-08-08 | Disposition: A | Discharge status: Home / Self Care | Payer: Medicare HMO | Source: Ambulatory Visit | Attending: Family Medicine | Admitting: Family Medicine

## 2018-08-08 VITALS — BP 140/90 | HR 88 | Temp 97.7°F | Resp 18 | Ht 66.0 in | Wt 267.0 lb

## 2018-08-08 DIAGNOSIS — Z23 Encounter for immunization: Secondary | ICD-10-CM

## 2018-08-08 DIAGNOSIS — E039 Hypothyroidism, unspecified: Secondary | ICD-10-CM | POA: Diagnosis not present

## 2018-08-08 DIAGNOSIS — E118 Type 2 diabetes mellitus with unspecified complications: Secondary | ICD-10-CM | POA: Diagnosis not present

## 2018-08-08 DIAGNOSIS — M722 Plantar fascial fibromatosis: Secondary | ICD-10-CM

## 2018-08-08 DIAGNOSIS — M5432 Sciatica, left side: Secondary | ICD-10-CM

## 2018-08-08 DIAGNOSIS — M47816 Spondylosis without myelopathy or radiculopathy, lumbar region: Secondary | ICD-10-CM | POA: Diagnosis not present

## 2018-08-08 NOTE — Addendum Note (Signed)
Addended by: Shary Decamp B on: 08/08/2018 09:56 AM   Modules accepted: Orders

## 2018-08-08 NOTE — Progress Notes (Signed)
Subjective:    Patient ID: Yolanda Gallegos, female    DOB: March 07, 1961, 57 y.o.   MRN: [id number]  HPI Patient reports chronic low back pain.  It has been present off and on for the last 2 years.  The pain is located in the center of her back or slightly to the left at the level of L4-L5.  She states that with prolonged standing, she will develop a dull aching pain that radiates down her left leg.  She also occasionally reports numbness in her left leg and pain shooting down her leg into her foot all the way to her toes.  She states over the last 2 months the pain is progressively worsened.  We try to order an MRI to evaluate this in the past however insurance declined that.  They recommended physical therapy.  The patient went to a few sessions and has been doing the exercises on her own at home however the pain has steadily worsened and over the last 2 months has become unbearable.  She also complains of pain in her left heel at the insertion of the plantar fascia.  The pain radiates up her arch towards her toes.  Hurts to stand and walk.  She is walking barefoot around the house.  Also on physical exam, her right leg appears to be approximately 1 cm shorter than her left leg.  She is also due for fasting lab work for her diabetes, hypomagnesemia, and hypothyroidism Past Medical History:  Diagnosis Date  . Allergy   . Altered gait    USES CANE TO JUDGE DISTANCE DUE TO BLINDNESS IN LEFT EYE  . Colon polyps 2015  . COPD (chronic obstructive pulmonary disease) ([place])   . Diabetes mellitus without complication ([place])   . Emphysema of lung ([place])   . GERD (gastroesophageal reflux disease)   . Hyperlipidemia   . Hypertension   . Legally blind in left eye, as defined in [place] 2009  . Meningioma ([place]) 2009   on left side brain behind left eye/ bliindness left eye  . Multinodular goiter    subclinical hyperthyroidism  . Smoker   . Subclinical hyperthyroidism    due to multinodular goiter.  Confirmed on  [place] escan 2015  . Tubular adenoma 2015   Past Surgical History:  Procedure Laterality Date  . ABDOMINAL HYSTERECTOMY  1999   left oopherectomy  . 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 BREAST BIOPSY Left 2007   benign  . [facility]   3 times  . INNER EAR SURGERY Right 2001  . LEFT HEART CATH AND CORONARY ANGIOGRAPHY N/A 04/22/2017   Procedure: Left Heart Cath and Coronary Angiography;  Surgeon: Nelva Bush, MD;  Location: [facility];  Service: Cardiovascular;  Laterality: N/A;   Current Outpatient Medications on File Prior to Visit  Medication Sig Dispense Refill  . ACCU-CHEK AVIVA PLUS test strip TEST BLOOD SUGAR TWICE DAILY TO THREE TIMES DAILY 300 each 4  . ACCU-CHEK SOFTCLIX LANCETS lancets USE AS INSTRUCTED 300 each 4  . acetaminophen (TYLENOL) 500 MG tablet Take 1,000 mg by mouth 3 (three) times daily as needed for moderate pain or headache.     . Alcohol Swabs (B-D SINGLE USE SWABS REGULAR) PADS USE AS DIRECTED AS NEEDED FOR FINGERSTICK AND INSULIN INJECTION 300 each 4  . amLODipine (NORVASC) 5 MG tablet TAKE 1 TABLET EVERY DAY 90 tablet 2  .  aspirin EC 81 MG tablet Take 1 tablet (81 mg total) by mouth daily. 90 tablet 3  . atorvastatin (LIPITOR) 40 MG tablet TAKE 1 TABLET AT BEDTIME 90 tablet 3  . Blood Glucose Monitoring Suppl (ACCU-CHEK AVIVA PLUS) w/Device KIT TEST BLOOD SUGAR TWICE DAILY TO THREE TIMES DAILY 1 kit 0  . Calcium Citrate-Vitamin D (CALCIUM CITRATE + D PO) Take 500 Units by mouth daily.    . cefdinir (OMNICEF) 300 MG capsule Take 1 capsule (300 mg total) by mouth 2 (two) times daily. 20 capsule 0  . Dulaglutide (TRULICITY) 6.65 LD/3.5TS SOPN Inject SQ weekly (Patient taking differently: Inject 0.75 mg as directed every Monday. ) 12 pen 4  . fluticasone (FLONASE) 50 MCG/ACT nasal spray USE 2 SPRAYS IN EACH NOSTRIL EVERY DAY 48 g 3  . hydrocortisone (PROCTOZONE-HC) 2.5 % rectal cream Place 1  application rectally 2 (two) times daily as needed for hemorrhoids or itching.    . levothyroxine (SYNTHROID, LEVOTHROID) 100 MCG tablet TAKE 1 TABLET EVERY DAY BEFORE BREAKFAST 90 tablet 2  . losartan (COZAAR) 100 MG tablet TAKE 1 TABLET EVERY DAY 90 tablet 3  . Magnesium 400 MG TABS Take by mouth daily.    . Magnesium Oxide 400 MG CAPS Take 1 capsule (400 mg total) by mouth daily. 270 each 0  . metoprolol succinate (TOPROL-XL) 50 MG 24 hr tablet TAKE 1 TABLET (50 MG TOTAL) BY MOUTH 2 (TWO) TIMES DAILY. 180 tablet 0  . omeprazole (PRILOSEC) 20 MG capsule Take 1 capsule (20 mg total) by mouth daily. 90 capsule 3  . potassium chloride SA (K-DUR,KLOR-CON) 20 MEQ tablet TAKE 2 TABLETS EVERY DAY 180 tablet 0  . terbinafine (LAMISIL) 1 % cream Apply 1 application topically daily as needed (athletes foot).    . triamcinolone ointment (KENALOG) 0.1 % APPLY TWICE A DAY FOR 10 DAYS AS NEEDED FOR RASH (Patient taking differently: apply twice daily as needed for rash) 454 g 1  . vitamin E 400 UNIT capsule Take 400 Units by mouth daily.    Marland Kitchen ZYRTEC ALLERGY 10 MG tablet Take 10 mg by mouth daily.      Current Facility-Administered Medications on File Prior to Visit  Medication Dose Route Frequency Provider Last Rate Last Dose  . 0.9 %  sodium chloride infusion  500 mL Intravenous Once Pyrtle, Lajuan Lines, MD       Allergies  Allergen Reactions  . Doxycycline Hives and Shortness Of Breath  . Mobic [Meloxicam]     "feeling of paralysis"  . Glyxambi [Empagliflozin-Linagliptin] Itching  . Sulfa Antibiotics     Per positive allergy test   Social History   Socioeconomic History  . Marital status: Divorced    Spouse name: Not on file  . Number of children: Not on file  . Years of education: Not on file  . Highest education level: Not on file  Occupational History  . Not on file  Social Needs  . Financial resource strain: Not on file  . Food insecurity:    Worry: Not on file    Inability: Not on file    . Transportation needs:    Medical: Not on file    Non-medical: Not on file  Tobacco Use  . Smoking status: Former Smoker    Packs/day: 1.50    Years: 30.00    Pack years: 45.00    Types: Cigarettes    Last attempt to quit: 2013    Years since quitting: 6.7  .  Smokeless tobacco: Never Used  Substance and Sexual Activity  . Alcohol use: No  . Drug use: No  . Sexual activity: Not on file    Comment: married  Lifestyle  . Physical activity:    Days per week: Not on file    Minutes per session: Not on file  . Stress: Not on file  Relationships  . Social connections:    Talks on phone: Not on file    Gets together: Not on file    Attends religious service: Not on file    Active member of club or organization: Not on file    Attends meetings of clubs or organizations: Not on file    Relationship status: Not on file  . Intimate partner violence:    Fear of current or ex partner: Not on file    Emotionally abused: Not on file    Physically abused: Not on file    Forced sexual activity: Not on file  Other Topics Concern  . Not on file  Social History Narrative  . Not on file   Family History  Problem Relation Age of Onset  . Hypertension Mother   . Cancer Mother        cervical  . Cancer Father        leukemia  . Hyperlipidemia Sister   . Hyperlipidemia Brother   . Heart attack Brother 80  . Stroke Brother   . Leukemia Son   . Colon cancer Neg Hx   . Esophageal cancer Neg Hx   . Rectal cancer Neg Hx   . Stomach cancer Neg Hx   . Thyroid disease Neg Hx       Review of Systems  Musculoskeletal: Positive for back pain.  All other systems reviewed and are negative.      Objective:   Physical Exam  Constitutional: She is oriented to person, place, and time. She appears well-developed and well-nourished.  Cardiovascular: Normal rate, regular rhythm, normal heart sounds and intact distal pulses. Exam reveals no gallop and no friction rub.  No murmur  heard. Pulmonary/Chest: Effort normal and breath sounds normal. No respiratory distress. She has no wheezes. She has no rales. She exhibits no tenderness.  Musculoskeletal: She exhibits no edema or deformity.       Lumbar back: She exhibits decreased range of motion, tenderness, bony tenderness and pain. She exhibits no swelling and no spasm.       Left foot: There is tenderness and bony tenderness.       Feet:  Neurological: She is alert and oriented to person, place, and time. She has normal reflexes. She displays normal reflexes. She exhibits normal muscle tone.  Vitals reviewed.       Assessment & Plan:  Left sided sciatica - Plan: DG Lumbar Spine Complete  Controlled type 2 diabetes mellitus with complication, without long-term current use of insulin (HCC) - Plan: Hemoglobin A1c, CBC with Differential/Platelet, COMPLETE METABOLIC PANEL WITH GFR, Lipid panel, Microalbumin, urine  Hypomagnesemia - Plan: Magnesium  Hypothyroidism, unspecified type - Plan: TSH  I believe the patient's low back pain is likely due to degenerative disc disease.  Given her symptoms, I suspect that she may also have an element of left-sided sciatica due to nerve impingement most likely the level of L4-L5.  I have recommended an x-ray of the lumbar spine to evaluate further.  Patient may benefit from cortisone injections in that level if this is in fact confirmed to be the cause.  She is tried ibuprofen rest and physical therapy without relief.  Per her request I will check fasting lab work including a hemoglobin A1c, fasting lipid panel, magnesium level, and a TSH to monitor her hypothyroidism as well as her diabetes.  Last labs were drawn in April and were excellent.  She will receive her flu shot today.  I believe the pain on the plantar aspect of her left foot represents plantar fasciitis.  I recommended that she start wearing tennis shoes around the house with good supportive arch supporting insoles to take the  pressure off the plantar fascia to see if this will improve her pain.  If not we could perform a cortisone injection in the left heel.

## 2018-08-09 ENCOUNTER — Other Ambulatory Visit: Payer: Self-pay | Admitting: Family Medicine

## 2018-08-09 LAB — COMPLETE METABOLIC PANEL WITH GFR
AG Ratio: 1.6 (calc) (ref 1.0–2.5)
ALBUMIN MSPROF: 4.5 g/dL (ref 3.6–5.1)
ALT: 23 U/L (ref 6–29)
AST: 18 U/L (ref 10–35)
Alkaline phosphatase (APISO): 85 U/L (ref 33–130)
BUN: 13 mg/dL (ref 7–25)
CALCIUM: 10.2 mg/dL (ref 8.6–10.4)
CO2: 33 mmol/L — AB (ref 20–32)
CREATININE: 0.63 mg/dL (ref 0.50–1.05)
Chloride: 104 mmol/L (ref 98–110)
GFR, EST NON AFRICAN AMERICAN: 102 mL/min/{1.73_m2} (ref >=60)
GFR, Est African American: 118 mL/min/{1.73_m2} (ref >=60)
GLOBULIN: 2.8 g/dL (ref 1.9–3.7)
Glucose, Bld: 104 mg/dL — ABNORMAL HIGH (ref 65–99)
Potassium: 4.1 mmol/L (ref 3.5–5.3)
SODIUM: 143 mmol/L (ref 135–146)
Total Bilirubin: 0.6 mg/dL (ref 0.2–1.2)
Total Protein: 7.3 g/dL (ref 6.1–8.1)

## 2018-08-09 LAB — LIPID PANEL
Cholesterol: 201 mg/dL — ABNORMAL HIGH (ref <200)
HDL: 48 mg/dL — ABNORMAL LOW (ref >50)
LDL CHOLESTEROL (CALC): 121 mg/dL — AB
Non-HDL Cholesterol (Calc): 153 mg/dL (calc) — ABNORMAL HIGH (ref <130)
Total CHOL/HDL Ratio: 4.2 (calc) (ref <5.0)
Triglycerides: 200 mg/dL — ABNORMAL HIGH (ref <150)

## 2018-08-09 LAB — CBC WITH DIFFERENTIAL/PLATELET
BASOS ABS: 28 {cells}/uL (ref 0–200)
Basophils Relative: 0.3 %
EOS PCT: 2.4 %
Eosinophils Absolute: 226 cells/uL (ref 15–500)
HEMATOCRIT: 42.6 % (ref 35.0–45.0)
Hemoglobin: 14.1 g/dL (ref 11.7–15.5)
LYMPHS ABS: 3140 {cells}/uL (ref 850–3900)
MCH: 29.8 pg (ref 27.0–33.0)
MCHC: 33.1 g/dL (ref 32.0–36.0)
MCV: 90.1 fL (ref 80.0–100.0)
MPV: 9.8 fL (ref 7.5–12.5)
Monocytes Relative: 8.2 %
Neutro Abs: 5236 cells/uL (ref 1500–7800)
Neutrophils Relative %: 55.7 %
Platelets: 305 10*3/uL (ref 140–400)
RBC: 4.73 10*6/uL (ref 3.80–5.10)
RDW: 12.4 % (ref 11.0–15.0)
Total Lymphocyte: 33.4 %
WBC mixed population: 771 cells/uL (ref 200–950)
WBC: 9.4 10*3/uL (ref 3.8–10.8)

## 2018-08-09 LAB — MAGNESIUM: Magnesium: 1.7 mg/dL (ref 1.5–2.5)

## 2018-08-09 LAB — HEMOGLOBIN A1C
Hgb A1c MFr Bld: 5.9 % of total Hgb — ABNORMAL HIGH (ref <5.7)
Mean Plasma Glucose: 123 (calc)
eAG (mmol/L): 6.8 (calc)

## 2018-08-09 LAB — MICROALBUMIN, URINE: MICROALB UR: 1.1 mg/dL

## 2018-08-09 LAB — TSH: TSH: 1.42 m[IU]/L

## 2018-08-09 MED ORDER — PREDNISONE 20 MG PO TABS: ORAL_TABLET | ORAL | 0 refills | Status: DC

## 2018-08-14 ENCOUNTER — Other Ambulatory Visit: Payer: Self-pay | Admitting: Family Medicine

## 2018-08-14 MED ORDER — ATORVASTATIN CALCIUM 80 MG PO TABS: 80.0000 mg | ORAL_TABLET | Freq: Every day | ORAL | 3 refills | Status: DC

## 2018-09-06 ENCOUNTER — Ambulatory Visit: Payer: Medicare HMO | Admitting: Internal Medicine

## 2018-09-27 ENCOUNTER — Encounter: Payer: Self-pay | Admitting: Internal Medicine

## 2018-09-27 ENCOUNTER — Ambulatory Visit
Admission: RE | Admit: 2018-09-27 | Discharge: 2018-09-27 | Disposition: A | Discharge status: Home / Self Care | Payer: Medicare HMO | Source: Ambulatory Visit | Attending: Family Medicine | Admitting: Family Medicine

## 2018-09-27 ENCOUNTER — Ambulatory Visit: Payer: Medicare HMO | Admitting: Internal Medicine

## 2018-09-27 VITALS — BP 132/62 | HR 79 | Ht 66.0 in | Wt 270.0 lb

## 2018-09-27 DIAGNOSIS — H5213 Myopia, bilateral: Secondary | ICD-10-CM | POA: Diagnosis not present

## 2018-09-27 DIAGNOSIS — I4729 Other ventricular tachycardia: Secondary | ICD-10-CM

## 2018-09-27 DIAGNOSIS — E782 Mixed hyperlipidemia: Secondary | ICD-10-CM | POA: Diagnosis not present

## 2018-09-27 DIAGNOSIS — H3589 Other specified retinal disorders: Secondary | ICD-10-CM | POA: Diagnosis not present

## 2018-09-27 DIAGNOSIS — I471 Supraventricular tachycardia: Secondary | ICD-10-CM

## 2018-09-27 DIAGNOSIS — H468 Other optic neuritis: Secondary | ICD-10-CM | POA: Diagnosis not present

## 2018-09-27 DIAGNOSIS — I472 Ventricular tachycardia: Secondary | ICD-10-CM | POA: Diagnosis not present

## 2018-09-27 DIAGNOSIS — Z9889 Other specified postprocedural states: Secondary | ICD-10-CM | POA: Diagnosis not present

## 2018-09-27 DIAGNOSIS — Z1239 Encounter for other screening for malignant neoplasm of breast: Secondary | ICD-10-CM

## 2018-09-27 DIAGNOSIS — I10 Essential (primary) hypertension: Secondary | ICD-10-CM

## 2018-09-27 DIAGNOSIS — H5347 Heteronymous bilateral field defects: Secondary | ICD-10-CM | POA: Diagnosis not present

## 2018-09-27 DIAGNOSIS — Z1231 Encounter for screening mammogram for malignant neoplasm of breast: Secondary | ICD-10-CM | POA: Diagnosis not present

## 2018-09-27 DIAGNOSIS — Z86018 Personal history of other benign neoplasm: Secondary | ICD-10-CM | POA: Diagnosis not present

## 2018-09-27 MED ORDER — MAGNESIUM OXIDE 400 MG PO CAPS: 400.0000 mg | ORAL_CAPSULE | Freq: Two times a day (BID) | ORAL | 3 refills | Status: DC

## 2018-09-27 NOTE — Patient Instructions (Signed)
Medication Instructions:  Your physician has recommended you make the following change in your medication:  1- INCREASE Magnesium Oxide to 400 mg by mouth two times a day.  If you need a refill on your cardiac medications before your next appointment, please call your pharmacy.   Lab work: none If you have labs (blood work) drawn today and your tests are completely normal, you will receive your results only by: Marland Kitchen MyChart Message (if you have MyChart) OR . A paper copy in the mail If you have any lab test that is abnormal or we need to change your treatment, we will call you to review the results.  Testing/Procedures: none  Follow-Up: At [facility], you and your health needs are our priority.  As part of our continuing mission to provide you with exceptional heart care, we have created designated Provider Care Teams.  These Care Teams include your primary Cardiologist (physician) and Advanced Practice Providers (APPs -  Physician Assistants and Nurse Practitioners) who all work together to provide you with the care you need, when you need it. You will need a follow up appointment in 12 months.  Please call our office 2 months in advance to schedule this appointment.  You may see DR Harrell Gave END or one of the following Advanced Practice Providers on your designated Care Team:   Murray Hodgkins, NP Christell Faith, PA-C . Marrianne Mood, PA-C

## 2018-09-27 NOTE — Progress Notes (Signed)
Follow-up Outpatient Visit Date: 09/27/2018  Primary Care Provider: [name], MD [address] [place] [address]  Chief Complaint: Follow-up palpitations  HPI:  Ms. Yolanda Gallegos is a 57 y.o. year-old female with history of nonsustained ventricular tachycardia in the setting of hypomagnesemia, diabetes mellitus, hypertension, hyperlipidemia, subclinical hyperthyroidism, and GERD, who presents for follow-up of palpitations.  I last saw her a year ago, which time she was doing well.  Today, she notes rare palpitations without associated symptoms.  She has not had any chest pain, shortness of breath, lightheadedness, or edema.  She notes that her home blood pressures typically similar to what are reading is in the office today.  Her atorvastatin was increased in September due to suboptimal lipid control by her PCP.  She is tolerating atorvastatin 80 mg daily well.  She is back on omeprazole, which previously caused hypomagnesemia, due to suboptimal control of her GERD on famotidine.  --------------------------------------------------------------------------------------------------  Cardiovascular Problems:  Palpitations with NSVT  Atypical chest pain  Risk Factors:  Diabetes mellitus, hypertension, hyperlipidemia, obesity, and history of tobacco use  Cath/PCI:  LHC (04/22/17): No angiographically significant coronary artery disease. LVEF 50-55% with upper normal to mildly elevated filling pressure.  CV Surgery:  None  EP Procedures and Devices:  14-day event monitor (02/17/17): Predominantly sinus rhythm with rare PSVT and NSVT, as well as rare PACs and PVCs.  Non-Invasive Evaluation(s):  Echo (03/22/2017): Normal LV size and wall thickness.  LVEF 55-60% with normal wall motion.  Mild left atrial enlargement.  Normal RV size and function.  Recent CV Pertinent Labs: Lab Results  Component Value Date   CHOL 201 (H) 08/08/2018   HDL 48 (L) 08/08/2018   LDLCALC  121 (H) 08/08/2018   TRIG 200 (H) 08/08/2018   CHOLHDL 4.2 08/08/2018   INR 1.0 04/14/2017   K 4.1 08/08/2018   MG 1.7 08/08/2018   BUN 13 08/08/2018   BUN 12 04/14/2017   CREATININE 0.63 08/08/2018    Past medical and surgical history were reviewed and updated in EPIC.  Current Meds  Medication Sig  . ACCU-CHEK AVIVA PLUS test strip TEST BLOOD SUGAR TWICE DAILY TO THREE TIMES DAILY  . ACCU-CHEK SOFTCLIX LANCETS lancets USE AS INSTRUCTED  . acetaminophen (TYLENOL) 500 MG tablet Take 1,000 mg by mouth 3 (three) times daily as needed for moderate pain or headache.   . Alcohol Swabs (B-D SINGLE USE SWABS REGULAR) PADS USE AS DIRECTED AS NEEDED FOR FINGERSTICK AND INSULIN INJECTION  . amLODipine (NORVASC) 5 MG tablet TAKE 1 TABLET EVERY DAY  . aspirin EC 81 MG tablet Take 1 tablet (81 mg total) by mouth daily.  Marland Kitchen atorvastatin (LIPITOR) 80 MG tablet Take 1 tablet (80 mg total) by mouth daily.  . Blood Glucose Monitoring Suppl (ACCU-CHEK AVIVA PLUS) w/Device KIT TEST BLOOD SUGAR TWICE DAILY TO THREE TIMES DAILY  . Calcium Citrate-Vitamin D (CALCIUM CITRATE + D PO) Take 500 Units by mouth daily.  . Dulaglutide (TRULICITY) 2.87 OM/7.6HM SOPN Inject SQ weekly (Patient taking differently: Inject 0.75 mg as directed every Monday. )  . fluticasone (FLONASE) 50 MCG/ACT nasal spray USE 2 SPRAYS IN EACH NOSTRIL EVERY DAY  . hydrocortisone (PROCTOZONE-HC) 2.5 % rectal cream Place 1 application rectally 2 (two) times daily as needed for hemorrhoids or itching.  . levothyroxine (SYNTHROID, LEVOTHROID) 100 MCG tablet TAKE 1 TABLET EVERY DAY BEFORE BREAKFAST  . losartan (COZAAR) 100 MG tablet TAKE 1 TABLET EVERY DAY  . Magnesium  400 MG TABS Take by mouth daily.  . Magnesium Oxide 400 MG CAPS Take 1 capsule (400 mg total) by mouth 2 (two) times daily.  . metoprolol succinate (TOPROL-XL) 50 MG 24 hr tablet TAKE 1 TABLET (50 MG TOTAL) BY MOUTH 2 (TWO) TIMES DAILY.  Marland Kitchen omeprazole (PRILOSEC) 20 MG capsule Take  1 capsule (20 mg total) by mouth daily.  . potassium chloride SA (K-DUR,KLOR-CON) 20 MEQ tablet TAKE 2 TABLETS EVERY DAY  . terbinafine (LAMISIL) 1 % cream Apply 1 application topically daily as needed (athletes foot).  . vitamin E 400 UNIT capsule Take 400 Units by mouth daily.  Marland Kitchen ZYRTEC ALLERGY 10 MG tablet Take 10 mg by mouth daily.   . [DISCONTINUED] Magnesium Oxide 400 MG CAPS Take 1 capsule (400 mg total) by mouth daily.   Current Facility-Administered Medications for the 09/27/18 encounter (Office Visit) with Raydell Maners, Harrell Gave, MD  Medication  . 0.9 %  sodium chloride infusion    Allergies: Doxycycline; Mobic [meloxicam]; Glyxambi [empagliflozin-linagliptin]; and Sulfa antibiotics  Social History   Tobacco Use  . Smoking status: Former Smoker    Packs/day: 1.50    Years: 30.00    Pack years: 45.00    Types: Cigarettes    Last attempt to quit: 2013    Years since quitting: 6.8  . Smokeless tobacco: Never Used  Substance Use Topics  . Alcohol use: No  . Drug use: No    Family History  Problem Relation Age of Onset  . Hypertension Mother   . Cancer Mother        cervical  . Cancer Father        leukemia  . Hyperlipidemia Sister   . Hyperlipidemia Brother   . Heart attack Brother 64  . Stroke Brother   . Leukemia Son   . Colon cancer Neg Hx   . Esophageal cancer Neg Hx   . Rectal cancer Neg Hx   . Stomach cancer Neg Hx   . Thyroid disease Neg Hx     Review of Systems: A 12-system review of systems was performed and was negative except as noted in the HPI.  --------------------------------------------------------------------------------------------------  Physical Exam: BP 132/62 (BP Location: Left Arm, Patient Position: Sitting, Cuff Size: Normal)   Pulse 79   Ht '5\' 6"'  (1.676 m)   Wt 270 lb (122.5 kg)   BMI 43.58 kg/m   General: NAD. HEENT: No conjunctival pallor or scleral icterus. Moist mucous membranes.  OP clear. Neck: Supple without  lymphadenopathy, thyromegaly, JVD, or HJR. No carotid bruit. Lungs: Normal work of breathing. Clear to auscultation bilaterally without wheezes or crackles. Heart: Regular rate and rhythm without murmurs, rubs, or gallops.  Unable to assess PMI due to body habitus. Abd: Bowel sounds present. Soft, NT/ND.  Unable to assess HSM due to body habitus. Ext: No lower extremity edema. Radial, PT, and DP pulses are 2+ bilaterally. Skin: Warm and dry without rash.  EKG: Normal sinus rhythm with borderline LVH and borderline QT prolongation.  Lab Results  Component Value Date   WBC 9.4 08/08/2018   HGB 14.1 08/08/2018   HCT 42.6 08/08/2018   MCV 90.1 08/08/2018   PLT 305 08/08/2018    Lab Results  Component Value Date   NA 143 08/08/2018   K 4.1 08/08/2018   CL 104 08/08/2018   CO2 33 (H) 08/08/2018   BUN 13 08/08/2018   CREATININE 0.63 08/08/2018   GLUCOSE 104 (H) 08/08/2018   ALT 23 08/08/2018  Lab Results  Component Value Date   CHOL 201 (H) 08/08/2018   HDL 48 (L) 08/08/2018   LDLCALC 121 (H) 08/08/2018   TRIG 200 (H) 08/08/2018   CHOLHDL 4.2 08/08/2018    --------------------------------------------------------------------------------------------------  ASSESSMENT AND PLAN: PSVT and NSVT Rare palpitations reported but no sustained arrhythmias.  Previous episodes were likely due to marked hypomagnesemia.  Most recent magnesium level in September was a little low at 1.7.  Given continued omeprazole use, I recommended that Ms. would increase her magnesium oxide to 400 mg twice daily.  We will continue metoprolol succinate 50 mg twice daily.  Hypertension Blood pressure mildly elevated (goal less than 130/80).  I have recommended that she minimize her sodium intake and try to lose weight.  No further medication changes at this time.  Further management deferred to Dr. Dennard Schaumann.  Hyperlipidemia Given history of diabetes mellitus, I agree with targeting an LDL less than 100.  I  will defer rechecking lipids to Ms. Suhre's PCP.  Follow-up: Return to clinic in 1 year.  Nelva Bush, MD 09/27/2018 4:12 PM

## 2018-10-14 ENCOUNTER — Other Ambulatory Visit: Payer: Self-pay | Admitting: Family Medicine

## 2018-10-14 ENCOUNTER — Other Ambulatory Visit: Payer: Self-pay | Admitting: Internal Medicine

## 2018-10-14 ENCOUNTER — Other Ambulatory Visit: Payer: Self-pay | Admitting: Endocrinology

## 2018-10-14 DIAGNOSIS — I10 Essential (primary) hypertension: Secondary | ICD-10-CM

## 2018-10-14 NOTE — Telephone Encounter (Signed)
Options: 1. Please refill x 1.  Ov is due 2. Ask PCP for refill

## 2018-11-01 ENCOUNTER — Other Ambulatory Visit: Payer: Self-pay | Admitting: Family Medicine

## 2018-11-01 MED ORDER — HYDROCORTISONE 2.5 % RE CREA: 1.0000 "application " | TOPICAL_CREAM | Freq: Two times a day (BID) | RECTAL | 3 refills | Status: DC | PRN

## 2018-11-01 MED ORDER — LEVOTHYROXINE SODIUM 100 MCG PO TABS: ORAL_TABLET | ORAL | 3 refills | Status: DC

## 2018-11-07 ENCOUNTER — Telehealth: Payer: Self-pay | Admitting: Family Medicine

## 2018-11-07 NOTE — Telephone Encounter (Signed)
Pt has ?'s about her hydrocortisone cream that was called in to pharmacy please call back.

## 2018-11-13 MED ORDER — HYDROCORTISONE 1 % RE CREA: TOPICAL_CREAM | RECTAL | 3 refills | Status: DC

## 2018-11-13 NOTE — Telephone Encounter (Signed)
Ins covered proctozone cream as a name brand and she needs a generic one. I sent in generic and informed pt that if that was too high to call ins and see what they will cover.

## 2018-11-24 ENCOUNTER — Other Ambulatory Visit: Payer: Self-pay | Admitting: Family Medicine

## 2018-11-24 MED ORDER — HYDROCORTISONE 1 % EX CREA: 1.0000 "application " | TOPICAL_CREAM | Freq: Two times a day (BID) | CUTANEOUS | 3 refills | Status: DC

## 2018-12-15 ENCOUNTER — Other Ambulatory Visit: Payer: Self-pay | Admitting: Family Medicine

## 2018-12-15 ENCOUNTER — Other Ambulatory Visit: Payer: Self-pay | Admitting: Internal Medicine

## 2019-02-14 ENCOUNTER — Other Ambulatory Visit: Payer: Self-pay

## 2019-02-14 ENCOUNTER — Ambulatory Visit (INDEPENDENT_AMBULATORY_CARE_PROVIDER_SITE_OTHER): Payer: Medicare HMO | Admitting: Family Medicine

## 2019-02-14 DIAGNOSIS — J019 Acute sinusitis, unspecified: Secondary | ICD-10-CM

## 2019-02-14 DIAGNOSIS — B9689 Other specified bacterial agents as the cause of diseases classified elsewhere: Secondary | ICD-10-CM | POA: Diagnosis not present

## 2019-02-14 MED ORDER — AMOXICILLIN-POT CLAVULANATE 875-125 MG PO TABS: 1.0000 | ORAL_TABLET | Freq: Two times a day (BID) | ORAL | 0 refills | Status: DC

## 2019-02-14 NOTE — Progress Notes (Signed)
Subjective:    Patient ID: Yolanda Gallegos, female    DOB: [birth year], 58 y.o.   MRN: [id number]  HPI Patient is being seen today via telephone visit.  She consents to a telephone visit.  She is currently at home.  I am currently my office.  Telephone call began at 2 10 in the afternoon.  Visit ended at 220 in the afternoon.  Patient states that she has had 2 weeks of sinus pressure.  She states that both of her maxillary sinuses are hurting and are painful.  She also has pain in her right ear.  She reports head congestion.  She reports rhinorrhea.  Whenever she blows her nose, it is thick and green and purulent in appearance.  The symptoms are worse on her right side.  She is having a dull constant headache on the right side.  She is taking Zyrtec and Flonase every day without any relief.  She denies any cough.  She denies any shortness of breath.  She denies any chest pain.  She denies any recent travel. Past Medical History:  Diagnosis Date  . Allergy   . Altered gait    USES CANE TO JUDGE DISTANCE DUE TO BLINDNESS IN LEFT EYE  . Colon polyps 2015  . COPD (chronic obstructive pulmonary disease) ([place])   . Diabetes mellitus without complication ([place])   . Emphysema of lung ([place])   . GERD (gastroesophageal reflux disease)   . Hyperlipidemia   . Hypertension   . Legally blind in left eye, as defined in [place] 2009  . Meningioma ([place]) 2009   on left side brain behind left eye/ bliindness left eye  . Multinodular goiter    subclinical hyperthyroidism  . Smoker   . Subclinical hyperthyroidism    due to multinodular goiter.  Confirmed on uptak escan 2015  . Tubular adenoma 2015   Past Surgical History:  Procedure Laterality Date  . ABDOMINAL HYSTERECTOMY  1999   left oopherectomy  . 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 BREAST BIOPSY Left 2007   benign  . [place]   3 times  . INNER EAR SURGERY Right 2001  . LEFT HEART CATH AND  CORONARY ANGIOGRAPHY N/A 04/22/2017   Procedure: Left Heart Cath and Coronary Angiography;  Surgeon: Nelva Bush, MD;  Location: [facility];  Service: Cardiovascular;  Laterality: N/A;   Current Outpatient Medications on File Prior to Visit  Medication Sig Dispense Refill  . ACCU-CHEK AVIVA PLUS test strip TEST BLOOD SUGAR TWICE DAILY TO THREE TIMES DAILY 300 each 4  . ACCU-CHEK SOFTCLIX LANCETS lancets USE AS INSTRUCTED 300 each 4  . acetaminophen (TYLENOL) 500 MG tablet Take 1,000 mg by mouth 3 (three) times daily as needed for moderate pain or headache.     . Alcohol Swabs (B-D SINGLE USE SWABS REGULAR) PADS USE AS DIRECTED AS NEEDED FOR FINGERSTICK AND INSULIN INJECTION 300 each 4  . amLODipine (NORVASC) 5 MG tablet TAKE 1 TABLET EVERY DAY 90 tablet 2  . aspirin EC 81 MG tablet Take 1 tablet (81 mg total) by mouth daily. 90 tablet 3  . atorvastatin (LIPITOR) 80 MG tablet Take 1 tablet (80 mg total) by mouth daily. 90 tablet 3  . Blood Glucose Monitoring Suppl (ACCU-CHEK AVIVA PLUS) w/Device KIT TEST BLOOD SUGAR TWICE DAILY TO THREE TIMES DAILY 1 kit 0  . Calcium Citrate-Vitamin D (CALCIUM CITRATE + D  PO) Take 500 Units by mouth daily.    . Dulaglutide (TRULICITY) 9.02 IO/9.7DZ SOPN Inject SQ weekly (Patient taking differently: Inject 0.75 mg as directed every Monday. ) 12 pen 4  . fluticasone (FLONASE) 50 MCG/ACT nasal spray USE 2 SPRAYS IN EACH NOSTRIL EVERY DAY 48 g 3  . hydrocortisone cream 1 % Apply 1 application topically 2 (two) times daily. 270 g 3  . levothyroxine (SYNTHROID, LEVOTHROID) 100 MCG tablet TAKE 1 TABLET EVERY DAY BEFORE BREAKFAST 90 tablet 3  . losartan (COZAAR) 100 MG tablet TAKE 1 TABLET EVERY DAY 90 tablet 3  . Magnesium 400 MG TABS Take by mouth daily.    . Magnesium Oxide 400 MG CAPS Take 1 capsule (400 mg total) by mouth 2 (two) times daily. 180 each 3  . metoprolol succinate (TOPROL-XL) 50 MG 24 hr tablet TAKE 1 TABLET TWICE DAILY 180 tablet 1  .  omeprazole (PRILOSEC) 20 MG capsule TAKE 1 CAPSULE EVERY DAY 90 capsule 3  . potassium chloride SA (K-DUR,KLOR-CON) 20 MEQ tablet TAKE 2 TABLETS EVERY DAY 180 tablet 0  . terbinafine (LAMISIL) 1 % cream Apply 1 application topically daily as needed (athletes foot).    . vitamin E 400 UNIT capsule Take 400 Units by mouth daily.    Marland Kitchen ZYRTEC ALLERGY 10 MG tablet Take 10 mg by mouth daily.      Current Facility-Administered Medications on File Prior to Visit  Medication Dose Route Frequency Provider Last Rate Last Dose  . 0.9 %  sodium chloride infusion  500 mL Intravenous Once Pyrtle, Lajuan Lines, MD       Allergies  Allergen Reactions  . Doxycycline Hives and Shortness Of Breath  . Mobic [Meloxicam]     "feeling of paralysis"  . Glyxambi [Empagliflozin-Linagliptin] Itching  . Sulfa Antibiotics     Per positive allergy test   Social History   Socioeconomic History  . Marital status: Divorced    Spouse name: Not on file  . Number of children: Not on file  . Years of education: Not on file  . Highest education level: Not on file  Occupational History  . Not on file  Social Needs  . Financial resource strain: Not on file  . Food insecurity:    Worry: Not on file    Inability: Not on file  . Transportation needs:    Medical: Not on file    Non-medical: Not on file  Tobacco Use  . Smoking status: Former Smoker    Packs/day: 1.50    Years: 30.00    Pack years: 45.00    Types: Cigarettes    Last attempt to quit: 2013    Years since quitting: 7.2  . Smokeless tobacco: Never Used  Substance and Sexual Activity  . Alcohol use: No  . Drug use: No  . Sexual activity: Not on file    Comment: married  Lifestyle  . Physical activity:    Days per week: Not on file    Minutes per session: Not on file  . Stress: Not on file  Relationships  . Social connections:    Talks on phone: Not on file    Gets together: Not on file    Attends religious service: Not on file    Active member of  club or organization: Not on file    Attends meetings of clubs or organizations: Not on file    Relationship status: Not on file  . Intimate partner violence:  Fear of current or ex partner: Not on file    Emotionally abused: Not on file    Physically abused: Not on file    Forced sexual activity: Not on file  Other Topics Concern  . Not on file  Social History Narrative  . Not on file      Review of Systems  All other systems reviewed and are negative.      Objective:   Physical Exam  No physical exam was performed due to the fact this is a telephone visit      Assessment & Plan:  Acute bacterial rhinosinusitis  Patient sounds to have developed a sinus infection.  Symptoms have been going on now for more than 14 days.  I will treat the patient with Augmentin 875 mg p.o. twice daily for 10 days.  She is instructed to continue Flonase as well as Zyrtec.  Also recommended trying nasal saline to help flush her sinus passages.  She can use ibuprofen as needed for her headache.  Recheck if no better in 1 week or sooner if worsening.

## 2019-02-19 ENCOUNTER — Other Ambulatory Visit: Payer: Self-pay | Admitting: Family Medicine

## 2019-05-11 ENCOUNTER — Other Ambulatory Visit: Payer: Self-pay | Admitting: Family Medicine

## 2019-05-11 ENCOUNTER — Other Ambulatory Visit: Payer: Self-pay | Admitting: Internal Medicine

## 2019-05-11 DIAGNOSIS — I10 Essential (primary) hypertension: Secondary | ICD-10-CM

## 2019-08-06 ENCOUNTER — Other Ambulatory Visit: Payer: Self-pay | Admitting: Family Medicine

## 2019-08-06 ENCOUNTER — Other Ambulatory Visit: Payer: Self-pay

## 2019-08-06 DIAGNOSIS — I10 Essential (primary) hypertension: Secondary | ICD-10-CM

## 2019-08-06 MED ORDER — ATORVASTATIN CALCIUM 80 MG PO TABS: ORAL_TABLET | ORAL | 0 refills | Status: DC

## 2019-08-06 MED ORDER — FLUTICASONE PROPIONATE 50 MCG/ACT NA SUSP: 2.0000 | Freq: Every day | NASAL | 0 refills | Status: DC

## 2019-08-06 MED ORDER — AMLODIPINE BESYLATE 5 MG PO TABS: 5.0000 mg | ORAL_TABLET | Freq: Every day | ORAL | 0 refills | Status: DC

## 2019-09-10 ENCOUNTER — Other Ambulatory Visit: Payer: Medicare HMO

## 2019-09-17 ENCOUNTER — Ambulatory Visit (INDEPENDENT_AMBULATORY_CARE_PROVIDER_SITE_OTHER): Payer: Medicare HMO | Admitting: Family Medicine

## 2019-09-17 ENCOUNTER — Encounter: Payer: Self-pay | Admitting: Family Medicine

## 2019-09-17 ENCOUNTER — Other Ambulatory Visit: Payer: Self-pay

## 2019-09-17 VITALS — BP 128/66 | HR 72 | Temp 98.4°F | Resp 16 | Ht 66.0 in | Wt 253.0 lb

## 2019-09-17 DIAGNOSIS — I10 Essential (primary) hypertension: Secondary | ICD-10-CM | POA: Diagnosis not present

## 2019-09-17 DIAGNOSIS — Z0000 Encounter for general adult medical examination without abnormal findings: Secondary | ICD-10-CM | POA: Diagnosis not present

## 2019-09-17 DIAGNOSIS — E039 Hypothyroidism, unspecified: Secondary | ICD-10-CM | POA: Diagnosis not present

## 2019-09-17 DIAGNOSIS — F17200 Nicotine dependence, unspecified, uncomplicated: Secondary | ICD-10-CM

## 2019-09-17 DIAGNOSIS — Z122 Encounter for screening for malignant neoplasm of respiratory organs: Secondary | ICD-10-CM

## 2019-09-17 DIAGNOSIS — R7309 Other abnormal glucose: Secondary | ICD-10-CM | POA: Diagnosis not present

## 2019-09-17 DIAGNOSIS — E119 Type 2 diabetes mellitus without complications: Secondary | ICD-10-CM | POA: Diagnosis not present

## 2019-09-17 DIAGNOSIS — E118 Type 2 diabetes mellitus with unspecified complications: Secondary | ICD-10-CM

## 2019-09-17 DIAGNOSIS — Z23 Encounter for immunization: Secondary | ICD-10-CM

## 2019-09-17 DIAGNOSIS — E7800 Pure hypercholesterolemia, unspecified: Secondary | ICD-10-CM | POA: Diagnosis not present

## 2019-09-17 MED ORDER — ALPRAZOLAM 0.5 MG PO TABS: 0.5000 mg | ORAL_TABLET | Freq: Three times a day (TID) | ORAL | 0 refills | Status: DC | PRN

## 2019-09-17 MED ORDER — AMOXICILLIN 875 MG PO TABS: 875.0000 mg | ORAL_TABLET | Freq: Two times a day (BID) | ORAL | 0 refills | Status: DC

## 2019-09-17 NOTE — Progress Notes (Signed)
Subjective:    Patient ID: Yolanda Gallegos, female    DOB: 1961-09-20, 58 y.o.   MRN: [id number]  HPI Patient is here today for complete physical exam.  My heart goes out to this patient.  Her son was admitted to the hospital in 2018.  He now lives at home with her however she provides all the care for him.  He is on a trach/vent he is oxygen dependent.  Since I last saw her, her husband has been diagnosed with pancreatic cancer.  He died from pancreatic cancer in September.  Patient is dealing with this loss.  She states that she is slowly starting to do better.  She is starting to sleep better now.  She denies any depression however she is dealing with profound grief.  She also states that over the last week she has developed pain and pressure in both maxillary sinuses.  She complains of pain in her upper teeth.  She has been dealing with head congestion.  She believes she has a sinus infection.  Today on exam she is tender to percussion in both maxillary sinuses regarding her immunization she is due today for a flu shot.  She has had Pneumovax 23.  She has had Prevnar 13.  She is also due for Shingrix for shingles.  Her last mammogram was in November of last year.  She is due this November.  She prefers to schedule this for herself.  She has a history of a hysterectomy and a left oophorectomy.  She still has her right ovary however she does not require cervical cancer screening.  Her last colonoscopy was performed in 2019.  There were several polyps found and they recommended a repeat colonoscopy in 5 years.  Patient quit smoking 7 years ago.  However she states that she has been smoking since she was 58 years old.  She quit at age 12.  Therefore she smoked for 35 years.  She averaged a pack a day during that time.  Therefore she has a 35-pack-year history of smoking.  She has been quit now for approximately 6 years however she was still qualify for lung cancer screening.  We discussed this and the patient  would like to proceed with lung cancer screening particular given the recent loss of her husband. Past Medical History:  Diagnosis Date   Allergy    Altered gait    USES CANE TO JUDGE DISTANCE DUE TO BLINDNESS IN LEFT EYE   Colon polyps 2015   COPD (chronic obstructive pulmonary disease) ([place])    Diabetes mellitus without complication (H[facility])    Emphysema of lung ([facility])    GERD (gastroesophageal reflux disease)    Hyperlipidemia    Hypertension    Legally blind in left eye, as defined in [place] 2009   Meningioma ([place]) 2009   on left side brain behind left eye/ bliindness left eye   Multinodular goiter    subclinical hyperthyroidism   Smoker    Subclinical hyperthyroidism    due to multinodular goiter.  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 (SCHEDULE APPOINTMENT FOR FUTURE REFILLS) 300 strip 0   Accu-Chek Softclix Lancets lancets USE AS INSTRUCTED 300 each 0   acetaminophen (TYLENOL) 500 MG tablet Take 1,000 mg by mouth 3 (three) times daily as needed for moderate pain or headache.      Alcohol Swabs (B-D SINGLE USE SWABS REGULAR) PADS USE AS DIRECTED AS NEEDED FOR FINGERSTICK AND INSULIN INJECTION (PATIENT NEEDS APPOINTMENT FOR FUTURE REFILLS) 300 each 0   amLODipine (NORVASC) 5  MG tablet Take 1 tablet (5 mg total) by mouth daily. 90 tablet 0   aspirin EC 81 MG tablet Take 1 tablet (81 mg total) by mouth daily. 90 tablet 3   atorvastatin (LIPITOR) 80 MG tablet TAKE 1 TABLET (80 MG TOTAL) BY MOUTH DAILY. (DISCONTINUE ATORVASTATIN 40MG) 90 tablet 0   Blood Glucose Monitoring Suppl (ACCU-CHEK AVIVA PLUS) w/Device KIT TEST BLOOD SUGAR TWICE DAILY TO THREE TIMES DAILY 1 kit 0   Calcium Citrate-Vitamin D (CALCIUM CITRATE + D PO) Take 500 Units by mouth daily.     fluticasone (FLONASE) 50 MCG/ACT nasal spray Place 2 sprays into both nostrils daily. 48 g 0   levothyroxine (SYNTHROID, LEVOTHROID) 100 MCG tablet TAKE 1 TABLET EVERY DAY BEFORE BREAKFAST 90 tablet 3   losartan (COZAAR) 100 MG tablet TAKE 1 TABLET EVERY DAY 90 tablet 3   Magnesium 400 MG TABS Take by mouth daily.     Magnesium Oxide 400 MG CAPS Take 1 capsule (400 mg total) by mouth 2 (two) times daily. 180 each 3   metoprolol succinate (TOPROL-XL) 50 MG 24 hr tablet TAKE 1 TABLET TWICE DAILY 180 tablet 1   omeprazole (PRILOSEC) 20 MG capsule TAKE 1 CAPSULE EVERY DAY 90 capsule 3   potassium chloride SA (K-DUR) 20 MEQ tablet TAKE 2 TABLETS EVERY DAY 180 tablet 1   vitamin E 400 UNIT capsule Take 400 Units by mouth daily.     ZYRTEC ALLERGY 10 MG tablet Take 10 mg by mouth daily.      Dulaglutide (TRULICITY) 7.37 TG/6.2IR SOPN Inject SQ weekly (Patient not taking: Reported on 09/17/2019) 12 pen 4   Current Facility-Administered Medications on File Prior to Visit  Medication Dose Route Frequency Provider Last Rate Last Dose   0.9 %  sodium chloride infusion  500 mL Intravenous Once Pyrtle, Lajuan Lines, MD       Allergies  Allergen Reactions   Doxycycline Hives and Shortness Of Breath   Mobic [Meloxicam]     "feeling of paralysis"   Glyxambi [Empagliflozin-Linagliptin] Itching   Sulfa Antibiotics     Per positive allergy test   Social History   Socioeconomic History   Marital status: Divorced     Spouse name: Not on file   Number of children: Not on file   Years of education: Not on file   Highest education level: Not on file  Occupational History   Not on file  Social Needs   Financial resource strain: Not on file   Food insecurity    Worry: Not on file    Inability: Not on file   Transportation needs    Medical: Not on file  Non-medical: Not on file  Tobacco Use   Smoking status: Former Smoker    Packs/day: 1.50    Years: 30.00    Pack years: 45.00    Types: Cigarettes    Quit date: 2013    Years since quitting: 7.8   Smokeless tobacco: Never Used  Substance and Sexual Activity   Alcohol use: No   Drug use: No   Sexual activity: Not on file    Comment: married  Lifestyle   Physical activity    Days per week: Not on file    Minutes per session: Not on file   Stress: Not on file  Relationships   Social connections    Talks on phone: Not on file    Gets together: Not on file    Attends religious service: Not on file    Active member of club or organization: Not on file    Attends meetings of clubs or organizations: Not on file    Relationship status: Not on file   Intimate partner violence    Fear of current or ex partner: Not on file    Emotionally abused: Not on file    Physically abused: Not on file    Forced sexual activity: Not on file  Other Topics Concern   Not on file  Social History Narrative   Not on file   Family History  Problem Relation Age of Onset   Hypertension Mother    Cancer Mother        cervical   Cancer Father        leukemia   Hyperlipidemia Sister    Hyperlipidemia Brother    Heart attack Brother 60   Stroke Brother    Leukemia Son    Colon cancer Neg Hx    Esophageal cancer Neg Hx    Rectal cancer Neg Hx    Stomach cancer Neg Hx    Thyroid disease Neg Hx       Review of Systems  All other systems reviewed and are negative.      Objective:   Physical Exam  Constitutional:  She is oriented to person, place, and time. She appears well-developed and well-nourished. No distress.  HENT:  Head: Normocephalic and atraumatic.  Right Ear: External ear normal.  Left Ear: External ear normal.  Nose: Mucosal edema present. Right sinus exhibits maxillary sinus tenderness. Left sinus exhibits maxillary sinus tenderness.  Mouth/Throat: Oropharynx is clear and moist. No oropharyngeal exudate.  Eyes: Conjunctivae are normal. Right eye exhibits no discharge. Left eye exhibits no discharge. No scleral icterus.  Neck: Normal range of motion. Neck supple. No JVD present. No tracheal deviation present. No thyromegaly present.  Cardiovascular: Normal rate, regular rhythm, normal heart sounds and intact distal pulses. Exam reveals no gallop and no friction rub.  No murmur heard. Pulmonary/Chest: Effort normal and breath sounds normal. No stridor. No respiratory distress. She has no wheezes. She has no rales. She exhibits no tenderness.  Abdominal: Soft. Bowel sounds are normal. She exhibits no distension and no mass. There is no abdominal tenderness. There is no rebound and no guarding.  Musculoskeletal: Normal range of motion.        General: No tenderness, deformity or edema.  Lymphadenopathy:    She has no cervical adenopathy.  Neurological: She is alert and oriented to person, place, and time. She has normal reflexes. No cranial nerve deficit. She exhibits normal muscle tone. Coordination normal.  Skin: Skin is warm. No rash noted. She  is not diaphoretic. No erythema. No pallor.  Psychiatric: She has a normal mood and affect. Her behavior is normal. Judgment and thought content normal.  Vitals reviewed.    Patient is blind in her left eye.  Pupils are equal round reactive in right eye.      Assessment & Plan:  General medical exam  Essential hypertension  Pure hypercholesterolemia  Smoker  Hypothyroidism, unspecified type - Plan: Hemoglobin A1c, COMPLETE METABOLIC PANEL  WITH GFR, CBC with Differential/Platelet, Lipid panel, TSH  Controlled type 2 diabetes mellitus with complication, without long-term current use of insulin (HCC)  Encounter for screening for malignant neoplasm of respiratory organs - Plan: CT CHEST LUNG CA SCREEN LOW DOSE W/O CM  Patient appears to be dealing with a sinus infection.  I will treat her with amoxicillin 875 mg p.o. twice daily for 10 days.  I will give the patient Xanax 0.5 mg tablets 1 tablet every 8 hours as needed for anxiety or insomnia as she deals with loss of her husband.  Patient's blood pressure today is well controlled at 128/66.  However she is overdue for fasting lipid panel.  Given her history of diabetes her goal LDL cholesterol is less than 100.  I would also recommend checking a hemoglobin A1c.  Her goal hemoglobin A1c is less than 6.5.  Diabetic foot exam was performed today and is normal.  Blood pressure is at goal.  Regarding her hypothyroidism I will check a TSH.  She does endorse some fatigue however I believe a lot of this could just be due to stress.  I will schedule the patient for a CT of the chest to evaluate for lung cancer given her smoking history.  I recommended that the patient schedule herself for a mammogram.  Colonoscopy is up-to-date and Pap smear is not required.

## 2019-09-17 NOTE — Addendum Note (Signed)
Addended by: Shary Decamp B on: 09/17/2019 03:44 PM   Modules accepted: Orders

## 2019-09-18 LAB — LIPID PANEL
Cholesterol: 164 mg/dL (ref <200)
HDL: 43 mg/dL — ABNORMAL LOW (ref >=50)
LDL Cholesterol (Calc): 91 mg/dL (calc)
Non-HDL Cholesterol (Calc): 121 mg/dL (calc) (ref <130)
Total CHOL/HDL Ratio: 3.8 (calc) (ref <5.0)
Triglycerides: 209 mg/dL — ABNORMAL HIGH (ref <150)

## 2019-09-18 LAB — COMPLETE METABOLIC PANEL WITH GFR
AG Ratio: 1.8 (calc) (ref 1.0–2.5)
ALT: 22 U/L (ref 6–29)
AST: 18 U/L (ref 10–35)
Albumin: 4.5 g/dL (ref 3.6–5.1)
Alkaline phosphatase (APISO): 75 U/L (ref 37–153)
BUN: 9 mg/dL (ref 7–25)
CO2: 28 mmol/L (ref 20–32)
Calcium: 10.3 mg/dL (ref 8.6–10.4)
Chloride: 105 mmol/L (ref 98–110)
Creat: 0.75 mg/dL (ref 0.50–1.05)
GFR, Est African American: 103 mL/min/{1.73_m2} (ref >=60)
GFR, Est Non African American: 89 mL/min/{1.73_m2} (ref >=60)
Globulin: 2.5 g/dL (calc) (ref 1.9–3.7)
Glucose, Bld: 100 mg/dL — ABNORMAL HIGH (ref 65–99)
Potassium: 4 mmol/L (ref 3.5–5.3)
Sodium: 144 mmol/L (ref 135–146)
Total Bilirubin: 0.7 mg/dL (ref 0.2–1.2)
Total Protein: 7 g/dL (ref 6.1–8.1)

## 2019-09-18 LAB — TSH: TSH: 1.19 mIU/L (ref 0.40–4.50)

## 2019-09-18 LAB — CBC WITH DIFFERENTIAL/PLATELET
Absolute Monocytes: 745 cells/uL (ref 200–950)
Basophils Absolute: 37 cells/uL (ref 0–200)
Basophils Relative: 0.4 %
Eosinophils Absolute: 193 cells/uL (ref 15–500)
Eosinophils Relative: 2.1 %
HCT: 41.1 % (ref 35.0–45.0)
Hemoglobin: 13.7 g/dL (ref 11.7–15.5)
Lymphs Abs: 3165 cells/uL (ref 850–3900)
MCH: 29.9 pg (ref 27.0–33.0)
MCHC: 33.3 g/dL (ref 32.0–36.0)
MCV: 89.7 fL (ref 80.0–100.0)
MPV: 10.4 fL (ref 7.5–12.5)
Monocytes Relative: 8.1 %
Neutro Abs: 5060 cells/uL (ref 1500–7800)
Neutrophils Relative %: 55 %
Platelets: 321 10*3/uL (ref 140–400)
RBC: 4.58 10*6/uL (ref 3.80–5.10)
RDW: 12.6 % (ref 11.0–15.0)
Total Lymphocyte: 34.4 %
WBC: 9.2 10*3/uL (ref 3.8–10.8)

## 2019-09-18 LAB — HEMOGLOBIN A1C
Hgb A1c MFr Bld: 6.1 % of total Hgb — ABNORMAL HIGH (ref <5.7)
Mean Plasma Glucose: 128 (calc)
eAG (mmol/L): 7.1 (calc)

## 2019-09-26 ENCOUNTER — Other Ambulatory Visit: Payer: Self-pay | Admitting: Family Medicine

## 2019-09-26 DIAGNOSIS — Z1231 Encounter for screening mammogram for malignant neoplasm of breast: Secondary | ICD-10-CM

## 2019-09-28 ENCOUNTER — Ambulatory Visit (INDEPENDENT_AMBULATORY_CARE_PROVIDER_SITE_OTHER): Payer: Medicare HMO | Admitting: Internal Medicine

## 2019-09-28 ENCOUNTER — Other Ambulatory Visit: Payer: Self-pay

## 2019-09-28 ENCOUNTER — Encounter: Payer: Self-pay | Admitting: Internal Medicine

## 2019-09-28 VITALS — BP 110/78 | HR 68 | Ht 66.0 in | Wt 255.0 lb

## 2019-09-28 DIAGNOSIS — R002 Palpitations: Secondary | ICD-10-CM

## 2019-09-28 DIAGNOSIS — I472 Ventricular tachycardia: Secondary | ICD-10-CM | POA: Diagnosis not present

## 2019-09-28 DIAGNOSIS — K219 Gastro-esophageal reflux disease without esophagitis: Secondary | ICD-10-CM

## 2019-09-28 DIAGNOSIS — I4729 Other ventricular tachycardia: Secondary | ICD-10-CM

## 2019-09-28 DIAGNOSIS — I10 Essential (primary) hypertension: Secondary | ICD-10-CM

## 2019-09-28 DIAGNOSIS — I471 Supraventricular tachycardia: Secondary | ICD-10-CM

## 2019-09-28 NOTE — Patient Instructions (Signed)
Medication Instructions:  Your physician recommends that you continue on your current medications as directed. Please refer to the Current Medication list given to you today.  *If you need a refill on your cardiac medications before your next appointment, please call your pharmacy*  Lab Work: none If you have labs (blood work) drawn today and your tests are completely normal, you will receive your results only by: Marland Kitchen MyChart Message (if you have MyChart) OR . A paper copy in the mail If you have any lab test that is abnormal or we need to change your treatment, we will call you to review the results.  Testing/Procedures: none  Follow-Up: At [facility], you and your health needs are our priority.  As part of our continuing mission to provide you with exceptional heart care, we have created designated Provider Care Teams.  These Care Teams include your primary Cardiologist (physician) and Advanced Practice Providers (APPs -  Physician Assistants and Nurse Practitioners) who all work together to provide you with the care you need, when you need it.  Your next appointment:   12 months  The format for your next appointment:   In Person  Provider:    You may see DR Harrell Gave END or one of the following Advanced Practice Providers on your designated Care Team:    Murray Hodgkins, NP  Christell Faith, PA-C  Marrianne Mood, PA-C

## 2019-09-28 NOTE — Progress Notes (Signed)
Follow-up Outpatient Visit Date: 09/28/2019  Primary Care Provider: [name], MD [address] [place] [address]  Chief Complaint: Follow-up palpitations/NSVT  HPI:  Yolanda Gallegos is a 58 y.o. year-old female with history of nonsustained ventricular tachycardia in the setting of hypomagnesemia, diabetes mellitus, hypertension, hyperlipidemia, subclinical hyperthyroidism, and GERD, who presents for follow-up of palpitations.  I last saw Yolanda Gallegos a year ago, at which time she was doing well with only rare palpitations.  She had been restarted on omeprazole due to suboptimal control of her GERD on famotidine.  Of note, hypomagnesemia was felt to be due to long-term PPI use.  We agreed to increase magnesium oxide to 400 mg twice daily and continue metoprolol succinate 50 mg twice daily.  Today, Yolanda Gallegos reports feeling well.  Her husband passed away from pancreatic cancer in September, at which time Yolanda Gallegos experienced temporary recrudescence of brief flutters.  This has since abated.  She denies chest pain, shortness of breath, lightheadedness, and edema.  Her GERD is well-controlled with omeprazole 20 mg daily.  --------------------------------------------------------------------------------------------------  Cardiovascular History & Procedures: Cardiovascular Problems:  Palpitationswith NSVT  Atypical chest pain  Risk Factors:  Diabetes mellitus, hypertension, hyperlipidemia, obesity, and history of tobacco use  Cath/PCI:  LHC (04/22/17): No angiographically significant coronary artery disease. LVEF 50-55% with upper normal to mildly elevated filling pressure.  CV Surgery:  None  EP Procedures and Devices:  14-day event monitor (02/17/17): Predominantly sinus rhythm with rare PSVT and NSVT, as well as rare PACs and PVCs.  Non-Invasive Evaluation(s):  Echo (03/22/2017): Normal LV size and wall thickness.  LVEF 55-60% with normal wall motion.  Mild left  atrial enlargement.  Normal RV size and function.  Recent CV Pertinent Labs: Lab Results  Component Value Date   CHOL 164 09/17/2019   HDL 43 (L) 09/17/2019   LDLCALC 91 09/17/2019   TRIG 209 (H) 09/17/2019   CHOLHDL 3.8 09/17/2019   INR 1.0 04/14/2017   K 4.0 09/17/2019   MG 1.7 08/08/2018   BUN 9 09/17/2019   BUN 12 04/14/2017   CREATININE 0.75 09/17/2019    Past medical and surgical history were reviewed and updated in EPIC.  Current Meds  Medication Sig  . ACCU-CHEK AVIVA PLUS test strip TEST BLOOD SUGAR TWICE DAILY TO THREE TIMES DAILY. (SCHEDULE APPOINTMENT FOR FUTURE REFILLS)  . Accu-Chek Softclix Lancets lancets USE AS INSTRUCTED  . acetaminophen (TYLENOL) 500 MG tablet Take 1,000 mg by mouth 3 (three) times daily as needed for moderate pain or headache.   . Alcohol Swabs (B-D SINGLE USE SWABS REGULAR) PADS USE AS DIRECTED AS NEEDED FOR FINGERSTICK AND INSULIN INJECTION (PATIENT NEEDS APPOINTMENT FOR FUTURE REFILLS)  . ALPRAZolam (XANAX) 0.5 MG tablet Take 1 tablet (0.5 mg total) by mouth 3 (three) times daily as needed for anxiety or sleep.  Marland Kitchen amLODipine (NORVASC) 5 MG tablet Take 1 tablet (5 mg total) by mouth daily.  Marland Kitchen aspirin EC 81 MG tablet Take 1 tablet (81 mg total) by mouth daily.  Marland Kitchen atorvastatin (LIPITOR) 80 MG tablet TAKE 1 TABLET (80 MG TOTAL) BY MOUTH DAILY. (DISCONTINUE ATORVASTATIN 40MG)  . Blood Glucose Monitoring Suppl (ACCU-CHEK AVIVA PLUS) w/Device KIT TEST BLOOD SUGAR TWICE DAILY TO THREE TIMES DAILY  . Calcium Citrate-Vitamin D (CALCIUM CITRATE + D PO) Take 500 Units by mouth daily.  . fluticasone (FLONASE) 50 MCG/ACT nasal spray Place 2 sprays into both nostrils daily.  Marland Kitchen levothyroxine (SYNTHROID, LEVOTHROID) 100  MCG tablet TAKE 1 TABLET EVERY DAY BEFORE BREAKFAST  . losartan (COZAAR) 100 MG tablet TAKE 1 TABLET EVERY DAY  . Magnesium 400 MG TABS Take by mouth daily.  . metoprolol succinate (TOPROL-XL) 50 MG 24 hr tablet TAKE 1 TABLET TWICE DAILY  .  omeprazole (PRILOSEC) 20 MG capsule TAKE 1 CAPSULE EVERY DAY  . potassium chloride SA (K-DUR) 20 MEQ tablet TAKE 2 TABLETS EVERY DAY  . vitamin E 400 UNIT capsule Take 400 Units by mouth daily.  Marland Kitchen ZYRTEC ALLERGY 10 MG tablet Take 10 mg by mouth daily.    Current Facility-Administered Medications for the 09/28/19 encounter (Office Visit) with Chancey Cullinane, Harrell Gave, MD  Medication  . 0.9 %  sodium chloride infusion    Allergies: Doxycycline, Mobic [meloxicam], Glyxambi [empagliflozin-linagliptin], and Sulfa antibiotics  Social History   Tobacco Use  . Smoking status: Former Smoker    Packs/day: 1.50    Years: 30.00    Pack years: 45.00    Types: Cigarettes    Quit date: 2013    Years since quitting: 7.8  . Smokeless tobacco: Never Used  Substance Use Topics  . Alcohol use: No  . Drug use: No    Family History  Problem Relation Age of Onset  . Hypertension Mother   . Cancer Mother        cervical  . Cancer Father        leukemia  . Hyperlipidemia Sister   . Hyperlipidemia Brother   . Heart attack Brother 57  . Stroke Brother   . Leukemia Son   . Colon cancer Neg Hx   . Esophageal cancer Neg Hx   . Rectal cancer Neg Hx   . Stomach cancer Neg Hx   . Thyroid disease Neg Hx     Review of Systems: A 12-system review of systems was performed and was negative except as noted in the HPI.  --------------------------------------------------------------------------------------------------  Physical Exam: BP 110/78 (BP Location: Left Arm, Patient Position: Sitting, Cuff Size: Normal)   Pulse 68   Ht _0  (1.676 m)   Wt 255 lb (115.7 kg)   SpO2 98%   BMI 41.16 kg/m   General:  NAD. HEENT: No conjunctival pallor or scleral icterus. Facemask in place. Neck: Supple without lymphadenopathy, thyromegaly, JVD, or HJR. Lungs: Normal work of breathing. Clear to auscultation bilaterally without wheezes or crackles. Heart: Regular rate and rhythm without murmurs, rubs, or gallops.  Unable to assess PMI due to body habitus. Abd: Bowel sounds present. Soft, NT/ND.  Unable to assess HSM due to body habitus. Ext: No lower extremity edema. Radial, PT, and DP pulses are 2+ bilaterally. Skin: Warm and dry without rash.  EKG:  NSR with sinus arrhythmia.  No significant abnormality.  Lab Results  Component Value Date   WBC 9.2 09/17/2019   HGB 13.7 09/17/2019   HCT 41.1 09/17/2019   MCV 89.7 09/17/2019   PLT 321 09/17/2019    Lab Results  Component Value Date   NA 144 09/17/2019   K 4.0 09/17/2019   CL 105 09/17/2019   CO2 28 09/17/2019   BUN 9 09/17/2019   CREATININE 0.75 09/17/2019   GLUCOSE 100 (H) 09/17/2019   ALT 22 09/17/2019    Lab Results  Component Value Date   CHOL 164 09/17/2019   HDL 43 (L) 09/17/2019   LDLCALC 91 09/17/2019   TRIG 209 (H) 09/17/2019   CHOLHDL 3.8 09/17/2019    --------------------------------------------------------------------------------------------------  ASSESSMENT AND PLAN: Palpitations, PSVT, and  NSVT: Transient recurrence of palpitations noted in September around the time of Yolanda Gallegos's husband's death, which is not unexpected.  She has otherwise been asymptomatic.  Recent chemistry showed normal potassium; magnesium level was not checked.  We have agreed to continue current medications, including metoprolol succinate 50 mg BID and magnesium oxide 400 mg BID.  Hypertension: Blood pressure reasonably well-controlled today.  Continue current medications and follow-up with Dr. Dennard Schaumann.  GERD: Continue omeprazole 20 mg daily.  Nelva Bush, MD 09/28/2019 11:16 AM

## 2019-09-29 DIAGNOSIS — K219 Gastro-esophageal reflux disease without esophagitis: Secondary | ICD-10-CM | POA: Insufficient documentation

## 2019-10-02 ENCOUNTER — Ambulatory Visit: Payer: Medicare HMO

## 2019-10-03 ENCOUNTER — Ambulatory Visit: Payer: Medicare HMO

## 2019-10-09 ENCOUNTER — Telehealth: Payer: Self-pay | Admitting: Family Medicine

## 2019-10-09 MED ORDER — CEFDINIR 300 MG PO CAPS: 300.0000 mg | ORAL_CAPSULE | Freq: Two times a day (BID) | ORAL | 0 refills | Status: AC

## 2019-10-09 MED ORDER — TRIAMCINOLONE ACETONIDE 0.5 % EX CREA: 1.0000 "application " | TOPICAL_CREAM | Freq: Three times a day (TID) | CUTANEOUS | 1 refills | Status: AC

## 2019-10-09 MED ORDER — HYDROCORTISONE 2.5 % EX CREA: TOPICAL_CREAM | Freq: Two times a day (BID) | CUTANEOUS | 3 refills | Status: AC

## 2019-10-09 NOTE — Telephone Encounter (Signed)
Pt called and states that she finished the antibx for sinus and pred for her back and she is not completely clear and back is much better but still hurting, would like another round of both. Please advise.   CB# [phone]

## 2019-10-09 NOTE — Telephone Encounter (Signed)
Pt aware and med sent to pharm 

## 2019-10-09 NOTE — Telephone Encounter (Signed)
Would recommend switching Omnicef 300 mg p.o. twice daily for 10 days if she continues to have symptoms of a sinus infection.  Would not recommend repeated doses of prednisone due to the risk particular given her diabetes

## 2019-10-19 ENCOUNTER — Other Ambulatory Visit: Payer: Self-pay | Admitting: Family Medicine

## 2019-10-19 DIAGNOSIS — I10 Essential (primary) hypertension: Secondary | ICD-10-CM

## 2019-10-20 ENCOUNTER — Other Ambulatory Visit: Payer: Self-pay | Admitting: Family Medicine

## 2019-10-22 ENCOUNTER — Other Ambulatory Visit: Payer: Self-pay

## 2019-10-22 ENCOUNTER — Emergency Department (HOSPITAL_COMMUNITY)
Admission: EM | Admit: 2019-10-22 | Discharge: 2019-10-22 | Disposition: A | Discharge status: Home / Self Care | Payer: Medicare HMO | Attending: Emergency Medicine | Admitting: Emergency Medicine

## 2019-10-22 ENCOUNTER — Emergency Department (HOSPITAL_COMMUNITY): Payer: Medicare HMO

## 2019-10-22 ENCOUNTER — Encounter (HOSPITAL_COMMUNITY): Payer: Self-pay | Admitting: Emergency Medicine

## 2019-10-22 DIAGNOSIS — Z79899 Other long term (current) drug therapy: Secondary | ICD-10-CM | POA: Insufficient documentation

## 2019-10-22 DIAGNOSIS — J449 Chronic obstructive pulmonary disease, unspecified: Secondary | ICD-10-CM | POA: Diagnosis not present

## 2019-10-22 DIAGNOSIS — R000 Tachycardia, unspecified: Secondary | ICD-10-CM | POA: Diagnosis not present

## 2019-10-22 DIAGNOSIS — R5381 Other malaise: Secondary | ICD-10-CM | POA: Diagnosis not present

## 2019-10-22 DIAGNOSIS — Z87891 Personal history of nicotine dependence: Secondary | ICD-10-CM | POA: Insufficient documentation

## 2019-10-22 DIAGNOSIS — E119 Type 2 diabetes mellitus without complications: Secondary | ICD-10-CM | POA: Insufficient documentation

## 2019-10-22 DIAGNOSIS — R42 Dizziness and giddiness: Secondary | ICD-10-CM | POA: Diagnosis not present

## 2019-10-22 DIAGNOSIS — Z7982 Long term (current) use of aspirin: Secondary | ICD-10-CM | POA: Diagnosis not present

## 2019-10-22 DIAGNOSIS — I10 Essential (primary) hypertension: Secondary | ICD-10-CM | POA: Diagnosis not present

## 2019-10-22 DIAGNOSIS — K529 Noninfective gastroenteritis and colitis, unspecified: Secondary | ICD-10-CM | POA: Diagnosis not present

## 2019-10-22 DIAGNOSIS — K7689 Other specified diseases of liver: Secondary | ICD-10-CM | POA: Diagnosis not present

## 2019-10-22 DIAGNOSIS — K921 Melena: Secondary | ICD-10-CM | POA: Diagnosis not present

## 2019-10-22 LAB — COMPREHENSIVE METABOLIC PANEL
ALT: 19 U/L (ref 0–44)
AST: 16 U/L (ref 15–41)
Albumin: 3.9 g/dL (ref 3.5–5.0)
Alkaline Phosphatase: 73 U/L (ref 38–126)
Anion gap: 11 (ref 5–15)
BUN: 11 mg/dL (ref 6–20)
CO2: 27 mmol/L (ref 22–32)
Calcium: 9.3 mg/dL (ref 8.9–10.3)
Chloride: 103 mmol/L (ref 98–111)
Creatinine, Ser: 0.72 mg/dL (ref 0.44–1.00)
GFR calc Af Amer: >60 mL/min (ref >60)
GFR calc non Af Amer: >60 mL/min (ref >60)
Glucose, Bld: 117 mg/dL — ABNORMAL HIGH (ref 70–99)
Potassium: 3.3 mmol/L — ABNORMAL LOW (ref 3.5–5.1)
Sodium: 141 mmol/L (ref 135–145)
Total Bilirubin: 0.8 mg/dL (ref 0.3–1.2)
Total Protein: 7.3 g/dL (ref 6.5–8.1)

## 2019-10-22 LAB — CBC WITH DIFFERENTIAL/PLATELET
Abs Immature Granulocytes: 0.09 10*3/uL — ABNORMAL HIGH (ref 0.00–0.07)
Basophils Absolute: 0 10*3/uL (ref 0.0–0.1)
Basophils Relative: 0 %
Eosinophils Absolute: 0.4 10*3/uL (ref 0.0–0.5)
Eosinophils Relative: 4 %
HCT: 40.2 % (ref 36.0–46.0)
Hemoglobin: 12.8 g/dL (ref 12.0–15.0)
Immature Granulocytes: 1 %
Lymphocytes Relative: 17 %
Lymphs Abs: 1.8 10*3/uL (ref 0.7–4.0)
MCH: 30 pg (ref 26.0–34.0)
MCHC: 31.8 g/dL (ref 30.0–36.0)
MCV: 94.1 fL (ref 80.0–100.0)
Monocytes Absolute: 0.9 10*3/uL (ref 0.1–1.0)
Monocytes Relative: 8 %
Neutro Abs: 7.3 10*3/uL (ref 1.7–7.7)
Neutrophils Relative %: 70 %
Platelets: 270 10*3/uL (ref 150–400)
RBC: 4.27 MIL/uL (ref 3.87–5.11)
RDW: 13.2 % (ref 11.5–15.5)
WBC: 10.4 10*3/uL (ref 4.0–10.5)
nRBC: 0 % (ref 0.0–0.2)

## 2019-10-22 LAB — PROTIME-INR
INR: 1 (ref 0.8–1.2)
Prothrombin Time: 13.3 seconds (ref 11.4–15.2)

## 2019-10-22 LAB — TYPE AND SCREEN
ABO/RH(D): A NEG
Antibody Screen: NEGATIVE

## 2019-10-22 LAB — POC OCCULT BLOOD, ED: Fecal Occult Bld: POSITIVE — AB

## 2019-10-22 MED ORDER — IOHEXOL 300 MG/ML  SOLN
100.0000 mL | Freq: Once | INTRAMUSCULAR | Status: AC | PRN
  Administered 2019-10-22: 100 mL via INTRAVENOUS

## 2019-10-22 NOTE — Discharge Instructions (Signed)
Get help right away if you: Have a fever that does not go away with treatment. Develop chills. Have extreme weakness, fainting, or dehydration. Have repeated vomiting. Develop severe pain in your abdomen. Pass large clots Pass out Develop sob

## 2019-10-22 NOTE — ED Provider Notes (Signed)
[facility] EMERGENCY DEPARTMENT Provider Note   CSN: [id number] Arrival date & time: 10/22/19  1140     History   Chief Complaint Chief Complaint  Patient presents with  . Blood In Stools    HPI Yolanda Gallegos is a 58 y.o. female who presents for hematochezia. The patient had onset of diarrhea beginning 1 week ago. She had watery stools and tenesmus followed by passage of blood and clots. The patient states that it went away for a few days and 3 days ago returned. She has been passing  BRB and clots along with watery stools. She deneis fever, chills abdominal pain, nausea, vomiting. She denies foreign travel, ingestion of suspicious foods, hx of similar, contacts with similar. She feels light headed when standing. She denies sob. She takes a baby asa without other anticoagulation.     HPI  Past Medical History:  Diagnosis Date  . Allergy   . Altered gait    USES CANE TO JUDGE DISTANCE DUE TO BLINDNESS IN LEFT EYE  . Colon polyps 2015  . COPD (chronic obstructive pulmonary disease) ([place])   . Diabetes mellitus without complication ([place])   . Emphysema of lung ([place])   . GERD (gastroesophageal reflux disease)   . Hyperlipidemia   . Hypertension   . Legally blind in left eye, as defined in [place] 2009  . Meningioma ([place]) 2009   on left side brain behind left eye/ bliindness left eye  . Multinodular goiter    subclinical hyperthyroidism  . Smoker   . Subclinical hyperthyroidism    due to multinodular goiter.  Confirmed on uptak escan 2015  . Tubular adenoma 2015    Patient Active Problem List   Diagnosis Date Noted  . Gastroesophageal reflux disease 09/29/2019  . Palpitations 08/27/2017  . PSVT (paroxysmal supraventricular tachycardia) ([place]) 08/27/2017  . Hyperthyroidism 07/01/2017  . Atypical chest pain 04/22/2017  . NSVT (nonsustained ventricular tachycardia) ([place]) 04/22/2017  . Multinodular goiter   . Adenomatous colon polyp 02/11/2014  . Essential hypertension   .  Hyperlipidemia   . COPD (chronic obstructive pulmonary disease) ([place])   . Smoker     Past Surgical History:  Procedure Laterality Date  . ABDOMINAL HYSTERECTOMY  1999   left oopherectomy  . 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 BREAST BIOPSY Left 2007   benign  . [place]   3 times  . INNER EAR SURGERY Right 2001  . LEFT HEART CATH AND CORONARY ANGIOGRAPHY N/A 04/22/2017   Procedure: Left Heart Cath and Coronary Angiography;  Surgeon: Nelva Bush, MD;  Location: [place] [facility];  Service: Cardiovascular;  Laterality: N/A;     OB History    Gravida      Para      Term      Preterm      AB      Living  3     SAB      TAB      Ectopic      Multiple      Live Births               Home Medications    Prior to Admission medications   Medication Sig Start Date End Date Taking? Authorizing Provider  ACCU-CHEK AVIVA PLUS test strip TEST BLOOD SUGAR TWO TO THREE TIMES DAILY(SCHEDULE APPOINTMENT FOR FUTURE REFILLS) 10/22/19  Yes [name], MD  Accu-Chek Softclix  Lancets lancets USE AS INSTRUCTED 10/22/19  Yes [name], MD  acetaminophen (TYLENOL) 500 MG tablet Take 1,000 mg by mouth 3 (three) times daily as needed for moderate pain or headache.    Yes [provider]  Alcohol Swabs (B-D SINGLE USE SWABS REGULAR) PADS USE AS DIRECTED AS NEEDED FOR FINGERSTICK AND INSULIN INJECTION (NEEDS APPOINTMENT FOR FUTURE REFILLS) 10/22/19  Yes [name], MD  ALPRAZolam Duanne Moron) 0.5 MG tablet Take 1 tablet (0.5 mg total) by mouth 3 (three) times daily as needed for anxiety or sleep. 09/17/19  Yes [name], MD  amLODipine (NORVASC) 5 MG tablet TAKE 1 TABLET EVERY DAY Patient taking differently: Take 5 mg by mouth daily as needed.  10/22/19  Yes [name], MD  aspirin EC 81 MG tablet Take 1 tablet (81 mg total) by mouth daily. 03/23/17  Yes End, Harrell Gave, MD   atorvastatin (LIPITOR) 80 MG tablet TAKE 1 TABLET DAILY (DISCONTINUE ATORVASTATIN 40MG) Patient taking differently: Take 80 mg by mouth daily. TAKE 1 TABLET DAILY (DISCONTINUE ATORVASTATIN 40MG) 10/22/19  Yes [name], MD  Blood Glucose Monitoring Suppl (ACCU-CHEK AVIVA PLUS) w/Device KIT TEST BLOOD SUGAR TWICE DAILY TO THREE TIMES DAILY 05/12/17  Yes [name], MD  fluticasone (FLONASE) 50 MCG/ACT nasal spray USE 2 SPRAYS IN EACH NOSTRIL EVERY DAY Patient taking differently: Place 2 sprays into both nostrils daily.  10/22/19  Yes [name], MD  hydrocortisone 2.5 % cream Apply topically 2 (two) times daily. 10/09/19  Yes [name], MD  levothyroxine (SYNTHROID, LEVOTHROID) 100 MCG tablet TAKE 1 TABLET EVERY DAY BEFORE BREAKFAST Patient taking differently: Take 100 mcg by mouth daily before breakfast. TAKE 1 TABLET EVERY DAY BEFORE BREAKFAST 11/01/18  Yes [name], MD  losartan (COZAAR) 100 MG tablet TAKE 1 TABLET EVERY DAY Patient taking differently: Take 100 mg by mouth daily.  02/19/19  Yes [name], MD  Magnesium 400 MG TABS Take 400 mg by mouth 2 (two) times daily.    Yes [provider]  metoprolol succinate (TOPROL-XL) 50 MG 24 hr tablet TAKE 1 TABLET TWICE DAILY Patient taking differently: Take 50 mg by mouth 2 (two) times daily.  05/14/19  Yes End, Harrell Gave, MD  Multiple Vitamin (MULTIVITAMIN WITH MINERALS) TABS tablet Take 1 tablet by mouth daily.   Yes [provider]  omeprazole (PRILOSEC) 20 MG capsule TAKE 1 CAPSULE EVERY DAY Patient taking differently: Take 20 mg by mouth daily.  12/18/18  Yes [name], MD  potassium chloride SA (K-DUR) 20 MEQ tablet TAKE 2 TABLETS EVERY DAY Patient taking differently: Take 20 mEq by mouth daily.  05/14/19  Yes End, Harrell Gave, MD  triamcinolone cream (KENALOG) 0.5 % Apply 1 application topically 3 (three) times daily. 10/09/19  Yes [name], MD  ZYRTEC ALLERGY 10  MG tablet Take 10 mg by mouth daily.  07/13/15  Yes [provider]    Family History Family History  Problem Relation Age of Onset  . Hypertension Mother   . Cancer Mother        cervical  . Cancer Father        leukemia  . Hyperlipidemia Sister   . Hyperlipidemia Brother   . Heart attack Brother 86  . Stroke Brother   . Leukemia Son   . Colon cancer Neg Hx   . Esophageal cancer Neg Hx   . Rectal cancer Neg Hx   . Stomach cancer  Neg Hx   . Thyroid disease Neg Hx     Social History Social History   Tobacco Use  . Smoking status: Former Smoker    Packs/day: 1.50    Years: 30.00    Pack years: 45.00    Types: Cigarettes    Quit date: 2013    Years since quitting: 7.9  . Smokeless tobacco: Never Used  Substance Use Topics  . Alcohol use: Yes    Alcohol/week: 1.0 standard drinks    Types: 1 Glasses of wine per week    Comment: one nightly cocktail  . Drug use: No     Allergies   Doxycycline, Mobic [meloxicam], Glyxambi [empagliflozin-linagliptin], and Sulfa antibiotics   Review of Systems Review of Systems Ten systems reviewed and are negative for acute change, except as noted in the HPI.    Physical Exam Updated Vital Signs BP 117/64   Pulse (!) 102   Temp 99.9 F (37.7 C) (Oral)   Resp 18   Ht '5\' 6"'  (1.676 m)   Wt 112 kg   SpO2 92%   BMI 39.87 kg/m   Physical Exam Vitals signs and nursing note reviewed.  Constitutional:      General: She is not in acute distress.    Appearance: She is well-developed. She is not diaphoretic.  HENT:     Head: Normocephalic and atraumatic.  Eyes:     General: No scleral icterus.    Conjunctiva/sclera: Conjunctivae normal.  Neck:     Musculoskeletal: Normal range of motion.  Cardiovascular:     Rate and Rhythm: Normal rate and regular rhythm.     Heart sounds: Normal heart sounds. No murmur. No friction rub. No gallop.   Pulmonary:     Effort: Pulmonary effort is normal. No respiratory distress.      Breath sounds: Normal breath sounds.  Abdominal:     General: Bowel sounds are normal. There is no distension.     Palpations: Abdomen is soft. There is no mass.     Tenderness: There is no abdominal tenderness. There is no guarding.  Genitourinary:    Comments: Digital Rectal Exam reveals sphincter with good tone. No external hemorrhoids. No masses or fissures. Minimal mucoid and blood tinged stool on the examining finger. Skin:    General: Skin is warm and dry.  Neurological:     Mental Status: She is alert and oriented to person, place, and time.  Psychiatric:        Behavior: Behavior normal.      ED Treatments / Results  Labs (all labs ordered are listed, but only abnormal results are displayed) Labs Reviewed  COMPREHENSIVE METABOLIC PANEL - Abnormal; Notable for the following components:      Result Value   Potassium 3.3 (*)    Glucose, Bld 117 (*)    All other components within normal limits  CBC WITH DIFFERENTIAL/PLATELET - Abnormal; Notable for the following components:   Abs Immature Granulocytes 0.09 (*)    All other components within normal limits  PROTIME-INR  TYPE AND SCREEN    EKG None  Radiology No results found.  Procedures Procedures (including critical care time)  Medications Ordered in ED Medications - No data to display   Initial Impression / Assessment and Plan / ED Course  I have reviewed the triage vital signs and the nursing notes.  Pertinent labs & imaging results that were available during my care of the patient were reviewed by me and considered in my  medical decision making (see chart for details).        BZ:[id number] and diarrhea VS: BP (!) 149/76   Pulse 99   Temp 99.9 F (37.7 C) (Oral)   Resp 16   Ht '5\' 6"'  (1.676 m)   Wt 112 kg   SpO2 97%   BMI 39.87 kg/m  BP:ZWCHENI is gathered by Patient  and EMR/Nurse triage. DDX:The differential diagnosis of diarrhea includes but is not limited to Viral- norovirus/rotavirus;  Bacterial-Campylobacter,Shigella, Salmonella, Escherichia coli, E. coli 0157:H7, Yersinia enterocolitica, Vibrio cholerae, Clostridium difficile. Parasitic- Giardia lamblia, Cryptosporidium,Entamoeba histolytica,Cyclospora, Microsporidium. Toxin- Staphylococcus aureus, Bacillus cereus. Noninfectious causes include GI Bleed, Appendicitis, Mesenteric Ischemia, Diverticulitis, Adrenal Crisis, Thyroid Storm, Toxicologic exposures, Antibiotic or drug-associated, inflammatory bowel disease. Labs: I reviewed the labs which show hemoccult stool +, CMP with mild hypokalemia, slightly elevated blood glucose, CBC shows no leukocytosis or anemia Imaging: I personally reviewed the images (ct abd/pelv) which show(s) acute colitis by my interpretation. EKG:N/A DPO:EUMPNTI with acute colitis. She is afebrile and HDS. I reviewed the case with Dr. Kathrynn Humble.She does not have pain and no leukocytosis. She has minimal tenderness with deep palpation of the abdomen. Will treat with supportive care and  Patient disposition: Discharge Patient condition: Good. The patient appears reasonably screened and/or stabilized for discharge and I doubt any other medical condition or other [facility] requiring further screening, evaluation, or treatment in the ED at this time prior to discharge. I have discussed lab and/or imaging findings with the patient and answered all questions/concerns to the best of my ability. I have discussed return precautions and OP follow up.      Final Clinical Impressions(s) / ED Diagnoses   Final diagnoses:  None    ED Discharge Orders    None       Margarita Mail, PA-C 10/22/19 2148    Varney Biles, MD 10/25/19 1158

## 2019-10-22 NOTE — ED Triage Notes (Signed)
PT states bright red blood with clots noted with bowel movements starting x2 days ago. PT denies any history of GI bleed and takes 81 mg of aspirin.

## 2019-10-23 DIAGNOSIS — H5213 Myopia, bilateral: Secondary | ICD-10-CM | POA: Diagnosis not present

## 2019-10-23 DIAGNOSIS — Z86018 Personal history of other benign neoplasm: Secondary | ICD-10-CM | POA: Diagnosis not present

## 2019-10-23 DIAGNOSIS — H2513 Age-related nuclear cataract, bilateral: Secondary | ICD-10-CM | POA: Diagnosis not present

## 2019-10-23 DIAGNOSIS — H5347 Heteronymous bilateral field defects: Secondary | ICD-10-CM | POA: Diagnosis not present

## 2019-10-23 DIAGNOSIS — H3589 Other specified retinal disorders: Secondary | ICD-10-CM | POA: Diagnosis not present

## 2019-10-23 DIAGNOSIS — H02402 Unspecified ptosis of left eyelid: Secondary | ICD-10-CM | POA: Diagnosis not present

## 2019-10-23 DIAGNOSIS — H468 Other optic neuritis: Secondary | ICD-10-CM | POA: Diagnosis not present

## 2019-10-23 DIAGNOSIS — Z9889 Other specified postprocedural states: Secondary | ICD-10-CM | POA: Diagnosis not present

## 2019-10-26 ENCOUNTER — Encounter: Payer: Self-pay | Admitting: Emergency Medicine

## 2019-10-26 ENCOUNTER — Other Ambulatory Visit: Payer: Self-pay

## 2019-10-26 ENCOUNTER — Inpatient Hospital Stay
Admission: EM | Admit: 2019-10-26 | Discharge: 2019-10-28 | DRG: 871 | Disposition: A | Discharge status: Home / Self Care | Payer: Medicare HMO | Attending: Internal Medicine | Admitting: Internal Medicine

## 2019-10-26 DIAGNOSIS — E039 Hypothyroidism, unspecified: Secondary | ICD-10-CM

## 2019-10-26 DIAGNOSIS — A0472 Enterocolitis due to Clostridium difficile, not specified as recurrent: Secondary | ICD-10-CM | POA: Diagnosis not present

## 2019-10-26 DIAGNOSIS — R197 Diarrhea, unspecified: Secondary | ICD-10-CM | POA: Diagnosis present

## 2019-10-26 DIAGNOSIS — Z8349 Family history of other endocrine, nutritional and metabolic diseases: Secondary | ICD-10-CM | POA: Diagnosis not present

## 2019-10-26 DIAGNOSIS — I10 Essential (primary) hypertension: Secondary | ICD-10-CM | POA: Diagnosis present

## 2019-10-26 DIAGNOSIS — J302 Other seasonal allergic rhinitis: Secondary | ICD-10-CM | POA: Diagnosis present

## 2019-10-26 DIAGNOSIS — H5462 Unqualified visual loss, left eye, normal vision right eye: Secondary | ICD-10-CM | POA: Diagnosis present

## 2019-10-26 DIAGNOSIS — K219 Gastro-esophageal reflux disease without esophagitis: Secondary | ICD-10-CM | POA: Diagnosis present

## 2019-10-26 DIAGNOSIS — E876 Hypokalemia: Secondary | ICD-10-CM | POA: Diagnosis not present

## 2019-10-26 DIAGNOSIS — D72829 Elevated white blood cell count, unspecified: Secondary | ICD-10-CM | POA: Diagnosis present

## 2019-10-26 DIAGNOSIS — A498 Other bacterial infections of unspecified site: Secondary | ICD-10-CM | POA: Diagnosis present

## 2019-10-26 DIAGNOSIS — J449 Chronic obstructive pulmonary disease, unspecified: Secondary | ICD-10-CM | POA: Diagnosis not present

## 2019-10-26 DIAGNOSIS — Z7982 Long term (current) use of aspirin: Secondary | ICD-10-CM

## 2019-10-26 DIAGNOSIS — N179 Acute kidney failure, unspecified: Secondary | ICD-10-CM | POA: Diagnosis present

## 2019-10-26 DIAGNOSIS — Z8249 Family history of ischemic heart disease and other diseases of the circulatory system: Secondary | ICD-10-CM

## 2019-10-26 DIAGNOSIS — R6520 Severe sepsis without septic shock: Secondary | ICD-10-CM | POA: Diagnosis present

## 2019-10-26 DIAGNOSIS — E861 Hypovolemia: Secondary | ICD-10-CM | POA: Diagnosis not present

## 2019-10-26 DIAGNOSIS — Z86011 Personal history of benign neoplasm of the brain: Secondary | ICD-10-CM | POA: Diagnosis not present

## 2019-10-26 DIAGNOSIS — A414 Sepsis due to anaerobes: Secondary | ICD-10-CM | POA: Diagnosis not present

## 2019-10-26 DIAGNOSIS — Z79899 Other long term (current) drug therapy: Secondary | ICD-10-CM | POA: Diagnosis not present

## 2019-10-26 DIAGNOSIS — R42 Dizziness and giddiness: Secondary | ICD-10-CM | POA: Diagnosis not present

## 2019-10-26 DIAGNOSIS — Z20828 Contact with and (suspected) exposure to other viral communicable diseases: Secondary | ICD-10-CM | POA: Diagnosis not present

## 2019-10-26 DIAGNOSIS — E785 Hyperlipidemia, unspecified: Secondary | ICD-10-CM | POA: Diagnosis not present

## 2019-10-26 DIAGNOSIS — N170 Acute kidney failure with tubular necrosis: Secondary | ICD-10-CM | POA: Diagnosis present

## 2019-10-26 DIAGNOSIS — E119 Type 2 diabetes mellitus without complications: Secondary | ICD-10-CM | POA: Diagnosis present

## 2019-10-26 DIAGNOSIS — Z79890 Hormone replacement therapy (postmenopausal): Secondary | ICD-10-CM

## 2019-10-26 DIAGNOSIS — Z823 Family history of stroke: Secondary | ICD-10-CM | POA: Diagnosis not present

## 2019-10-26 DIAGNOSIS — I9589 Other hypotension: Secondary | ICD-10-CM | POA: Diagnosis not present

## 2019-10-26 DIAGNOSIS — A09 Infectious gastroenteritis and colitis, unspecified: Secondary | ICD-10-CM

## 2019-10-26 DIAGNOSIS — Z87891 Personal history of nicotine dependence: Secondary | ICD-10-CM | POA: Diagnosis not present

## 2019-10-26 DIAGNOSIS — Z881 Allergy status to other antibiotic agents status: Secondary | ICD-10-CM

## 2019-10-26 DIAGNOSIS — R0902 Hypoxemia: Secondary | ICD-10-CM | POA: Diagnosis not present

## 2019-10-26 DIAGNOSIS — Z806 Family history of leukemia: Secondary | ICD-10-CM

## 2019-10-26 DIAGNOSIS — R000 Tachycardia, unspecified: Secondary | ICD-10-CM | POA: Diagnosis not present

## 2019-10-26 DIAGNOSIS — I959 Hypotension, unspecified: Secondary | ICD-10-CM | POA: Diagnosis present

## 2019-10-26 LAB — URINALYSIS, COMPLETE (UACMP) WITH MICROSCOPIC
Bacteria, UA: NONE SEEN
Bilirubin Urine: NEGATIVE
Glucose, UA: NEGATIVE mg/dL
Hgb urine dipstick: NEGATIVE
Ketones, ur: NEGATIVE mg/dL
Leukocytes,Ua: NEGATIVE
Nitrite: NEGATIVE
Protein, ur: NEGATIVE mg/dL
Specific Gravity, Urine: 1.013 (ref 1.005–1.030)
pH: 5 (ref 5.0–8.0)

## 2019-10-26 LAB — COMPREHENSIVE METABOLIC PANEL
ALT: 27 U/L (ref 0–44)
AST: 35 U/L (ref 15–41)
Albumin: 3.2 g/dL — ABNORMAL LOW (ref 3.5–5.0)
Alkaline Phosphatase: 72 U/L (ref 38–126)
Anion gap: 16 — ABNORMAL HIGH (ref 5–15)
BUN: 43 mg/dL — ABNORMAL HIGH (ref 6–20)
CO2: 24 mmol/L (ref 22–32)
Calcium: 8.6 mg/dL — ABNORMAL LOW (ref 8.9–10.3)
Chloride: 94 mmol/L — ABNORMAL LOW (ref 98–111)
Creatinine, Ser: 2.52 mg/dL — ABNORMAL HIGH (ref 0.44–1.00)
GFR calc Af Amer: 24 mL/min — ABNORMAL LOW (ref >60)
GFR calc non Af Amer: 21 mL/min — ABNORMAL LOW (ref >60)
Glucose, Bld: 157 mg/dL — ABNORMAL HIGH (ref 70–99)
Potassium: 2.7 mmol/L — CL (ref 3.5–5.1)
Sodium: 134 mmol/L — ABNORMAL LOW (ref 135–145)
Total Bilirubin: 0.8 mg/dL (ref 0.3–1.2)
Total Protein: 7 g/dL (ref 6.5–8.1)

## 2019-10-26 LAB — CBC
HCT: 34.9 % — ABNORMAL LOW (ref 36.0–46.0)
Hemoglobin: 12.1 g/dL (ref 12.0–15.0)
MCH: 29.5 pg (ref 26.0–34.0)
MCHC: 34.7 g/dL (ref 30.0–36.0)
MCV: 85.1 fL (ref 80.0–100.0)
Platelets: 337 10*3/uL (ref 150–400)
RBC: 4.1 MIL/uL (ref 3.87–5.11)
RDW: 12.9 % (ref 11.5–15.5)
WBC: 17.8 10*3/uL — ABNORMAL HIGH (ref 4.0–10.5)
nRBC: 0 % (ref 0.0–0.2)

## 2019-10-26 LAB — C DIFFICILE QUICK SCREEN W PCR REFLEX
C Diff antigen: POSITIVE — AB
C Diff toxin: NEGATIVE

## 2019-10-26 LAB — LIPASE, BLOOD: Lipase: 27 U/L (ref 11–51)

## 2019-10-26 LAB — MAGNESIUM: Magnesium: 2.3 mg/dL (ref 1.7–2.4)

## 2019-10-26 LAB — CLOSTRIDIUM DIFFICILE BY PCR, REFLEXED: Toxigenic C. Difficile by PCR: POSITIVE — AB

## 2019-10-26 MED ORDER — TRAMADOL HCL 50 MG PO TABS: 50.0000 mg | ORAL_TABLET | Freq: Four times a day (QID) | ORAL | Status: DC | PRN

## 2019-10-26 MED ORDER — LACTATED RINGERS IV SOLN
INTRAVENOUS | Status: DC
  Administered 2019-10-26 – 2019-10-28 (×3): via INTRAVENOUS

## 2019-10-26 MED ORDER — SODIUM CHLORIDE 0.9 % IV BOLUS
1000.0000 mL | Freq: Once | INTRAVENOUS | Status: AC
  Administered 2019-10-26: 1000 mL via INTRAVENOUS

## 2019-10-26 MED ORDER — ALPRAZOLAM 0.5 MG PO TABS: 0.5000 mg | ORAL_TABLET | Freq: Three times a day (TID) | ORAL | Status: DC | PRN

## 2019-10-26 MED ORDER — LORATADINE 10 MG PO TABS
10.0000 mg | ORAL_TABLET | Freq: Every day | ORAL | Status: DC
  Administered 2019-10-27 – 2019-10-28 (×2): 10 mg via ORAL
  Filled 2019-10-26 (×2): qty 1

## 2019-10-26 MED ORDER — FLUTICASONE PROPIONATE 50 MCG/ACT NA SUSP
2.0000 | Freq: Every day | NASAL | Status: DC
  Administered 2019-10-28: 09:00:00 2 via NASAL
  Filled 2019-10-26: qty 16

## 2019-10-26 MED ORDER — MAGNESIUM CITRATE PO SOLN
1.0000 | Freq: Once | ORAL | Status: DC | PRN
  Filled 2019-10-26: qty 296

## 2019-10-26 MED ORDER — LEVOTHYROXINE SODIUM 100 MCG PO TABS
100.0000 ug | ORAL_TABLET | Freq: Every day | ORAL | Status: DC
  Administered 2019-10-27 – 2019-10-28 (×2): 100 ug via ORAL
  Filled 2019-10-26 (×2): qty 1

## 2019-10-26 MED ORDER — ACETAMINOPHEN 650 MG RE SUPP: 650.0000 mg | Freq: Four times a day (QID) | RECTAL | Status: DC | PRN

## 2019-10-26 MED ORDER — TRAZODONE HCL 50 MG PO TABS
25.0000 mg | ORAL_TABLET | Freq: Every evening | ORAL | Status: DC | PRN
  Filled 2019-10-26: qty 0.5

## 2019-10-26 MED ORDER — POTASSIUM CHLORIDE 10 MEQ/100ML IV SOLN
10.0000 meq | INTRAVENOUS | Status: AC
  Administered 2019-10-26 (×3): 10 meq via INTRAVENOUS
  Filled 2019-10-26 (×3): qty 100

## 2019-10-26 MED ORDER — POLYETHYLENE GLYCOL 3350 17 G PO PACK
17.0000 g | PACK | Freq: Every day | ORAL | Status: DC | PRN
  Filled 2019-10-26: qty 1

## 2019-10-26 MED ORDER — ACETAMINOPHEN 325 MG PO TABS: 650.0000 mg | ORAL_TABLET | Freq: Four times a day (QID) | ORAL | Status: DC | PRN

## 2019-10-26 MED ORDER — ATORVASTATIN CALCIUM 20 MG PO TABS
40.0000 mg | ORAL_TABLET | Freq: Every day | ORAL | Status: DC
  Administered 2019-10-26 – 2019-10-27 (×2): 40 mg via ORAL
  Filled 2019-10-26 (×2): qty 2

## 2019-10-26 MED ORDER — ASPIRIN EC 81 MG PO TBEC
81.0000 mg | DELAYED_RELEASE_TABLET | Freq: Every day | ORAL | Status: DC
  Administered 2019-10-27 – 2019-10-28 (×2): 81 mg via ORAL
  Filled 2019-10-26 (×2): qty 1

## 2019-10-26 MED ORDER — ADULT MULTIVITAMIN W/MINERALS CH
1.0000 | ORAL_TABLET | Freq: Every day | ORAL | Status: DC
  Administered 2019-10-27 – 2019-10-28 (×2): 1 via ORAL
  Filled 2019-10-26 (×2): qty 1

## 2019-10-26 MED ORDER — SODIUM CHLORIDE 0.9 % IV BOLUS
1000.0000 mL | Freq: Once | INTRAVENOUS | Status: AC
  Administered 2019-10-27: 1000 mL via INTRAVENOUS

## 2019-10-26 MED ORDER — BISACODYL 5 MG PO TBEC
5.0000 mg | DELAYED_RELEASE_TABLET | Freq: Every day | ORAL | Status: DC | PRN
  Filled 2019-10-26: qty 1

## 2019-10-26 MED ORDER — SODIUM CHLORIDE 0.9% FLUSH: 3.0000 mL | Freq: Once | INTRAVENOUS | Status: DC

## 2019-10-26 MED ORDER — PANTOPRAZOLE SODIUM 40 MG PO TBEC
40.0000 mg | DELAYED_RELEASE_TABLET | Freq: Every day | ORAL | Status: DC
  Administered 2019-10-26 – 2019-10-28 (×3): 40 mg via ORAL
  Filled 2019-10-26 (×3): qty 1

## 2019-10-26 MED ORDER — ONDANSETRON HCL 4 MG PO TABS
4.0000 mg | ORAL_TABLET | Freq: Four times a day (QID) | ORAL | Status: DC | PRN
  Filled 2019-10-26: qty 1

## 2019-10-26 MED ORDER — ONDANSETRON HCL 4 MG/2ML IJ SOLN: 4.0000 mg | Freq: Four times a day (QID) | INTRAMUSCULAR | Status: DC | PRN

## 2019-10-26 NOTE — ED Provider Notes (Signed)
[facility] Emergency Department Provider Note  ____________________________________________   First MD Initiated Contact with Patient 10/26/19 1424     (approximate)  I have reviewed the triage vital signs and the nursing notes.   HISTORY  Chief Complaint Diarrhea   HPI Yolanda Gallegos is a 58 y.o. 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  Patient was seen on 11/30 at outside ED due to blood in her stool.  Patient was diagnosed with acute colitis.  Patient was discharged home.  Patient is not coming in for dizziness.  Patient says that she is been having diarrhea continuously for the past 2 weeks.  Patient was noted to be hypotensive 75/46.   Patient states she has had continuous diarrhea for the past 2 weeks, intermittent, severe, nothing makes it better, nothing makes it worse.  Her white count then was normal at 10.4.  Her CT scan was concerning for colitis.  She was d/c home not on antibiotics and had no stool samples taken.  She reports continued symptoms.  No abdominal pain.  However because of all the diarrhea she has been feeling lightheaded with ambulation can barely make it to the bathroom.  She has had some near syncopal episodes but is never actually passed out or hit her head.  She denies any chest pain, shortness of breath  Diarrhea is severe, intermittent, nothing makes better, nothing makes it worse.          Past Medical History:  Diagnosis Date  . Allergy   . Altered gait    USES CANE TO JUDGE DISTANCE DUE TO BLINDNESS IN LEFT EYE  . Colon polyps 2015  . COPD (chronic obstructive pulmonary disease) ([place])   . Diabetes mellitus without complication ([place])   . Emphysema of lung ([place])   . GERD (gastroesophageal reflux disease)   . Hyperlipidemia   . Hypertension   . Legally blind in left eye, as defined in [place] 2009  . 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 Multinodular goiter    subclinical hyperthyroidism  . Smoker   . Subclinical hyperthyroidism    due to multinodular goiter.  Confirmed on [place]ak escan 2015  . Tubular adenoma 2015    Patient Active Problem List   Diagnosis Date Noted  . Gastroesophageal reflux disease 09/29/2019  . Palpitations 08/27/2017  . PSVT (paroxysmal supraventricular tachycardia) ([place]) 08/27/2017  . Hyperthyroidism 07/01/2017  . Atypical chest pain 04/22/2017  . NSVT (nonsustained ventricular tachycardia) ([place]) 04/22/2017  . Multinodular goiter   . Adenomatous colon polyp 02/11/2014  . Essential hypertension   . Hyperlipidemia   . COPD (chronic obstructive pulmonary disease) ([place])   . Smoker     Past Surgical History:  Procedure Laterality Date  . ABDOMINAL HYSTERECTOMY  1999   left oopherectomy  . 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 BREAST BIOPSY Left 2007   benign  . [place]   3 times  . INNER EAR SURGERY Right 2001  . LEFT HEART CATH AND CORONARY ANGIOGRAPHY N/A 04/22/2017   Procedure: Left Heart Cath and Coronary Angiography;  Surgeon: Nelva Bush, MD;  Location: [facility];  Service: Cardiovascular;  Laterality: N/A;    Prior to Admission medications   Medication Sig Start Date End Date Taking? Authorizing Provider  ACCU-CHEK AVIVA PLUS test  strip TEST BLOOD SUGAR TWO TO THREE TIMES DAILY(SCHEDULE APPOINTMENT FOR FUTURE REFILLS) 10/22/19   [name], MD  Accu-Chek Softclix Lancets lancets USE AS INSTRUCTED 10/22/19   [name], MD  acetaminophen (TYLENOL) 500 MG tablet Take 1,000 mg by mouth 3 (three) times daily as needed for moderate pain or headache.     [provider]  Alcohol Swabs (B-D SINGLE USE SWABS REGULAR) PADS USE AS DIRECTED AS NEEDED FOR FINGERSTICK AND INSULIN INJECTION (NEEDS APPOINTMENT FOR FUTURE REFILLS) 10/22/19   [name], MD  ALPRAZolam  Duanne Moron) 0.5 MG tablet Take 1 tablet (0.5 mg total) by mouth 3 (three) times daily as needed for anxiety or sleep. 09/17/19   [name], MD  amLODipine (NORVASC) 5 MG tablet TAKE 1 TABLET EVERY DAY Patient taking differently: Take 5 mg by mouth daily as needed.  10/22/19   [name], MD  aspirin EC 81 MG tablet Take 1 tablet (81 mg total) by mouth daily. 03/23/17   End, Harrell Gave, MD  atorvastatin (LIPITOR) 80 MG tablet TAKE 1 TABLET DAILY (DISCONTINUE ATORVASTATIN 40MG) Patient taking differently: Take 80 mg by mouth daily. TAKE 1 TABLET DAILY (DISCONTINUE ATORVASTATIN 40MG) 10/22/19   [name], MD  Blood Glucose Monitoring Suppl (ACCU-CHEK AVIVA PLUS) w/Device KIT TEST BLOOD SUGAR TWICE DAILY TO THREE TIMES DAILY 05/12/17   [name], MD  fluticasone (FLONASE) 50 MCG/ACT nasal spray USE 2 SPRAYS IN EACH NOSTRIL EVERY DAY Patient taking differently: Place 2 sprays into both nostrils daily.  10/22/19   [name], MD  hydrocortisone 2.5 % cream Apply topically 2 (two) times daily. 10/09/19   [name], MD  levothyroxine (SYNTHROID, LEVOTHROID) 100 MCG tablet TAKE 1 TABLET EVERY DAY BEFORE BREAKFAST Patient taking differently: Take 100 mcg by mouth daily before breakfast. TAKE 1 TABLET EVERY DAY BEFORE BREAKFAST 11/01/18   [name], MD  losartan (COZAAR) 100 MG tablet TAKE 1 TABLET EVERY DAY Patient taking differently: Take 100 mg by mouth daily.  02/19/19   [name], MD  Magnesium 400 MG TABS Take 400 mg by mouth 2 (two) times daily.     [provider]  metoprolol succinate (TOPROL-XL) 50 MG 24 hr tablet TAKE 1 TABLET TWICE DAILY Patient taking differently: Take 50 mg by mouth 2 (two) times daily.  05/14/19   End, Harrell Gave, MD  Multiple Vitamin (MULTIVITAMIN WITH MINERALS) TABS tablet Take 1 tablet by mouth daily.    [provider]  omeprazole (PRILOSEC) 20 MG capsule TAKE 1 CAPSULE EVERY DAY Patient taking  differently: Take 20 mg by mouth daily.  12/18/18   [name], MD  potassium chloride SA (K-DUR) 20 MEQ tablet TAKE 2 TABLETS EVERY DAY Patient taking differently: Take 20 mEq by mouth daily.  05/14/19   End, Harrell Gave, MD  triamcinolone cream (KENALOG) 0.5 % Apply 1 application topically 3 (three) times daily. 10/09/19   [name], MD  ZYRTEC ALLERGY 10 MG tablet Take 10 mg by mouth daily.  07/13/15   [provider]    Allergies Doxycycline, Mobic [meloxicam], Glyxambi [empagliflozin-linagliptin], and Sulfa antibiotics  Family History  Problem Relation Age of Onset  . Hypertension Mother   . Cancer Mother        cervical  . Cancer Father        leukemia  . Hyperlipidemia Sister   . Hyperlipidemia Brother   . Heart attack Brother 25  . Stroke Brother   .  Leukemia Son   . Colon cancer Neg Hx   . Esophageal cancer Neg Hx   . Rectal cancer Neg Hx   . Stomach cancer Neg Hx   . Thyroid disease Neg Hx     Social History Social History   Tobacco Use  . Smoking status: Former Smoker    Packs/day: 1.50    Years: 30.00    Pack years: 45.00    Types: Cigarettes    Quit date: 2013    Years since quitting: 7.9  . Smokeless tobacco: Never Used  Substance Use Topics  . Alcohol use: Yes    Alcohol/week: 1.0 standard drinks    Types: 1 Glasses of wine per week    Comment: one nightly cocktail  . Drug use: No      Review of Systems Constitutional: No fever/chills Eyes: No visual changes. ENT: No sore throat. Cardiovascular: Denies chest pain. Respiratory: Denies shortness of breath. Gastrointestinal: No abdominal pain.  No nausea, no vomiting.  Positive diarrhea Genitourinary: Negative for dysuria. Musculoskeletal: Negative for back pain. Skin: Negative for rash. Neurological: Negative for headaches, focal weakness or numbness.  Positive lightheadedness All other ROS negative ____________________________________________   PHYSICAL EXAM:   VITAL SIGNS: ED Triage Vitals  Enc Vitals Group     BP 10/26/19 1347 (!) 75/46     Pulse Rate 10/26/19 1346 93     Resp 10/26/19 1346 20     Temp 10/26/19 1346 98.6 F (37 C)     Temp Source 10/26/19 1346 Oral     SpO2 --      Weight 10/26/19 1345 247 lb (112 kg)     Height 10/26/19 1345 '5\' 6"'  (1.676 m)     Head Circumference --      Peak Flow --      Pain Score 10/26/19 1350 0     Pain Loc --      Pain Edu? --      Excl. in [place]? --     Constitutional: Alert and oriented. Well appearing and in no acute distress. Eyes: Conjunctivae are normal. EOMI. Head: Atraumatic. Nose: No congestion/rhinnorhea. Mouth/Throat: Mucous membranes are moist.   Neck: No stridor. Trachea Midline. FROM Cardiovascular: Normal rate, regular rhythm. Grossly normal heart sounds.  Good peripheral circulation. Respiratory: Normal respiratory effort.  No retractions. Lungs CTAB. Gastrointestinal: Soft and nontender. No distention. No abdominal bruits.  Musculoskeletal: No lower extremity tenderness nor edema.  No joint effusions. Neurologic:  Normal speech and language. No gross focal neurologic deficits are appreciated.  Skin:  Skin is warm, dry and intact. No rash noted. Psychiatric: Mood and affect are normal. Speech and behavior are normal. GU: Deferred   ____________________________________________   LABS (all labs ordered are listed, but only abnormal results are displayed)  Labs Reviewed  COMPREHENSIVE METABOLIC PANEL - Abnormal; Notable for the following components:      Result Value   Sodium 134 (*)    Potassium 2.7 (*)    Chloride 94 (*)    Glucose, Bld 157 (*)    BUN 43 (*)    Creatinine, Ser 2.52 (*)    Calcium 8.6 (*)    Albumin 3.2 (*)    GFR calc non Af Amer 21 (*)    GFR calc Af Amer 24 (*)    Anion gap 16 (*)    All other components within normal limits  CBC - Abnormal; Notable for the following components:   WBC 17.8 (*)    HCT  34.9 (*)    All other components within  normal limits  SARS CORONAVIRUS 2 (TAT 6-24 HRS)  GASTROINTESTINAL PANEL BY PCR, STOOL (REPLACES STOOL CULTURE)  C DIFFICILE QUICK SCREEN W PCR REFLEX  LIPASE, BLOOD  URINALYSIS, COMPLETE (UACMP) WITH MICROSCOPIC  MAGNESIUM   ____________________________________________   ED ECG REPORT I, Vanessa [place], the attending physician, personally viewed and interpreted this ECG.  EKG is normal sinus rhythm 96, no ST elevations, T wave inversions in V2 and V3, normal intervals ____________________________________________  RADIOLOGY  ____________________________________________   PROCEDURES  Procedure(s) performed (including Critical Care):  Procedures   ____________________________________________   INITIAL IMPRESSION / ASSESSMENT AND PLAN / ED COURSE  TATE JERKINS was evaluated in Emergency Department on 10/26/2019 for the symptoms described in the history of present illness. She was evaluated in the context of the global COVID-19 pandemic, which necessitated consideration that the patient might be at risk for infection with the SARS-CoV-2 virus that causes COVID-19. Institutional protocols and algorithms that pertain to the evaluation of patients at risk for COVID-19 are in a state of rapid change based on information released by regulatory bodies including the CDC and federal and state organizations. These policies and algorithms were followed during the patient's care in the ED.     Patient is a 58 year old who presents with continued diarrhea for the past 2 weeks after having a CT scan that was concerning for colitis.  Will get labs to evaluate for electrolyte abnormalities, AKI.  No abdominal tenderness at this time and I think patient needs to be reimaged given her low suspicion for perforation given her abdominal exam.  Patient was hypotensive so we will get labs to make sure she is not anemic.  Most likely secondary to dehydration though.  Will get stool studies given my concern  for this could be from C. Difficile.  Hemoglobin is stable no evidence of GI bleed.  However patient does have significant dehydration with elevated creatinine of 2.5 as well as potassium of 2.7.  Will give an additional 1 L fluid.  Already got 1 L with EMS.  Will replete potassium will start with 30 mEq due to the AKI.  Will check magnesium.  Given these abnormalities will discuss with hospital team for admission.  Stool panels are pending.       ____________________________________________   FINAL CLINICAL IMPRESSION(S) / ED DIAGNOSES   Final diagnoses:  Diarrhea of infectious origin  AKI (acute kidney injury) ([place])  Hypokalemia      MEDICATIONS GIVEN DURING THIS VISIT:  Medications  potassium chloride 10 mEq in 100 mL IVPB (has no administration in time range)  sodium chloride 0.9 % bolus 1,000 mL (has no administration in time range)  sodium chloride 0.9 % bolus 1,000 mL (1,000 mLs Intravenous New Bag/Given 10/26/19 1355)     ED Discharge Orders    None       Note:  This document was prepared using Dragon voice recognition software and may include unintentional dictation errors.   Vanessa [place], MD 10/26/19 [phone]

## 2019-10-26 NOTE — ED Notes (Signed)
C/o weakness x2wks. Denies any cough or fever. Pt ambulatory to stretcher . Speech clear

## 2019-10-26 NOTE — ED Notes (Signed)
Pt given dinner tray at this time.  

## 2019-10-26 NOTE — ED Triage Notes (Signed)
Pt to ED via [facility] from home for dizziness. Pt states she was diagnosed with Colitis last week at [facility]. Pt states that she has had diarrhea x 2 weeks. Pt is hypotensive in triage at 75/46. Pt was orthostatic with EMS Pt appears pale. Charge RN aware of pts BP

## 2019-10-26 NOTE — Progress Notes (Signed)
Report called to [facility], Therapist, sports. Pt stable and talkative at transfer.

## 2019-10-26 NOTE — ED Notes (Signed)
ED Provider at bedside. 

## 2019-10-26 NOTE — H&P (Signed)
History and Physical    Yolanda Gallegos FWY:[id number] DOB: 13-Feb-1961 DOA: 10/26/2019  PCP: [name], MD  Patient coming from: Home  I have personally briefly reviewed patient's old medical records in [place]  Chief Complaint: diarrhea, dizziness  HPI: Yolanda Gallegos is a 58 y.o. 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  States stool has been intermittently with bright red blood.  Has not had formed stools since onset of diarrhea.  No associated abdominal pain.  Does report nausea as well, but no vomiting.  Reports she had recently completed 14 days of antibiotics for sinus infection prior to onset of diarrhea.  Denies any history of C. difficile, but reports her son had it in the past (son is ventilator dependent at home, patient is his primary caregiver).  Denies any sick contacts.  Has been following public health guidelines.  Denies any aggravating or alleviating factors.  Patient was seen on 11/30 at another ED due to blood in her stool, and reports she was diagnosed with colitis and discharged home however no stool studies were apparently done at the time.  ED Course: Hypotensive 75/46, HR 93.  Labs notable for sodium 134, potassium 2.7, chloride 94, BUN 43, creatinine 2.52, glucose 157, leukocytosis 17.8k.  Hemoglobin normal at 12.1.  Of note, renal function on 11/30 was normal 0.72, normal white count at that time.  FOBT positive.  Covid test pending.  CT abdomen pelvis from 1130 showed signs of colitis.  Stool studies were ordered including GI panel and C. Difficile.  Review of Systems: As per HPI otherwise 10 point review of systems negative.   Past Medical History:  Diagnosis Date  . Allergy   . Altered gait    USES CANE TO JUDGE DISTANCE DUE TO BLINDNESS IN LEFT EYE  . Colon  polyps 2015  . COPD (chronic obstructive pulmonary disease) ([place])   . Diabetes mellitus without complication ([place])   . Emphysema of lung ([place])   . GERD (gastroesophageal reflux disease)   . Hyperlipidemia   . Hypertension   . Legally blind in left eye, as defined in [place] 2009  . Meningioma ([place]) 2009   on left side brain behind left eye/ bliindness left eye  . Multinodular goiter    subclinical hyperthyroidism  . Smoker   . Subclinical hyperthyroidism    due to multinodular goiter.  Confirmed on uptak escan 2015  . Tubular adenoma 2015    Past Surgical History:  Procedure Laterality Date  . ABDOMINAL HYSTERECTOMY  1999   left oopherectomy  . 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 BREAST BIOPSY Left 2007   benign  . [place]   3 times  . INNER EAR SURGERY Right 2001  . LEFT HEART CATH AND CORONARY ANGIOGRAPHY N/A 04/22/2017   Procedure: Left Heart Cath and Coronary Angiography;  Surgeon: Nelva Bush, MD;  Location: [facility];  Service: Cardiovascular;  Laterality: N/A;     reports that she quit smoking about 7 years ago. Her smoking use included cigarettes. She has a 45.00 pack-year smoking history. She has never used smokeless tobacco. She reports current alcohol use of about 1.0 standard drinks of alcohol per week. She reports that she does not use drugs.  Allergies  Allergen Reactions  . Doxycycline Hives and Shortness Of Breath  . Mobic [Meloxicam]     "feeling of paralysis"  . Glyxambi [Empagliflozin-Linagliptin] Itching  . Sulfa Antibiotics     Per positive allergy test    Family History  Problem Relation Age of Onset  . Hypertension Mother   . Cancer Mother        cervical  . Cancer Father        leukemia  . Hyperlipidemia Sister   . Hyperlipidemia Brother   . Heart attack Brother 58  . Stroke Brother   . Leukemia Son   . Colon cancer Neg Hx   . Esophageal cancer Neg Hx   . Rectal cancer  Neg Hx   . Stomach cancer Neg Hx   . Thyroid disease Neg Hx     Prior to Admission medications   Medication Sig Start Date End Date Taking? Authorizing Provider  ACCU-CHEK AVIVA PLUS test strip TEST BLOOD SUGAR TWO TO THREE TIMES DAILY(SCHEDULE APPOINTMENT FOR FUTURE REFILLS) 10/22/19   [name], MD  Accu-Chek Softclix Lancets lancets USE AS INSTRUCTED 10/22/19   [name], MD  acetaminophen (TYLENOL) 500 MG tablet Take 1,000 mg by mouth 3 (three) times daily as needed for moderate pain or headache.     [provider]  Alcohol Swabs (B-D SINGLE USE SWABS REGULAR) PADS USE AS DIRECTED AS NEEDED FOR FINGERSTICK AND INSULIN INJECTION (NEEDS APPOINTMENT FOR FUTURE REFILLS) 10/22/19   [name], MD  ALPRAZolam Duanne Moron) 0.5 MG tablet Take 1 tablet (0.5 mg total) by mouth 3 (three) times daily as needed for anxiety or sleep. 09/17/19   [name], MD  amLODipine (NORVASC) 5 MG tablet TAKE 1 TABLET EVERY DAY Patient taking differently: Take 5 mg by mouth daily as needed.  10/22/19   [name], MD  aspirin EC 81 MG tablet Take 1 tablet (81 mg total) by mouth daily. 03/23/17   End, Harrell Gave, MD  atorvastatin (LIPITOR) 80 MG tablet TAKE 1 TABLET DAILY (DISCONTINUE ATORVASTATIN 40MG) Patient taking differently: Take 80 mg by mouth daily. TAKE 1 TABLET DAILY (DISCONTINUE ATORVASTATIN 40MG) 10/22/19   [name], MD  Blood Glucose Monitoring Suppl (ACCU-CHEK AVIVA PLUS) w/Device KIT TEST BLOOD SUGAR TWICE DAILY TO THREE TIMES DAILY 05/12/17   [name], MD  fluticasone (FLONASE) 50 MCG/ACT nasal spray USE 2 SPRAYS IN EACH NOSTRIL EVERY DAY Patient taking differently: Place 2 sprays into both nostrils daily.  10/22/19   [name], MD  hydrocortisone 2.5 % cream Apply topically 2 (two) times daily. 10/09/19   [name], MD  levothyroxine (SYNTHROID, LEVOTHROID) 100 MCG tablet TAKE 1 TABLET EVERY DAY BEFORE BREAKFAST Patient taking  differently: Take 100 mcg by mouth daily before breakfast. TAKE 1 TABLET EVERY DAY BEFORE BREAKFAST 11/01/18   [name], MD  losartan (COZAAR) 100 MG tablet TAKE 1 TABLET EVERY DAY Patient taking differently: Take 100 mg by mouth daily.  02/19/19   [name], MD  Magnesium 400 MG TABS Take 400 mg by mouth 2 (two) times daily.     [provider]  metoprolol succinate (TOPROL-XL) 50 MG 24 hr tablet TAKE 1 TABLET TWICE DAILY Patient taking differently: Take 50 mg by mouth 2 (two) times daily.  05/14/19   End, Harrell Gave, MD  Multiple Vitamin (MULTIVITAMIN WITH MINERALS) TABS tablet Take 1 tablet by mouth daily.    [provider]  omeprazole (PRILOSEC) 20 MG  capsule TAKE 1 CAPSULE EVERY DAY Patient taking differently: Take 20 mg by mouth daily.  12/18/18   [name], MD  potassium chloride SA (K-DUR) 20 MEQ tablet TAKE 2 TABLETS EVERY DAY Patient taking differently: Take 20 mEq by mouth daily.  05/14/19   End, Harrell Gave, MD  triamcinolone cream (KENALOG) 0.5 % Apply 1 application topically 3 (three) times daily. 10/09/19   [name], MD  ZYRTEC ALLERGY 10 MG tablet Take 10 mg by mouth daily.  07/13/15   [provider]    Physical Exam: Vitals:   10/26/19 1350 10/26/19 1436 10/26/19 1828 10/26/19 2015  BP:  (!) 94/56 (!) 77/42 (!) 84/56  Pulse:    (!) 103  Resp:    17  Temp:   98.6 F (37 C) 98.6 F (37 C)  TempSrc:   Oral Oral  SpO2:   97% 98%  Weight: 112 kg     Height: 5' 6" (1.676 m)       Constitutional: NAD, calm, comfortable Vitals:   10/26/19 1350 10/26/19 1436 10/26/19 1828 10/26/19 2015  BP:  (!) 94/56 (!) 77/42 (!) 84/56  Pulse:    (!) 103  Resp:    17  Temp:   98.6 F (37 C) 98.6 F (37 C)  TempSrc:   Oral Oral  SpO2:   97% 98%  Weight: 112 kg     Height: 5' 6" (1.676 m)      Eyes: EOMI, lids and conjunctivae normal ENMT: Mucous membranes are moist. Normal dentition.  Respiratory: clear to auscultation  bilaterally, no wheezing, no crackles. Normal respiratory effort. No accessory muscle use.  Cardiovascular: Regular rate and rhythm, no murmurs / rubs / gallops. No extremity edema.  Abdomen: no tenderness, no distention, no masses palpated. No hepatosplenomegaly. Bowel sounds hyperactive.  Musculoskeletal: no clubbing / cyanosis. No joint deformity upper and lower extremities.  Skin: dry, intact, normal color, normal temperature Neurologic: CN 2-12 grossly intact. Normal speech. Psychiatric: Normal judgment and insight. Alert and oriented x 3. Normal mood.    Labs on Admission: I have personally reviewed following labs and imaging studies  CBC: Recent Labs  Lab 10/22/19 1214 10/26/19 1352  WBC 10.4 17.8*  NEUTROABS 7.3  --   HGB 12.8 12.1  HCT 40.2 34.9*  MCV 94.1 85.1  PLT 270 098   Basic Metabolic Panel: Recent Labs  Lab 10/22/19 1214 10/26/19 1352  NA 141 134*  K 3.3* 2.7*  CL 103 94*  CO2 27 24  GLUCOSE 117* 157*  BUN 11 43*  CREATININE 0.72 2.52*  CALCIUM 9.3 8.6*  MG  --  2.3   GFR: Estimated Creatinine Clearance: 31.6 mL/min (A) (by C-G formula based on SCr of 2.52 mg/dL (H)). Liver Function Tests: Recent Labs  Lab 10/22/19 1214 10/26/19 1352  AST 16 35  ALT 19 27  ALKPHOS 73 72  BILITOT 0.8 0.8  PROT 7.3 7.0  ALBUMIN 3.9 3.2*   Recent Labs  Lab 10/26/19 1352  LIPASE 27   No results for input(s): AMMONIA in the last 168 hours. Coagulation Profile: Recent Labs  Lab 10/22/19 1214  INR 1.0   Cardiac Enzymes: No results for input(s): CKTOTAL, CKMB, CKMBINDEX, TROPONINI in the last 168 hours. BNP (last 3 results) No results for input(s): PROBNP in the last 8760 hours. HbA1C: No results for input(s): HGBA1C in the last 72 hours. CBG: No results for input(s): GLUCAP in the last 168 hours. Lipid Profile: No results for  input(s): CHOL, HDL, LDLCALC, TRIG, CHOLHDL, LDLDIRECT in the last 72 hours. Thyroid Function Tests: No results for input(s):  TSH, T4TOTAL, FREET4, T3FREE, THYROIDAB in the last 72 hours. Anemia Panel: No results for input(s): VITAMINB12, FOLATE, FERRITIN, TIBC, IRON, RETICCTPCT in the last 72 hours. Urine analysis: No results found for: COLORURINE, APPEARANCEUR, LABSPEC, PHURINE, GLUCOSEU, HGBUR, BILIRUBINUR, KETONESUR, PROTEINUR, UROBILINOGEN, NITRITE, LEUKOCYTESUR  Radiological Exams on Admission: No results found.  EKG: Independently reviewed.   Assessment/Plan Principal Problem:   Diarrhea Active Problems:   AKI (acute kidney injury) ([place])   Hypokalemia   Hypotension   Essential hypertension   Leukocytosis   Hyperlipidemia   COPD (chronic obstructive pulmonary disease) ([facility])   Gastroesophageal reflux disease   Seasonal allergies   Hypothyroidism   Diarrhea Nausea Presentation, leukocytosis, acute renal failure in the setting of recent antibiotic use prior to onset is most concerning for C. difficile infection.  Differential also includes infectious vs ischemic vs inflammatory colitis.  FOBT positive, however doubt this is a true GI bleed, more likely colitis. -C. difficile pending -GI pathogen panel pending -IV hydration, boluses as needed for BP support -Enteric precautions for now -Diet as tolerated -Zofran as needed -Will hold off on starting antibiotics until C. difficile result  Acute kidney injury  Likely prerenal in the setting of hypotension and diarrhea.  ATN the possibility. - IV hydration as above -Monitor BMP -Renally dose meds as indicated -Avoid nephrotoxic agents  Hypokalemia - due to diarrhea  Hypotension - due to diarrhea  Leukocytosis -due to diarrhea, supports infectious process -Monitor CBC -Monitor for fevers  Essential hypertension  Hypotensive on admission as above -Hold Cozaar, amlodipine, metoprolol  ?  Type 2 diabetes Does not appear to be on medications outpatient.  Glucose on BMP 157. -Monitor blood glucose -A1c in the morning -Sensitive sliding  scale insulin  Hyperlipidemia -continue home Lipitor  COPD -not acutely exacerbated -Does not appear to be on inhalers at home  GERD -, stable -Continue home PPI  Seasonal allergies -Continue home Flonase and stop loratadine for Zyrtec  Hypothyroidism -Continue levothyroxine   DVT prophylaxis: SCDs (FOBT positive) Code Status: Full Family Communication: None at bedside Disposition Plan: Expect discharge home in 24 to 48 hours, pending clinical improvement Consults called: none Admission status: obs  Ezekiel Slocumb, DO [facility] Pager [phone]  If 7PM-7AM, please contact night-coverage [email or web address] Password [facility]  10/26/2019, 8:18 PM

## 2019-10-27 DIAGNOSIS — E785 Hyperlipidemia, unspecified: Secondary | ICD-10-CM | POA: Diagnosis present

## 2019-10-27 DIAGNOSIS — R6520 Severe sepsis without septic shock: Secondary | ICD-10-CM | POA: Diagnosis present

## 2019-10-27 DIAGNOSIS — A414 Sepsis due to anaerobes: Secondary | ICD-10-CM | POA: Diagnosis present

## 2019-10-27 DIAGNOSIS — Z8349 Family history of other endocrine, nutritional and metabolic diseases: Secondary | ICD-10-CM | POA: Diagnosis not present

## 2019-10-27 DIAGNOSIS — Z8249 Family history of ischemic heart disease and other diseases of the circulatory system: Secondary | ICD-10-CM | POA: Diagnosis not present

## 2019-10-27 DIAGNOSIS — A0472 Enterocolitis due to Clostridium difficile, not specified as recurrent: Secondary | ICD-10-CM | POA: Diagnosis present

## 2019-10-27 DIAGNOSIS — E119 Type 2 diabetes mellitus without complications: Secondary | ICD-10-CM | POA: Diagnosis present

## 2019-10-27 DIAGNOSIS — Z823 Family history of stroke: Secondary | ICD-10-CM | POA: Diagnosis not present

## 2019-10-27 DIAGNOSIS — H5462 Unqualified visual loss, left eye, normal vision right eye: Secondary | ICD-10-CM | POA: Diagnosis present

## 2019-10-27 DIAGNOSIS — Z87891 Personal history of nicotine dependence: Secondary | ICD-10-CM | POA: Diagnosis not present

## 2019-10-27 DIAGNOSIS — A498 Other bacterial infections of unspecified site: Secondary | ICD-10-CM | POA: Diagnosis not present

## 2019-10-27 DIAGNOSIS — Z79890 Hormone replacement therapy (postmenopausal): Secondary | ICD-10-CM | POA: Diagnosis not present

## 2019-10-27 DIAGNOSIS — N170 Acute kidney failure with tubular necrosis: Secondary | ICD-10-CM | POA: Diagnosis present

## 2019-10-27 DIAGNOSIS — Z20828 Contact with and (suspected) exposure to other viral communicable diseases: Secondary | ICD-10-CM | POA: Diagnosis present

## 2019-10-27 DIAGNOSIS — K219 Gastro-esophageal reflux disease without esophagitis: Secondary | ICD-10-CM | POA: Diagnosis present

## 2019-10-27 DIAGNOSIS — J449 Chronic obstructive pulmonary disease, unspecified: Secondary | ICD-10-CM | POA: Diagnosis present

## 2019-10-27 DIAGNOSIS — N179 Acute kidney failure, unspecified: Secondary | ICD-10-CM | POA: Diagnosis not present

## 2019-10-27 DIAGNOSIS — R197 Diarrhea, unspecified: Secondary | ICD-10-CM | POA: Diagnosis not present

## 2019-10-27 DIAGNOSIS — I959 Hypotension, unspecified: Secondary | ICD-10-CM | POA: Diagnosis present

## 2019-10-27 DIAGNOSIS — Z806 Family history of leukemia: Secondary | ICD-10-CM | POA: Diagnosis not present

## 2019-10-27 DIAGNOSIS — A09 Infectious gastroenteritis and colitis, unspecified: Secondary | ICD-10-CM | POA: Diagnosis not present

## 2019-10-27 DIAGNOSIS — E039 Hypothyroidism, unspecified: Secondary | ICD-10-CM | POA: Diagnosis present

## 2019-10-27 DIAGNOSIS — Z86011 Personal history of benign neoplasm of the brain: Secondary | ICD-10-CM | POA: Diagnosis not present

## 2019-10-27 DIAGNOSIS — I10 Essential (primary) hypertension: Secondary | ICD-10-CM | POA: Diagnosis present

## 2019-10-27 DIAGNOSIS — Z7982 Long term (current) use of aspirin: Secondary | ICD-10-CM | POA: Diagnosis not present

## 2019-10-27 DIAGNOSIS — J302 Other seasonal allergic rhinitis: Secondary | ICD-10-CM | POA: Diagnosis present

## 2019-10-27 DIAGNOSIS — E876 Hypokalemia: Secondary | ICD-10-CM | POA: Diagnosis present

## 2019-10-27 DIAGNOSIS — Z79899 Other long term (current) drug therapy: Secondary | ICD-10-CM | POA: Diagnosis not present

## 2019-10-27 LAB — CBC
HCT: 28.6 % — ABNORMAL LOW (ref 36.0–46.0)
HCT: 30.3 % — ABNORMAL LOW (ref 36.0–46.0)
Hemoglobin: 10 g/dL — ABNORMAL LOW (ref 12.0–15.0)
Hemoglobin: 9.6 g/dL — ABNORMAL LOW (ref 12.0–15.0)
MCH: 29.4 pg (ref 26.0–34.0)
MCH: 30 pg (ref 26.0–34.0)
MCHC: 33 g/dL (ref 30.0–36.0)
MCHC: 33.6 g/dL (ref 30.0–36.0)
MCV: 89.1 fL (ref 80.0–100.0)
MCV: 89.4 fL (ref 80.0–100.0)
Platelets: 268 10*3/uL (ref 150–400)
Platelets: 290 10*3/uL (ref 150–400)
RBC: 3.2 MIL/uL — ABNORMAL LOW (ref 3.87–5.11)
RBC: 3.4 MIL/uL — ABNORMAL LOW (ref 3.87–5.11)
RDW: 13 % (ref 11.5–15.5)
RDW: 13.1 % (ref 11.5–15.5)
WBC: 11.1 10*3/uL — ABNORMAL HIGH (ref 4.0–10.5)
WBC: 12.8 10*3/uL — ABNORMAL HIGH (ref 4.0–10.5)
nRBC: 0 % (ref 0.0–0.2)
nRBC: 0 % (ref 0.0–0.2)

## 2019-10-27 LAB — COMPREHENSIVE METABOLIC PANEL
ALT: 28 U/L (ref 0–44)
AST: 34 U/L (ref 15–41)
Albumin: 2.3 g/dL — ABNORMAL LOW (ref 3.5–5.0)
Alkaline Phosphatase: 50 U/L (ref 38–126)
Anion gap: 8 (ref 5–15)
BUN: 37 mg/dL — ABNORMAL HIGH (ref 6–20)
CO2: 21 mmol/L — ABNORMAL LOW (ref 22–32)
Calcium: 7.6 mg/dL — ABNORMAL LOW (ref 8.9–10.3)
Chloride: 108 mmol/L (ref 98–111)
Creatinine, Ser: 1.9 mg/dL — ABNORMAL HIGH (ref 0.44–1.00)
GFR calc Af Amer: 34 mL/min — ABNORMAL LOW (ref >60)
GFR calc non Af Amer: 29 mL/min — ABNORMAL LOW (ref >60)
Glucose, Bld: 155 mg/dL — ABNORMAL HIGH (ref 70–99)
Potassium: 2.8 mmol/L — ABNORMAL LOW (ref 3.5–5.1)
Sodium: 137 mmol/L (ref 135–145)
Total Bilirubin: 0.7 mg/dL (ref 0.3–1.2)
Total Protein: 5.3 g/dL — ABNORMAL LOW (ref 6.5–8.1)

## 2019-10-27 LAB — LACTIC ACID, PLASMA
Lactic Acid, Venous: 1.5 mmol/L (ref 0.5–1.9)
Lactic Acid, Venous: 1.5 mmol/L (ref 0.5–1.9)

## 2019-10-27 LAB — HEMOGLOBIN A1C
Hgb A1c MFr Bld: 6.3 % — ABNORMAL HIGH (ref 4.8–5.6)
Mean Plasma Glucose: 134.11 mg/dL

## 2019-10-27 LAB — HIV ANTIBODY (ROUTINE TESTING W REFLEX): HIV Screen 4th Generation wRfx: NONREACTIVE

## 2019-10-27 LAB — SARS CORONAVIRUS 2 (TAT 6-24 HRS): SARS Coronavirus 2: NEGATIVE

## 2019-10-27 MED ORDER — LACTATED RINGERS IV BOLUS
500.0000 mL | Freq: Once | INTRAVENOUS | Status: AC
  Administered 2019-10-27: 08:00:00 500 mL via INTRAVENOUS

## 2019-10-27 MED ORDER — POTASSIUM CHLORIDE CRYS ER 20 MEQ PO TBCR
40.0000 meq | EXTENDED_RELEASE_TABLET | ORAL | Status: AC
  Administered 2019-10-27 (×3): 40 meq via ORAL
  Filled 2019-10-27 (×3): qty 2

## 2019-10-27 MED ORDER — METRONIDAZOLE IN NACL 5-0.79 MG/ML-% IV SOLN
500.0000 mg | Freq: Three times a day (TID) | INTRAVENOUS | Status: DC
  Administered 2019-10-27 (×3): 500 mg via INTRAVENOUS
  Filled 2019-10-27 (×6): qty 100

## 2019-10-27 MED ORDER — HEPARIN SODIUM (PORCINE) 5000 UNIT/ML IJ SOLN
5000.0000 [IU] | Freq: Two times a day (BID) | INTRAMUSCULAR | Status: DC
  Administered 2019-10-27 – 2019-10-28 (×3): 5000 [IU] via SUBCUTANEOUS
  Filled 2019-10-27 (×3): qty 1

## 2019-10-27 MED ORDER — VANCOMYCIN 50 MG/ML ORAL SOLUTION
500.0000 mg | Freq: Four times a day (QID) | ORAL | Status: DC
  Administered 2019-10-27 – 2019-10-28 (×5): 500 mg via ORAL
  Filled 2019-10-27: qty 10
  Filled 2019-10-27: qty 40
  Filled 2019-10-27 (×6): qty 10
  Filled 2019-10-27: qty 40
  Filled 2019-10-27 (×4): qty 10

## 2019-10-27 NOTE — Progress Notes (Signed)
PROGRESS NOTE    Yolanda Gallegos  [id number] DOB: Apr 18, 1961 DOA: 10/26/2019  PCP: [name], MD    LOS - 0   Brief Narrative:  58 y.o. 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  Hypotensive on admission but MAPs maintained and responsive to IV fluid.  Labs showed hypokalemia (K 2.7), renal failure (Cr 2.52, from 0.72 on 11/30), leukocytosis (wbc 17.8k).  CT abdomen pelvis from 11/30 showed signs of colitis.  Stool studies were ordered including GI panel and C. Difficile.  C diff positive, started PO vancomycin and IV Flagyl for fulminant C diff given hypotension.    Subjective 12/5: Patient seen this morning, awake lying in bed.  No acute events reported.  She reports feeling somewhat better.  Did have about 10-12 watery bowel movements overnight and this morning.  Denies fevers or chills.  States nausea has resolved.  Asks about going home possibly tomorrow.  Assessment & Plan:   Principal Problem:   Diarrhea Active Problems:   AKI (acute kidney injury) ([place])   Hypokalemia   Hypotension   Essential hypertension   Leukocytosis   Hyperlipidemia   COPD (chronic obstructive pulmonary disease) ([facility])   Gastroesophageal reflux disease   Seasonal allergies   Hypothyroidism    Severe Sepsis secondary to C diff Fulminant C. Difficile Infection  As evidenced by leukocytosis, hypotension, acute renal failure, C diff antigen (+), PCR (+), toxin (-).  Initial C diff episode.  Diarrhea - due to above Nausea - due to above Clinical presentation, leukocytosis, acute renal failure in the setting of recent antibiotic use prior to onset is most concerning for C. difficile infection.  FOBT positive, however  doubt this is a true GI bleed, more likely colitis.   --started oral vancomycin and IV Flagyl per C diff order set -Plan to stop Flagyl once hypotension resolves -GI pathogen panel pending -IV hydration, boluses as needed for BP support   LR @ 125 cc/hr, will give 500cc bolus this AM -Enteric precautions  -Diet as tolerated -Zofran as needed --Lactic acid pending  Acute kidney injury  Likely prerenal in the setting of hypotension and diarrhea.  ATN the possibility. - IV hydration as above -Monitor BMP -Renally dose meds as indicated -Avoid nephrotoxic agents  Hypokalemia - due to diarrhea, repleted --monitor electrolytes --replete for goal K>4.0, Mg>2.0  Hypotension - due to diarrhea.  Maintaining MAPs for most part with IV fluid support as above.   --Monitor BP closely --Maintain MAP > 65   Leukocytosis -improving. Due to C. difficile infection -Monitor CBC -Monitor for fevers  Essential hypertension  Hypotensive on admission as above  -Hold Cozaar, amlodipine, metoprolol  ? Type 2 diabetes Does not appear to be on medications outpatient.  Glucose on BMP 157. -Monitor blood glucose -A1c pending -We will hold off on insulin CBG consistently over 140  Hyperlipidemia -continue home Lipitor  COPD -not acutely exacerbated -Does not appear to be on inhalers at home  GERD -, stable -Continue home PPI  Seasonal allergies -Continue home Flonase and stop loratadine for Zyrtec  Hypothyroidism -Continue levothyroxine  Inpatient status:  Patient requires inpatient status due to severity of illness due to C diff infection and associated hypotension and acute renal failure.  Patient requires ongoing  close monitoring and is at high risk for further deterioration.  I certify that it is my clinical judgment that the patient will require inpatient hospital care spanning beyond 2 midnights from the point of admission.     DVT prophylaxis: heparin   Code Status:  Full Code  Family Communication: none at bedside  Disposition Plan:  Expect d/c home in 1-2 more days on oral vancomycin, pending resolution of hypotension.   Consultants:   None  Procedures:   None  Antimicrobials:   Vancomycin 125 mg PO q6h - start 12/5, planned stop 12/19  Flagyl 500 mg IV q8h, start 12/5, plan to stop once hypotension resolves   Objective: Vitals:   10/26/19 2320 10/27/19 0212 10/27/19 0215 10/27/19 0407  BP: (!) 86/41 (!) 84/47 (!) 89/57 (!) 85/52  Pulse: 95 99 95 93  Resp:    16  Temp:    98.3 F (36.8 C)  TempSrc:    Oral  SpO2:    95%  Weight:      Height:        Intake/Output Summary (Last 24 hours) at 10/27/2019 0724 Last data filed at 10/27/2019 0400 Gross per 24 hour  Intake 2435.75 ml  Output -  Net 2435.75 ml   Filed Weights   10/26/19 1345 10/26/19 1350  Weight: 112 kg 112 kg    Examination:  General exam: awake, alert, no acute distress, obese HEENT: moist mucus membranes, hearing grossly normal  Respiratory system: clear to auscultation bilaterally, no wheezes, rales or rhonchi, normal respiratory effort. Cardiovascular system: normal S1/S2, RRR, no JVD, murmurs, rubs, gallops, no pedal edema.   Gastrointestinal system: soft, non-tender, non-distended abdomen, no organomegaly or masses felt, hyperactive bowel sounds. Central nervous system: alert and oriented x4. no gross focal neurologic deficits, normal speech Extremities: moves all, no edema, normal tone Skin: dry, intact, normal temperature Psychiatry: normal mood, congruent affect, judgement and insight appear normal    Data Reviewed: I have personally reviewed following labs and imaging studies  CBC: Recent Labs  Lab 10/22/19 1214 10/26/19 1352 10/26/19 2351  WBC 10.4 17.8* 12.8*  NEUTROABS 7.3  --   --   HGB 12.8 12.1 10.0*  HCT 40.2 34.9* 30.3*  MCV 94.1 85.1 89.1  PLT 270 337 [id number]   Basic Metabolic Panel: Recent Labs  Lab 10/22/19 1214 10/26/19 1352   NA 141 134*  K 3.3* 2.7*  CL 103 94*  CO2 27 24  GLUCOSE 117* 157*  BUN 11 43*  CREATININE 0.72 2.52*  CALCIUM 9.3 8.6*  MG  --  2.3   GFR: Estimated Creatinine Clearance: 31.6 mL/min (A) (by C-G formula based on SCr of 2.52 mg/dL (H)). Liver Function Tests: Recent Labs  Lab 10/22/19 1214 10/26/19 1352  AST 16 35  ALT 19 27  ALKPHOS 73 72  BILITOT 0.8 0.8  PROT 7.3 7.0  ALBUMIN 3.9 3.2*   Recent Labs  Lab 10/26/19 1352  LIPASE 27   No results for input(s): AMMONIA in the last 168 hours. Coagulation Profile: Recent Labs  Lab 10/22/19 1214  INR 1.0   Cardiac Enzymes: No results for input(s): CKTOTAL, CKMB, CKMBINDEX, TROPONINI in the last 168 hours. BNP (last 3 results) No results for input(s): PROBNP in the last 8760 hours. HbA1C: No results for input(s): HGBA1C in the last 72 hours. CBG: No results for input(s): GLUCAP in the last 168 hours. Lipid Profile: No results for input(s): CHOL, HDL, LDLCALC, TRIG, CHOLHDL, LDLDIRECT in the last 72  hours. Thyroid Function Tests: No results for input(s): TSH, T4TOTAL, FREET4, T3FREE, THYROIDAB in the last 72 hours. Anemia Panel: No results for input(s): VITAMINB12, FOLATE, FERRITIN, TIBC, IRON, RETICCTPCT in the last 72 hours. Sepsis Labs: No results for input(s): PROCALCITON, LATICACIDVEN in the last 168 hours.  Recent Results (from the past 240 hour(s))  C difficile quick scan w PCR reflex     Status: Abnormal   Collection Time: 10/26/19  3:12 PM   Specimen: STOOL  Result Value Ref Range Status   C Diff antigen POSITIVE (A) NEGATIVE Final   C Diff toxin NEGATIVE NEGATIVE Final   C Diff interpretation Results are indeterminate. See PCR results.  Final    Comment: Performed at [facility], [address] [place], [place] [address]  C. Diff by PCR, Reflexed     Status: Abnormal   Collection Time: 10/26/19  3:12 PM  Result Value Ref Range Status   Toxigenic C. Difficile by PCR POSITIVE (A) NEGATIVE  Final    Comment: Positive for toxigenic C. difficile with little to no toxin production. Only treat if clinical presentation suggests symptomatic illness. Performed at [facility], [place], [place] [address]   SARS CORONAVIRUS 2 (TAT 6-24 HRS) Nasopharyngeal Nasopharyngeal Swab     Status: None   Collection Time: 10/26/19  3:32 PM   Specimen: Nasopharyngeal Swab  Result Value Ref Range Status   SARS Coronavirus 2 NEGATIVE NEGATIVE Final    Comment: (NOTE) SARS-CoV-2 target nucleic acids are NOT DETECTED. The SARS-CoV-2 RNA is generally detectable in upper and lower respiratory specimens during the acute phase of infection. Negative results do not preclude SARS-CoV-2 infection, do not rule out co-infections with other pathogens, and should not be used as the sole basis for treatment or other patient management decisions. Negative results must be combined with clinical observations, patient history, and epidemiological information. The expected result is Negative. Fact Sheet for Patients: SugarRoll.be Fact Sheet for Healthcare Providers: https://www.woods-mathews.com/ This test is not yet approved or cleared by the [place] FDA and  has been authorized for detection and/or diagnosis of SARS-CoV-2 by FDA under an Emergency Use Authorization (EUA). This EUA will remain  in effect (meaning this test can be used) for the duration of the COVID-19 declaration under Section 56 4(b)(1) of the Act, 21 U.S.C. section 360bbb-3(b)(1), unless the authorization is terminated or revoked sooner. Performed at [facility], [place] [address], [place], [place] [address]          Radiology Studies: No results found.      Scheduled Meds: . aspirin EC  81 mg Oral Daily  . atorvastatin  40 mg Oral q1800  . fluticasone  2 spray Each Nare Daily  . levothyroxine  100 mcg Oral Q0600  . loratadine  10 mg Oral Daily  .  multivitamin with minerals  1 tablet Oral Daily  . pantoprazole  40 mg Oral Daily  . vancomycin  500 mg Oral Q6H   Continuous Infusions: . lactated ringers 100 mL/hr at 10/27/19 0400  . metronidazole       LOS: 0 days    Time spent: 30-35 minutes    Ezekiel Slocumb, DO [facility] Pager: [phone]  If 7PM-7AM, please contact night-coverage [email or web address] Password [facility] 10/27/2019, 7:24 AM

## 2019-10-27 NOTE — Progress Notes (Signed)
Physical Therapy Evaluation Patient Details Name: Yolanda Gallegos MRN: [id number] DOB: 03/20/1961 Today's Date: 10/27/2019   History of Present Illness   Yolanda Gallegos is a 58 y.o. 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  States stool has been intermittently with bright red blood.  Clinical Impression  Patient is using the commode before PT arrives. She is independent with bed mobility and transfers without AD. She is able to ambulate independently in her room without AD. She has WNL standing balance with independent ability to tandem stand, stand with eyes closed, stand with head turns and heel toe raise. She has no skilled PT needs at this time.     Follow Up Recommendations Other (comment)    Equipment Recommendations  (no follow up is needed)    Recommendations for Other Services       Precautions / Restrictions        Mobility  Bed Mobility Overal bed mobility: Independent                Transfers Overall transfer level: Independent                  Ambulation/Gait Ambulation/Gait assistance: Independent Gait Distance (Feet): (room distances due to C-diff and IV) Assistive device: None          Stairs            Wheelchair Mobility    Modified Rankin (Stroke Patients Only)       Balance Overall balance assessment: Independent                               Standardized Balance Assessment Standardized Balance Assessment : (tandem stand I, eyes closed I, feet together I)           Pertinent Vitals/Pain Pain Assessment: No/denies pain    Home Living Family/patient expects to be discharged to:: Private residence Living Arrangements: Children Available Help at Discharge: Family Type of Home: House Home Access: Ramped entrance               Prior Function Level of Independence: Independent               Hand Dominance        Extremity/Trunk Assessment   Upper Extremity Assessment Upper Extremity Assessment: Overall WFL for tasks assessed    Lower Extremity Assessment Lower Extremity Assessment: Overall WFL for tasks assessed       Communication   Communication: No difficulties  Cognition Arousal/Alertness: Awake/alert Behavior During Therapy: WFL for tasks assessed/performed Overall Cognitive Status: Within Functional Limits for tasks assessed                                        General Comments      Exercises     Assessment/Plan    PT Assessment Patent does not need any further PT services  PT Problem List         PT Treatment Interventions      PT Goals (Current goals can be found in the Care Plan section)       Frequency     Barriers to discharge        Co-evaluation  AM-PAC PT "6 Clicks" Mobility  Outcome Measure Help needed turning from your back to your side while in a flat bed without using bedrails?: None Help needed moving from lying on your back to sitting on the side of a flat bed without using bedrails?: None Help needed moving to and from a bed to a chair (including a wheelchair)?: None Help needed standing up from a chair using your arms (e.g., wheelchair or bedside chair)?: None Help needed to walk in hospital room?: None Help needed climbing 3-5 steps with a railing? : None 6 Click Score: 24    End of Session   Activity Tolerance: Patient tolerated treatment well Patient left: in bed Nurse Communication: Mobility status      Time: 1445-1500 PT Time Calculation (min) (ACUTE ONLY): 15 min   Charges:   PT Evaluation $PT Eval Low Complexity: 1 Low            Jarquavious Fentress S, PT DPt 10/27/2019, 3:15 PM

## 2019-10-27 NOTE — Care Management Obs Status (Signed)
[facility] NOTIFICATION   Patient Details  Name: Yolanda Gallegos MRN: [id number] Date of Birth: 1961-04-13   Medicare Observation Status Notification Given:  Yes    Truitt Merle, LCSW 10/27/2019, 9:52 AM

## 2019-10-27 NOTE — Progress Notes (Signed)
MD paged via Amion and notified of *positive* C-Diff result

## 2019-10-28 DIAGNOSIS — I10 Essential (primary) hypertension: Secondary | ICD-10-CM

## 2019-10-28 DIAGNOSIS — A09 Infectious gastroenteritis and colitis, unspecified: Secondary | ICD-10-CM

## 2019-10-28 LAB — CBC WITH DIFFERENTIAL/PLATELET
Abs Immature Granulocytes: 0.58 10*3/uL — ABNORMAL HIGH (ref 0.00–0.07)
Basophils Absolute: 0.1 10*3/uL (ref 0.0–0.1)
Basophils Relative: 1 %
Eosinophils Absolute: 0.5 10*3/uL (ref 0.0–0.5)
Eosinophils Relative: 5 %
HCT: 27.1 % — ABNORMAL LOW (ref 36.0–46.0)
Hemoglobin: 9.2 g/dL — ABNORMAL LOW (ref 12.0–15.0)
Immature Granulocytes: 6 %
Lymphocytes Relative: 18 %
Lymphs Abs: 1.9 10*3/uL (ref 0.7–4.0)
MCH: 29.5 pg (ref 26.0–34.0)
MCHC: 33.9 g/dL (ref 30.0–36.0)
MCV: 86.9 fL (ref 80.0–100.0)
Monocytes Absolute: 1 10*3/uL (ref 0.1–1.0)
Monocytes Relative: 10 %
Neutro Abs: 6.5 10*3/uL (ref 1.7–7.7)
Neutrophils Relative %: 60 %
Platelets: 280 10*3/uL (ref 150–400)
RBC: 3.12 MIL/uL — ABNORMAL LOW (ref 3.87–5.11)
RDW: 13.3 % (ref 11.5–15.5)
WBC: 10.5 10*3/uL (ref 4.0–10.5)
nRBC: 0 % (ref 0.0–0.2)

## 2019-10-28 LAB — BASIC METABOLIC PANEL
Anion gap: 9 (ref 5–15)
BUN: 18 mg/dL (ref 6–20)
CO2: 21 mmol/L — ABNORMAL LOW (ref 22–32)
Calcium: 8.1 mg/dL — ABNORMAL LOW (ref 8.9–10.3)
Chloride: 110 mmol/L (ref 98–111)
Creatinine, Ser: 1.03 mg/dL — ABNORMAL HIGH (ref 0.44–1.00)
GFR calc Af Amer: >60 mL/min (ref >60)
GFR calc non Af Amer: >60 mL/min (ref >60)
Glucose, Bld: 122 mg/dL — ABNORMAL HIGH (ref 70–99)
Potassium: 3.3 mmol/L — ABNORMAL LOW (ref 3.5–5.1)
Sodium: 140 mmol/L (ref 135–145)

## 2019-10-28 LAB — MAGNESIUM: Magnesium: 1.8 mg/dL (ref 1.7–2.4)

## 2019-10-28 MED ORDER — VANCOMYCIN 50 MG/ML ORAL SOLUTION: 125.0000 mg | Freq: Four times a day (QID) | ORAL | 0 refills | Status: AC

## 2019-10-28 MED ORDER — POTASSIUM CHLORIDE CRYS ER 20 MEQ PO TBCR: 40.0000 meq | EXTENDED_RELEASE_TABLET | Freq: Once | ORAL | Status: DC

## 2019-10-28 NOTE — Discharge Summary (Signed)
Physician Discharge Summary  [name] OBS:[id number] DOB: [birth year] DOA: 10/26/2019  PCP: [name], MD  Admit date: 10/26/2019 Discharge date: 10/28/2019  Admitted From: Home Disposition:  Home  Recommendations for Outpatient Follow-up:  1. Follow up with PCP in 1-2 weeks 2. Please obtain BMP/CBC in one week   Home Health: No  Equipment/Devices: None   Discharge Condition: Stable  CODE STATUS: Full  Diet recommendation: Heart Healthy  Brief/Interim Summary:  58 y.o.femalewith medical history significant oftype 2diabetes, blindness in her left eyes/p meningioma resection, hypertension, hyperlipidemia, GERD, hypothyroidismwho presents with dizziness and persistent diarrhea for past 2 weeks in setting of recently completing 14 days of antibiotics for sinus infection prior to onset.  Hypotensive on admission but MAPs maintained and responsive to IV fluid.  Labs showed hypokalemia (K 2.7), renal failure (Cr 2.52, from 0.72 on 11/30), leukocytosis (wbc 17.8k).  CT abdomen pelvis from 11/30 showed signs of colitis. Stool studies were ordered including GI panel and C. Difficile.  C diff positive, started PO vancomycin and IV Flagyl for fulminant C diff given hypotension. Patient's blood pressure improved with aggressive IV fluid repletion.  Normotensive today with BP medications still on hold.  Stools became much less frequent and no longer watery, partially formed but loose.  IV Flagyl discontinued. Patient clinically improved and hemodynamically stable for discharge home today, to complete course of oral Vancomycin.  Advised she continue to hold antihypertensives, monitor blood pressure at home, and can resume BP medications if it's gets up over 140/90.  Counseled patient about precautions at home, importance of frequent hand washing with soap and water.  Patient agreeable with the plan and close PCP follow up.   Discharge Diagnoses: Principal Problem:   Diarrhea Active  Problems:   AKI (acute kidney injury) ([facility])   Hypokalemia   Hypotension   Essential hypertension   Leukocytosis   Hyperlipidemia   COPD (chronic obstructive pulmonary disease) (HCC)   Gastroesophageal reflux disease   Seasonal allergies   Hypothyroidism   Clostridioides difficile infection  Severe Sepsis secondary to C diff Fulminant C. Difficile Infection  As evidenced by leukocytosis, hypotension, acute renal failure, C diff antigen (+), PCR (+), toxin (-).  Initial C diff episode.  Diarrhea - due to above Nausea - due to above Clinical presentation, leukocytosis, acute renal failure in the setting of recent antibiotic use prior to onset is most concerning for C. difficile infection. FOBT positive, however doubt this is a true GI bleed, more likely colitis.   --continue oral vancomycin for total 10 day course --stop IV Flagyl since hypotension resolved and diarrhea improving -GI pathogen panel pending -stop IV fluids and monitor BP -Enteric precautions  -Diet as tolerated -Zofran as needed --Lactic acid pending  Acute kidney injury Likely prerenal in the setting of hypotension and diarrhea. ATN the possibility. -IV hydration as above -Monitor BMP -Renally dose meds as indicated -Avoid nephrotoxic agents  Hypokalemia-due to diarrhea, repleted --monitor electrolytes --replete for goal K>4.0, Mg>2.0  Hypotension-due to diarrhea.  Maintaining MAPs for most part with IV fluid support as above.   --Monitor BP closely --Maintain MAP > 65   Leukocytosis-improving. Due to C. difficile infection -Monitor CBC -Monitor for fevers  Essential hypertension Hypotensive on admission as above  -Hold Cozaar, amlodipine, metoprolol  ? Type 2 diabetes Does not appear to be on medications outpatient. Glucose on BMP 157. -Monitor blood glucose -A1c pending -We will hold off on insulin CBG consistently over 140  Hyperlipidemia-continue home  Lipitor  COPD-not acutely exacerbated -Does not appear to be on inhalers at home  GERD-, stable -Continue home PPI  Seasonal allergies -Continue home Flonase and stop loratadine for Zyrtec  Hypothyroidism -Continue levothyroxine   Discharge Instructions   Discharge Instructions    Call MD for:  temperature >100.4   Complete by: As directed    Diet - low sodium heart healthy   Complete by: As directed    Discharge instructions   Complete by: As directed    Take Vancomycin 4 times daily for another 9 days.   Call your Primary Care Doctor if watery diarrhea returns.  Please stay off your blood pressure medications for now.   Monitor your blood pressure at home, and you can resume taking blood pressure medicines if BP gets over 140/90. Follow up with Primary Care Doctor if any questions.   Increase activity slowly   Complete by: As directed      Allergies as of 10/28/2019      Reactions   Doxycycline Hives, Shortness Of Breath   Mobic [meloxicam]    "feeling of paralysis"   Glyxambi [empagliflozin-linagliptin] Itching   Sulfa Antibiotics    Per positive allergy test      Medication List    STOP taking these medications   amLODipine 5 MG tablet Commonly known as: NORVASC   losartan 100 MG tablet Commonly known as: COZAAR   metoprolol succinate 50 MG 24 hr tablet Commonly known as: TOPROL-XL     TAKE these medications   Accu-Chek Aviva Plus test strip Generic drug: glucose blood TEST BLOOD SUGAR TWO TO THREE TIMES DAILY(SCHEDULE APPOINTMENT FOR FUTURE REFILLS)   Accu-Chek Aviva Plus w/Device Kit TEST BLOOD SUGAR TWICE DAILY TO THREE TIMES DAILY   Accu-Chek Softclix Lancets lancets USE AS INSTRUCTED   acetaminophen 500 MG tablet Commonly known as: TYLENOL Take 1,000 mg by mouth 3 (three) times daily as needed for moderate pain or headache.   ALPRAZolam 0.5 MG tablet Commonly known as: Xanax Take 1 tablet (0.5 mg total) by mouth 3 (three) times  daily as needed for anxiety or sleep.   aspirin EC 81 MG tablet Take 1 tablet (81 mg total) by mouth daily.   atorvastatin 80 MG tablet Commonly known as: LIPITOR TAKE 1 TABLET DAILY (DISCONTINUE ATORVASTATIN 40MG) What changed:   how much to take  how to take this  when to take this   B-D SINGLE USE SWABS REGULAR Pads USE AS DIRECTED AS NEEDED FOR FINGERSTICK AND INSULIN INJECTION (NEEDS APPOINTMENT FOR FUTURE REFILLS)   fluticasone 50 MCG/ACT nasal spray Commonly known as: FLONASE USE 2 SPRAYS IN EACH NOSTRIL EVERY DAY   hydrocortisone 2.5 % cream Apply topically 2 (two) times daily.   levothyroxine 100 MCG tablet Commonly known as: SYNTHROID TAKE 1 TABLET EVERY DAY BEFORE BREAKFAST What changed:   how much to take  how to take this  when to take this   Magnesium 400 MG Tabs Take 400 mg by mouth 2 (two) times daily.   multivitamin with minerals Tabs tablet Take 1 tablet by mouth daily.   omeprazole 20 MG capsule Commonly known as: PRILOSEC TAKE 1 CAPSULE EVERY DAY   potassium chloride SA 20 MEQ tablet Commonly known as: KLOR-CON TAKE 2 TABLETS EVERY DAY What changed: how much to take   triamcinolone cream 0.5 % Commonly known as: KENALOG Apply 1 application topically 3 (three) times daily.   vancomycin 50 mg/mL  oral solution Commonly known as: VANCOCIN Take 2.5 mLs (  125 mg total) by mouth every 6 (six) hours for 9 days.   ZyrTEC Allergy 10 MG tablet Generic drug: cetirizine Take 10 mg by mouth daily.       Allergies  Allergen Reactions  . Doxycycline Hives and Shortness Of Breath  . Mobic [Meloxicam]     "feeling of paralysis"  . Glyxambi [Empagliflozin-Linagliptin] Itching  . Sulfa Antibiotics     Per positive allergy test    Consultations:  None    Procedures/Studies: Ct Abdomen Pelvis W Contrast  Result Date: 10/22/2019 CLINICAL DATA:  Right red blood bowel movements EXAM: CT ABDOMEN AND PELVIS WITH CONTRAST TECHNIQUE:  Multidetector CT imaging of the abdomen and pelvis was performed using the standard protocol following bolus administration of intravenous contrast. CONTRAST:  131m OMNIPAQUE IOHEXOL 300 MG/ML  SOLN COMPARISON:  None. FINDINGS: Lower chest: Lung bases demonstrate atelectasis or scar at the left base. No consolidation or pleural effusion. Heart size within normal limits. Small hiatal hernia. Hepatobiliary: Hepatic cysts, with the largest visualized in the right lobe measuring 5 cm. Additional subcentimeter hypodensities, too small to further characterize. No calcified gallstone or biliary dilatation Pancreas: Unremarkable. No pancreatic ductal dilatation or surrounding inflammatory changes. Spleen: Normal in size without focal abnormality. Adrenals/Urinary Tract: Adrenal glands are unremarkable. Kidneys show no hydronephrosis. Subcentimeter hypodensity mid right kidney, too small to further characterize. The bladder is unremarkable Stomach/Bowel: The stomach is nonenlarged. No dilated small bowel. Negative appendix. Hyperdense material within the right colon, on limited delayed views, does not appear to augment. Featureless appearing splenic flexure, descending and rectosigmoid colon with mild wall thickening. Mild edema/fat stranding in the pericolonic fat. Vascular/Lymphatic: Nonaneurysmal aorta. Mild to moderate aortic atherosclerosis. No significant adenopathy. Reproductive: Status post hysterectomy. No adnexal masses. Other: Negative for free air or free fluid Musculoskeletal: No acute or suspicious osseous abnormality IMPRESSION: 1. Featureless appearing splenic flexure, descending and rectosigmoid colon with mild wall thickening and mild edema/fat stranding in the pericolonic fat, findings could relate to a nonspecific colitis of infectious, inflammatory, or ischemic etiology. Negative for free air. 2. Small amount of hyperdense material within the right colon, suspected to represent ingested material over  hemorrhagic product. 3. Hepatic cysts. Additional subcentimeter hypodensities in the liver and right kidney, too small to further characterize Aortic Atherosclerosis (ICD10-I70.0). Electronically Signed   By: KDonavan FoilM.D.   On: 10/22/2019 16:11     Subjective: Patient awake in bed when seen this AM.  Reports only 5-6 stools overnight and this morning, about 1/2 as frequent as 24 hours ago.  Stool becoming more formed, no longer water.  No fever/chills.  No more nausea.     Discharge Exam: Vitals:   10/28/19 1319 10/28/19 1414  BP: 134/82 135/84  Pulse: (!) 115 (!) 110  Resp:    Temp:    SpO2: 100% 99%   Vitals:   10/28/19 0508 10/28/19 1112 10/28/19 1319 10/28/19 1414  BP: (!) 109/56 125/85 134/82 135/84  Pulse: 99 (!) 115 (!) 115 (!) 110  Resp: 16     Temp: 97.7 F (36.5 C)     TempSrc: Oral     SpO2: 95% 97% 100% 99%  Weight:      Height:        General: Pt is alert, awake, not in acute distress Cardiovascular: RRR, S1/S2 +, no rubs, no gallops Respiratory: CTA bilaterally, no wheezing, no rhonchi Abdominal: Soft, NT, ND, bowel sounds + Extremities: no edema, no cyanosis    The results of  significant diagnostics from this hospitalization (including imaging, microbiology, ancillary and laboratory) are listed below for reference.     Microbiology: Recent Results (from the past 240 hour(s))  C difficile quick scan w PCR reflex     Status: Abnormal   Collection Time: 10/26/19  3:12 PM   Specimen: STOOL  Result Value Ref Range Status   C Diff antigen POSITIVE (A) NEGATIVE Final   C Diff toxin NEGATIVE NEGATIVE Final   C Diff interpretation Results are indeterminate. See PCR results.  Final    Comment: Performed at [facility], [address] [place], [place] [address]  C. Diff by PCR, Reflexed     Status: Abnormal   Collection Time: 10/26/19  3:12 PM  Result Value Ref Range Status   Toxigenic C. Difficile by PCR POSITIVE (A) NEGATIVE Final     Comment: Positive for toxigenic C. difficile with little to no toxin production. Only treat if clinical presentation suggests symptomatic illness. Performed at [facility], [place], [place] [address]   SARS CORONAVIRUS 2 (TAT 6-24 HRS) Nasopharyngeal Nasopharyngeal Swab     Status: None   Collection Time: 10/26/19  3:32 PM   Specimen: Nasopharyngeal Swab  Result Value Ref Range Status   SARS Coronavirus 2 NEGATIVE NEGATIVE Final    Comment: (NOTE) SARS-CoV-2 target nucleic acids are NOT DETECTED. The SARS-CoV-2 RNA is generally detectable in upper and lower respiratory specimens during the acute phase of infection. Negative results do not preclude SARS-CoV-2 infection, do not rule out co-infections with other pathogens, and should not be used as the sole basis for treatment or other patient management decisions. Negative results must be combined with clinical observations, patient history, and epidemiological information. The expected result is Negative. Fact Sheet for Patients: SugarRoll.be Fact Sheet for Healthcare Providers: https://www.woods-mathews.com/ This test is not yet approved or cleared by the [place] FDA and  has been authorized for detection and/or diagnosis of SARS-CoV-2 by FDA under an Emergency Use Authorization (EUA). This EUA will remain  in effect (meaning this test can be used) for the duration of the COVID-19 declaration under Section 56 4(b)(1) of the Act, 21 U.S.C. section 360bbb-3(b)(1), unless the authorization is terminated or revoked sooner. Performed at [facility], [place] [address] [place], [place] [address]      Labs: BNP (last 3 results) No results for input(s): BNP in the last 8760 hours. Basic Metabolic Panel: Recent Labs  Lab 10/22/19 1214 10/26/19 1352 10/27/19 0641 10/28/19 0352  NA 141 134* 137 140  K 3.3* 2.7* 2.8* 3.3*  CL 103 94* 108 110  CO2 27 24  21* 21*  GLUCOSE 117* 157* 155* 122*  BUN 11 43* 37* 18  CREATININE 0.72 2.52* 1.90* 1.03*  CALCIUM 9.3 8.6* 7.6* 8.1*  MG  --  2.3  --  1.8   Liver Function Tests: Recent Labs  Lab 10/22/19 1214 10/26/19 1352 10/27/19 0641  AST 16 35 34  ALT '19 27 28  ' ALKPHOS 73 72 50  BILITOT 0.8 0.8 0.7  PROT 7.3 7.0 5.3*  ALBUMIN 3.9 3.2* 2.3*   Recent Labs  Lab 10/26/19 1352  LIPASE 27   No results for input(s): AMMONIA in the last 168 hours. CBC: Recent Labs  Lab 10/22/19 1214 10/26/19 1352 10/26/19 2351 10/27/19 0641 10/28/19 0352  WBC 10.4 17.8* 12.8* 11.1* 10.5  NEUTROABS 7.3  --   --   --  6.5  HGB 12.8 12.1 10.0* 9.6*  9.2*  HCT 40.2 34.9* 30.3* 28.6* 27.1*  MCV 94.1 85.1 89.1 89.4 86.9  PLT 270 337 290 268 280   Cardiac Enzymes: No results for input(s): CKTOTAL, CKMB, CKMBINDEX, TROPONINI in the last 168 hours. BNP: Invalid input(s): POCBNP CBG: No results for input(s): GLUCAP in the last 168 hours. D-Dimer No results for input(s): DDIMER in the last 72 hours. Hgb A1c Recent Labs    10/27/19 0641  HGBA1C 6.3*   Lipid Profile No results for input(s): CHOL, HDL, LDLCALC, TRIG, CHOLHDL, LDLDIRECT in the last 72 hours. Thyroid function studies No results for input(s): TSH, T4TOTAL, T3FREE, THYROIDAB in the last 72 hours.  Invalid input(s): FREET3 Anemia work up No results for input(s): VITAMINB12, FOLATE, FERRITIN, TIBC, IRON, RETICCTPCT in the last 72 hours. Urinalysis    Component Value Date/Time   COLORURINE YELLOW (A) 10/26/2019 1352   APPEARANCEUR CLOUDY (A) 10/26/2019 1352   LABSPEC 1.013 10/26/2019 1352   PHURINE 5.0 10/26/2019 1352   GLUCOSEU NEGATIVE 10/26/2019 1352   HGBUR NEGATIVE 10/26/2019 1352   BILIRUBINUR NEGATIVE 10/26/2019 1352   KETONESUR NEGATIVE 10/26/2019 1352   PROTEINUR NEGATIVE 10/26/2019 1352   NITRITE NEGATIVE 10/26/2019 1352   LEUKOCYTESUR NEGATIVE 10/26/2019 1352   Sepsis Labs Invalid input(s): PROCALCITONIN,  WBC,   LACTICIDVEN Microbiology Recent Results (from the past 240 hour(s))  C difficile quick scan w PCR reflex     Status: Abnormal   Collection Time: 10/26/19  3:12 PM   Specimen: STOOL  Result Value Ref Range Status   C Diff antigen POSITIVE (A) NEGATIVE Final   C Diff toxin NEGATIVE NEGATIVE Final   C Diff interpretation Results are indeterminate. See PCR results.  Final    Comment: Performed at [facility], [address] [place], [place] [address]  C. Diff by PCR, Reflexed     Status: Abnormal   Collection Time: 10/26/19  3:12 PM  Result Value Ref Range Status   Toxigenic C. Difficile by PCR POSITIVE (A) NEGATIVE Final    Comment: Positive for toxigenic C. difficile with little to no toxin production. Only treat if clinical presentation suggests symptomatic illness. Performed at [facility], [place], [place] [address]   SARS CORONAVIRUS 2 (TAT 6-24 HRS) Nasopharyngeal Nasopharyngeal Swab     Status: None   Collection Time: 10/26/19  3:32 PM   Specimen: Nasopharyngeal Swab  Result Value Ref Range Status   SARS Coronavirus 2 NEGATIVE NEGATIVE Final    Comment: (NOTE) SARS-CoV-2 target nucleic acids are NOT DETECTED. The SARS-CoV-2 RNA is generally detectable in upper and lower respiratory specimens during the acute phase of infection. Negative results do not preclude SARS-CoV-2 infection, do not rule out co-infections with other pathogens, and should not be used as the sole basis for treatment or other patient management decisions. Negative results must be combined with clinical observations, patient history, and epidemiological information. The expected result is Negative. Fact Sheet for Patients: SugarRoll.be Fact Sheet for Healthcare Providers: https://www.woods-mathews.com/ This test is not yet approved or cleared by the [place] FDA and  has been authorized for detection and/or diagnosis of  SARS-CoV-2 by FDA under an Emergency Use Authorization (EUA). This EUA will remain  in effect (meaning this test can be used) for the duration of the COVID-19 declaration under Section 56 4(b)(1) of the Act, 21 U.S.C. section 360bbb-3(b)(1), unless the authorization is terminated or revoked sooner. Performed at [facility], [place] [address], [place], [place] [address]  Time coordinating discharge: Over 30 minutes  SIGNED:   Ezekiel Slocumb, DO [facility] 10/28/2019, 2:37 PM Pager [phone]  If 7PM-7AM, please contact night-coverage [email or web address] Password [email or web address]

## 2019-10-31 ENCOUNTER — Other Ambulatory Visit: Payer: Self-pay

## 2019-10-31 LAB — GI PATHOGEN PANEL BY PCR, STOOL

## 2019-10-31 NOTE — Patient Outreach (Signed)
[facility]) Care Management  10/31/2019  MYRACLE KRAUSSE November 03, 1961 [id number]   EMMI- General Discharge RED ON EMMI ALERT Day # 1 Date: 10/30/2019 Red Alert Reason:  Scheduled follow up appointment? No  Outreach attempt: Spoke with patient. She reports she is doing good.  Addressed red alert.  Patient reports that she has an appointment with her physician on Monday and she has transportation.  Patient has all her medications prescribed and able to afford medications.  Patient declines any further questions or nurse follow up.    Plan: RN CM will close case.   Jone Baseman, RN, MSN [facility] Care Management Care Management Coordinator Direct Line [phone] Toll Free: [phone]  Fax: [phone]

## 2019-11-05 ENCOUNTER — Encounter: Payer: Self-pay | Admitting: Family Medicine

## 2019-11-05 ENCOUNTER — Ambulatory Visit (INDEPENDENT_AMBULATORY_CARE_PROVIDER_SITE_OTHER): Payer: Medicare HMO | Admitting: Family Medicine

## 2019-11-05 ENCOUNTER — Other Ambulatory Visit: Payer: Self-pay

## 2019-11-05 VITALS — BP 102/70 | HR 104 | Temp 98.6°F | Resp 18 | Ht 66.0 in | Wt 249.0 lb

## 2019-11-05 DIAGNOSIS — Z09 Encounter for follow-up examination after completed treatment for conditions other than malignant neoplasm: Secondary | ICD-10-CM

## 2019-11-05 DIAGNOSIS — R000 Tachycardia, unspecified: Secondary | ICD-10-CM

## 2019-11-05 DIAGNOSIS — A0472 Enterocolitis due to Clostridium difficile, not specified as recurrent: Secondary | ICD-10-CM | POA: Diagnosis not present

## 2019-11-05 MED ORDER — SACCHAROMYCES BOULARDII 250 MG PO CAPS: 250.0000 mg | ORAL_CAPSULE | Freq: Two times a day (BID) | ORAL | 1 refills | Status: AC

## 2019-11-05 NOTE — Progress Notes (Signed)
Subjective:    Patient ID: Yolanda Gallegos, female    DOB: 1961/09/24, 58 y.o.   MRN: [id number]  HPI  Recently admitted to the hospital with severe diarrhea.  I have copied the DC summary below for reference:  Admit date: 10/26/2019 Discharge date: 10/28/2019  Admitted From: Home Disposition:  Home  Recommendations for Outpatient Follow-up:  1. Follow up with PCP in 1-2 weeks 2. 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 Hypotensive on admission but MAPs maintained and responsive to IV fluid. Labs showed hypokalemia (K 2.7), renal failure (Cr 2.52, from 0.72 on 11/30), leukocytosis (wbc 17.8k). CT abdomen pelvis from 11/30 showed signs of colitis. Stool studies were ordered including GI panel and C. Difficile.C diff positive, started PO vancomycin and IV Flagyl for fulminant C diff given hypotension.Patient's blood pressure improved with aggressive IV fluid repletion.  Normotensive today with BP medications still on hold.  Stools became much less frequent and no longer watery, partially formed but loose.  IV Flagyl discontinued. Patient clinically improved and hemodynamically stable for discharge home today, to complete course of oral Vancomycin.  Advised she continue to hold antihypertensives, monitor blood pressure at home, and can resume BP medications if it's gets up over 140/90.  Counseled patient about precautions at home, importance of frequent hand washing with soap and water.  Patient agreeable with  the plan and close PCP follow up.   Discharge Diagnoses: Principal Problem:   Diarrhea Active Problems:   AKI (acute kidney injury) ([place])   Hypokalemia   Hypotension   Essential hypertension   Leukocytosis   Hyperlipidemia   COPD (chronic obstructive pulmonary disease) (H[facility])   Gastroesophageal reflux disease   Seasonal allergies   Hypothyroidism   Clostridioides difficile infection  Severe Sepsis secondary to C diff Fulminant C. Difficile Infection  As evidenced by leukocytosis, hypotension, acute renal failure, C diff antigen (+), PCR (+), toxin (-). Initial C diff episode. Diarrhea- due to above Nausea- due to above Clinical presentation, leukocytosis, acute renal failure in the setting of recent antibiotic use prior to onset is most concerning for C. difficile infection. FOBT positive, however doubt this is a true GI bleed, more likely colitis. --continue oral vancomycin for total 10 day course --stop IV Flagyl since hypotension resolved and diarrhea improving -GI pathogen panel pending -stop IV fluids and monitor BP -Enteric precautions  -Diet as tolerated -Zofran as needed --Lactic acid pending  Acute kidney injury Likely prerenal in the setting of hypotension and diarrhea. ATN the possibility. -IV hydration as above -Monitor BMP -Renally dose meds as indicated -Avoid nephrotoxic agents  Hypokalemia-due to diarrhea, repleted --monitor electrolytes --replete for goal K>4.0, Mg>2.0  Hypotension-due to diarrhea. Maintaining MAPs for most part with IV fluid support as above.  --Monitor BP closely --Maintain MAP > 65   Leukocytosis-improving. Due toC. difficile infection -Monitor CBC -Monitor for fevers  Essential hypertension Hypotensive on admission as above  -Hold Cozaar, amlodipine, metoprolol  ? Type 2 diabetes Does not appear to be on medications outpatient. Glucose on BMP 157. -Monitor blood glucose -A1cpending -We  will hold off oninsulinCBG consistently over 140  Hyperlipidemia-continue home Lipitor  COPD-not acutely exacerbated -Does not appear to be on inhalers at home  GERD-, stable -Continue home PPI  Seasonal allergies -Continue home Flonase and stop loratadine for Zyrtec  Hypothyroidism -Continue levothyroxine   11/05/19 Developed C diff after antibiotics for recent sinus infection.  Patient states that she is still having diarrhea.  She is having watery stools 4-5 times a day.  She denies any blood in her stool.  She denies any abdominal pain however she still feels extremely weak and dizzy.  She is taking her metoprolol despite it not being on her medicine list however her heart rate today is extremely high.  On my exam she was 120 bpm and somewhat irregular.  She denies any chest pain or shortness of breath or dyspnea on exertion although she does report fatigue and lightheadedness.  Past Medical History:  Diagnosis Date  . Allergy   . Altered gait    USES CANE TO JUDGE DISTANCE DUE TO BLINDNESS IN LEFT EYE  . Colon polyps 2015  . COPD (chronic obstructive pulmonary disease) ([place])   . Diabetes mellitus without complication ([place])   . Emphysema of lung ([place])   . GERD (gastroesophageal reflux disease)   . Hyperlipidemia   . Hypertension   . Legally blind in left eye, as defined in [place] 2009  . Meningioma ([place]) 2009   on left side brain behind left eye/ bliindness left eye  . Multinodular goiter    subclinical hyperthyroidism  . Smoker   . Subclinical hyperthyroidism    due to multinodular goiter.  Confirmed on uptak escan 2015  . Tubular adenoma 2015   Past Surgical History:  Procedure Laterality Date  . ABDOMINAL HYSTERECTOMY  1999   left oopherectomy  . 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 BREAST BIOPSY Left 2007   benign  . [place]   3 times  . INNER EAR SURGERY Right 2001  . LEFT HEART CATH AND  CORONARY ANGIOGRAPHY N/A 04/22/2017   Procedure: Left Heart Cath and Coronary Angiography;  Surgeon: Nelva Bush, MD;  Location: [facility];  Service: Cardiovascular;  Laterality: N/A;   Current Outpatient Medications on File Prior to Visit  Medication Sig Dispense Refill  . ACCU-CHEK AVIVA PLUS test strip TEST BLOOD SUGAR TWO TO THREE TIMES DAILY(SCHEDULE APPOINTMENT FOR FUTURE REFILLS) 300 strip 0  . Accu-Chek Softclix Lancets lancets USE AS INSTRUCTED 300 each 0  . acetaminophen (TYLENOL) 500 MG tablet Take 1,000 mg by mouth 3 (three) times daily as needed for moderate pain or headache.     . Alcohol Swabs (B-D SINGLE USE SWABS REGULAR) PADS USE AS DIRECTED AS NEEDED FOR FINGERSTICK AND INSULIN INJECTION (NEEDS APPOINTMENT FOR FUTURE REFILLS) 300 each 0  . ALPRAZolam (XANAX) 0.5 MG tablet Take 1 tablet (0.5 mg total) by mouth 3 (three) times daily as needed for anxiety or sleep. 30 tablet 0  . aspirin EC 81 MG tablet Take 1 tablet (81 mg total) by mouth daily. 90 tablet 3  . atorvastatin (LIPITOR) 80 MG tablet TAKE 1 TABLET DAILY (DISCONTINUE ATORVASTATIN 40MG) 90 tablet 2  . Blood Glucose Monitoring Suppl (ACCU-CHEK AVIVA PLUS) w/Device KIT TEST BLOOD SUGAR TWICE DAILY TO THREE TIMES DAILY 1 kit 0  . fluticasone (FLONASE) 50 MCG/ACT nasal spray USE 2 SPRAYS IN EACH NOSTRIL EVERY DAY (Patient taking differently: Place 2 sprays into both nostrils daily. ) 48 g 3  .  hydrocortisone 2.5 % cream Apply topically 2 (two) times daily. 30 g 3  . levothyroxine (SYNTHROID, LEVOTHROID) 100 MCG tablet TAKE 1 TABLET EVERY DAY BEFORE BREAKFAST (Patient taking differently: Take 100 mcg by mouth daily before breakfast. TAKE 1 TABLET EVERY DAY BEFORE BREAKFAST) 90 tablet 3  . Magnesium 400 MG TABS Take 400 mg by mouth 2 (two) times daily.     . Multiple Vitamin (MULTIVITAMIN WITH MINERALS) TABS tablet Take 1 tablet by mouth daily.    Marland Kitchen omeprazole (PRILOSEC) 20 MG capsule TAKE 1 CAPSULE EVERY DAY  (Patient taking differently: Take 20 mg by mouth daily. ) 90 capsule 3  . potassium chloride SA (K-DUR) 20 MEQ tablet TAKE 2 TABLETS EVERY DAY (Patient taking differently: Take 20 mEq by mouth daily. ) 180 tablet 1  . triamcinolone cream (KENALOG) 0.5 % Apply 1 application topically 3 (three) times daily. 90 g 1  . vancomycin (VANCOCIN) 50 mg/mL oral solution Take 2.5 mLs (125 mg total) by mouth every 6 (six) hours for 9 days. 90 mL 0  . ZYRTEC ALLERGY 10 MG tablet Take 10 mg by mouth daily.      No current facility-administered medications on file prior to visit.   Allergies  Allergen Reactions  . Doxycycline Hives and Shortness Of Breath  . Mobic [Meloxicam]     "feeling of paralysis"  . Glyxambi [Empagliflozin-Linagliptin] Itching  . Sulfa Antibiotics     Per positive allergy test   Social History   Socioeconomic History  . Marital status: Widowed    Spouse name: Not on file  . Number of children: Not on file  . Years of education: Not on file  . Highest education level: Not on file  Occupational History  . Not on file  Tobacco Use  . Smoking status: Former Smoker    Packs/day: 1.50    Years: 30.00    Pack years: 45.00    Types: Cigarettes    Quit date: 2013    Years since quitting: 7.9  . Smokeless tobacco: Never Used  Substance and Sexual Activity  . Alcohol use: Yes    Alcohol/week: 1.0 standard drinks    Types: 1 Glasses of wine per week    Comment: one nightly cocktail  . Drug use: No  . Sexual activity: Not on file    Comment: married  Other Topics Concern  . Not on file  Social History Narrative  . Not on file   Social Determinants of Health   Financial Resource Strain:   . Difficulty of Paying Living Expenses: Not on file  Food Insecurity:   . Worried About Charity fundraiser in the Last Year: Not on file  . Ran Out of Food in the Last Year: Not on file  Transportation Needs:   . Lack of Transportation (Medical): Not on file  . Lack of  Transportation (Non-Medical): Not on file  Physical Activity:   . Days of Exercise per Week: Not on file  . Minutes of Exercise per Session: Not on file  Stress:   . Feeling of Stress : Not on file  Social Connections:   . Frequency of Communication with Friends and Family: Not on file  . Frequency of Social Gatherings with Friends and Family: Not on file  . Attends Religious Services: Not on file  . Active Member of Clubs or Organizations: Not on file  . Attends Archivist Meetings: Not on file  . Marital Status: Not on file  Intimate Partner Violence:   . Fear of Current or Ex-Partner: Not on file  . Emotionally Abused: Not on file  . Physically Abused: Not on file  . Sexually Abused: Not on file     Review of Systems  All other systems reviewed and are negative.      Objective:   Physical Exam Vitals reviewed.  Cardiovascular:     Rate and Rhythm: Tachycardia present. Rhythm irregular.     Heart sounds: Normal heart sounds.  Pulmonary:     Effort: Pulmonary effort is normal. No respiratory distress.     Breath sounds: Normal breath sounds. No wheezing or rales.  Abdominal:     General: Abdomen is flat. Bowel sounds are normal. There is no distension.     Palpations: Abdomen is soft.     Tenderness: There is no abdominal tenderness. There is no guarding.           Assessment & Plan:  Hospital discharge follow-up  C. difficile colitis - Plan: COMPLETE METABOLIC PANEL WITH GFR, CBC with Differential, Magnesium  Hypomagnesemia - Plan: Magnesium  Patient is tachycardic on exam.  I will obtain an EKG to evaluate further.  I will check a CMP to monitor the patient's electrolytes particularly potassium as well as her magnesium as she was deficient in these due to diarrhea.  Patient is still relatively hypotensive and therefore I will not have the patient resume her antihypertensives at the present time.  Continue the vancomycin 4 times daily at present.  The  patient is still symptomatic.  I will add Florastor 250 mg twice daily to try to replace the bacterial flora in the intestine.  EKG today shows normal sinus rhythm/sinus tachycardia with no evidence of atrial fibrillation.  I believe I was appreciating sinus arrhythmia or possible PVCs.  Patient appears dehydrated.  Therefore I have recommended that the patient push fluids to [place] bottles of Gatorade per day in addition to her normal diet.  Reassess on Friday.

## 2019-11-05 NOTE — Patient Outreach (Signed)
[facility]) Care Management  11/05/2019  CATILYN COVERDALE Apr 13, 1961 [id number]   EMMI- General Discharge RED ON EMMI ALERT Day # 4 Date:  11/05/2019 Red Alert Reason:  Other questions/problems? Yes  Outreach attempt: spoke with patient. She reports she is doing ok but still having some problems with diarrhea and darkened stools.  Patient reports that she has follow up with her physician today and will discuss with her physician. She is still on antibiotic treatment for C. Diff.  Discussed with patient C Diff and care.  She verbalized understanding and declined any further needs or nurse follow up.     Plan: RN CM will close case.    Jone Baseman, RN, MSN [facility] Care Management Care Management Coordinator Direct Line [phone] Toll Free: [phone]  Fax: [phone]

## 2019-11-06 LAB — COMPLETE METABOLIC PANEL WITH GFR
AG Ratio: 1.4 (calc) (ref 1.0–2.5)
ALT: 28 U/L (ref 6–29)
AST: 21 U/L (ref 10–35)
Albumin: 3.7 g/dL (ref 3.6–5.1)
Alkaline phosphatase (APISO): 66 U/L (ref 37–153)
BUN: 11 mg/dL (ref 7–25)
CO2: 27 mmol/L (ref 20–32)
Calcium: 8.1 mg/dL — ABNORMAL LOW (ref 8.6–10.4)
Chloride: 106 mmol/L (ref 98–110)
Creat: 0.8 mg/dL (ref 0.50–1.05)
GFR, Est African American: 96 mL/min/{1.73_m2} (ref >=60)
GFR, Est Non African American: 82 mL/min/{1.73_m2} (ref >=60)
Globulin: 2.7 g/dL (calc) (ref 1.9–3.7)
Glucose, Bld: 94 mg/dL (ref 65–99)
Potassium: 4.2 mmol/L (ref 3.5–5.3)
Sodium: 144 mmol/L (ref 135–146)
Total Bilirubin: 0.4 mg/dL (ref 0.2–1.2)
Total Protein: 6.4 g/dL (ref 6.1–8.1)

## 2019-11-06 LAB — CBC WITH DIFFERENTIAL/PLATELET
Absolute Monocytes: 889 cells/uL (ref 200–950)
Basophils Absolute: 106 cells/uL (ref 0–200)
Basophils Relative: 1.2 %
Eosinophils Absolute: 246 cells/uL (ref 15–500)
Eosinophils Relative: 2.8 %
HCT: 34.2 % — ABNORMAL LOW (ref 35.0–45.0)
Hemoglobin: 11.2 g/dL — ABNORMAL LOW (ref 11.7–15.5)
Lymphs Abs: 3159 cells/uL (ref 850–3900)
MCH: 29.8 pg (ref 27.0–33.0)
MCHC: 32.7 g/dL (ref 32.0–36.0)
MCV: 91 fL (ref 80.0–100.0)
MPV: 8.7 fL (ref 7.5–12.5)
Monocytes Relative: 10.1 %
Neutro Abs: 4400 cells/uL (ref 1500–7800)
Neutrophils Relative %: 50 %
Platelets: 492 10*3/uL — ABNORMAL HIGH (ref 140–400)
RBC: 3.76 10*6/uL — ABNORMAL LOW (ref 3.80–5.10)
RDW: 13 % (ref 11.0–15.0)
Total Lymphocyte: 35.9 %
WBC: 8.8 10*3/uL (ref 3.8–10.8)

## 2019-11-06 LAB — MAGNESIUM: Magnesium: 0.8 mg/dL — CL (ref 1.5–2.5)

## 2019-11-09 ENCOUNTER — Ambulatory Visit: Payer: Medicare HMO | Admitting: Family Medicine

## 2019-11-12 ENCOUNTER — Encounter: Payer: Self-pay | Admitting: Radiology

## 2019-11-12 ENCOUNTER — Ambulatory Visit (INDEPENDENT_AMBULATORY_CARE_PROVIDER_SITE_OTHER): Payer: Medicare HMO | Admitting: Family Medicine

## 2019-11-12 ENCOUNTER — Other Ambulatory Visit: Payer: Self-pay

## 2019-11-12 VITALS — BP 126/80 | HR 80 | Temp 97.1°F | Resp 18 | Ht 66.0 in | Wt 250.0 lb

## 2019-11-12 DIAGNOSIS — A0472 Enterocolitis due to Clostridium difficile, not specified as recurrent: Secondary | ICD-10-CM | POA: Diagnosis not present

## 2019-11-12 NOTE — Progress Notes (Signed)
Subjective:    Patient ID: Yolanda Gallegos, female    DOB: 1961/09/24, 58 y.o.   MRN: [id number]  HPI  Recently admitted to the hospital with severe diarrhea.  I have copied the DC summary below for reference:  Admit date: 10/26/2019 Discharge date: 10/28/2019  Admitted From: Home Disposition:  Home  Recommendations for Outpatient Follow-up:  1. Follow up with PCP in 1-2 weeks 2. 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 Hypotensive on admission but MAPs maintained and responsive to IV fluid. Labs showed hypokalemia (K 2.7), renal failure (Cr 2.52, from 0.72 on 11/30), leukocytosis (wbc 17.8k). CT abdomen pelvis from 11/30 showed signs of colitis. Stool studies were ordered including GI panel and C. Difficile.C diff positive, started PO vancomycin and IV Flagyl for fulminant C diff given hypotension.Patient's blood pressure improved with aggressive IV fluid repletion.  Normotensive today with BP medications still on hold.  Stools became much less frequent and no longer watery, partially formed but loose.  IV Flagyl discontinued. Patient clinically improved and hemodynamically stable for discharge home today, to complete course of oral Vancomycin.  Advised she continue to hold antihypertensives, monitor blood pressure at home, and can resume BP medications if it's gets up over 140/90.  Counseled patient about precautions at home, importance of frequent hand washing with soap and water.  Patient agreeable with  the plan and close PCP follow up.   Discharge Diagnoses: Principal Problem:   Diarrhea Active Problems:   AKI (acute kidney injury) ([place])   Hypokalemia   Hypotension   Essential hypertension   Leukocytosis   Hyperlipidemia   COPD (chronic obstructive pulmonary disease) (H[facility])   Gastroesophageal reflux disease   Seasonal allergies   Hypothyroidism   Clostridioides difficile infection  Severe Sepsis secondary to C diff Fulminant C. Difficile Infection  As evidenced by leukocytosis, hypotension, acute renal failure, C diff antigen (+), PCR (+), toxin (-). Initial C diff episode. Diarrhea- due to above Nausea- due to above Clinical presentation, leukocytosis, acute renal failure in the setting of recent antibiotic use prior to onset is most concerning for C. difficile infection. FOBT positive, however doubt this is a true GI bleed, more likely colitis. --continue oral vancomycin for total 10 day course --stop IV Flagyl since hypotension resolved and diarrhea improving -GI pathogen panel pending -stop IV fluids and monitor BP -Enteric precautions  -Diet as tolerated -Zofran as needed --Lactic acid pending  Acute kidney injury Likely prerenal in the setting of hypotension and diarrhea. ATN the possibility. -IV hydration as above -Monitor BMP -Renally dose meds as indicated -Avoid nephrotoxic agents  Hypokalemia-due to diarrhea, repleted --monitor electrolytes --replete for goal K>4.0, Mg>2.0  Hypotension-due to diarrhea. Maintaining MAPs for most part with IV fluid support as above.  --Monitor BP closely --Maintain MAP > 65   Leukocytosis-improving. Due toC. difficile infection -Monitor CBC -Monitor for fevers  Essential hypertension Hypotensive on admission as above  -Hold Cozaar, amlodipine, metoprolol  ? Type 2 diabetes Does not appear to be on medications outpatient. Glucose on BMP 157. -Monitor blood glucose -A1cpending -We  will hold off oninsulinCBG consistently over 140  Hyperlipidemia-continue home Lipitor  COPD-not acutely exacerbated -Does not appear to be on inhalers at home  GERD-, stable -Continue home PPI  Seasonal allergies -Continue home Flonase and stop loratadine for Zyrtec  Hypothyroidism -Continue levothyroxine   11/05/19 Developed C diff after antibiotics for recent sinus infection.  Patient states that she is still having diarrhea.  She is having watery stools 4-5 times a day.  She denies any blood in her stool.  She denies any abdominal pain however she still feels extremely weak and dizzy.  She is taking her metoprolol despite it not being on her medicine list however her heart rate today is extremely high.  On my exam she was 120 bpm and somewhat irregular.  She denies any chest pain or shortness of breath or dyspnea on exertion although she does report fatigue and lightheadedness.  At that time, my plan was: Patient is tachycardic on exam.  I will obtain an EKG to evaluate further.  I will check a CMP to monitor the patient's electrolytes particularly potassium as well as her magnesium as she was deficient in these due to diarrhea.  Patient is still relatively hypotensive and therefore I will not have the patient resume her antihypertensives at the present time.  Continue the vancomycin 4 times daily at present.  The patient is still symptomatic.  I will add Florastor 250 mg twice daily to try to replace the bacterial flora in the intestine.  EKG today shows normal sinus rhythm/sinus tachycardia with no evidence of atrial fibrillation.  I believe I was appreciating sinus arrhythmia or possible PVCs.  Patient appears dehydrated.  Therefore I have recommended that the patient push fluids to [place] bottles of Gatorade per day in addition to her normal diet.  Reassess on Friday.  11/12/19 Thankfully, the patient's diarrhea has subsided.  She states that she is having 1 bowel movement  today and it was formed.  She denies any abdominal pain or bloating.  She denies any blood in her stool.  Unfortunately on her last lab work, her magnesium was very low at 0.8.  She is now taking 3 magnesium tablets a day and she is due to recheck her magnesium level today.  She denies any palpitations.  She denies any muscle spasms or tetany or cramps.  She is taking omeprazole  Past Medical History:  Diagnosis Date  . Allergy   . Altered gait    USES CANE TO JUDGE DISTANCE DUE TO BLINDNESS IN LEFT EYE  . Colon polyps 2015  . COPD (chronic obstructive pulmonary disease) ([place])   . Diabetes mellitus without complication ([place])   . Emphysema of lung ([place])   . GERD (gastroesophageal reflux disease)   . Hyperlipidemia   . Hypertension   . Legally blind in left eye, as defined in [place] 2009  . Meningioma ([place]) 2009   on left side brain behind left eye/ bliindness left eye  . Multinodular goiter    subclinical hyperthyroidism  . Smoker   . Subclinical hyperthyroidism    due to multinodular goiter.  Confirmed on uptak escan 2015  . Tubular adenoma 2015   Past Surgical History:  Procedure Laterality Date  . ABDOMINAL HYSTERECTOMY  1999   left oopherectomy  . 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 BREAST BIOPSY Left 2007   benign  . [place]   3 times  . INNER EAR SURGERY Right 2001  . LEFT HEART  CATH AND CORONARY ANGIOGRAPHY N/A 04/22/2017   Procedure: Left Heart Cath and Coronary Angiography;  Surgeon: Nelva Bush, MD;  Location: [facility];  Service: Cardiovascular;  Laterality: N/A;   Current Outpatient Medications on File Prior to Visit  Medication Sig Dispense Refill  . ACCU-CHEK AVIVA PLUS test strip TEST BLOOD SUGAR TWO TO THREE TIMES DAILY(SCHEDULE APPOINTMENT FOR FUTURE REFILLS) 300 strip 0  . Accu-Chek Softclix Lancets lancets USE AS INSTRUCTED 300 each 0  . acetaminophen (TYLENOL) 500 MG tablet Take 1,000 mg  by mouth 3 (three) times daily as needed for moderate pain or headache.     . Alcohol Swabs (B-D SINGLE USE SWABS REGULAR) PADS USE AS DIRECTED AS NEEDED FOR FINGERSTICK AND INSULIN INJECTION (NEEDS APPOINTMENT FOR FUTURE REFILLS) 300 each 0  . ALPRAZolam (XANAX) 0.5 MG tablet Take 1 tablet (0.5 mg total) by mouth 3 (three) times daily as needed for anxiety or sleep. 30 tablet 0  . aspirin EC 81 MG tablet Take 1 tablet (81 mg total) by mouth daily. 90 tablet 3  . atorvastatin (LIPITOR) 80 MG tablet TAKE 1 TABLET DAILY (DISCONTINUE ATORVASTATIN 40MG) 90 tablet 2  . Blood Glucose Monitoring Suppl (ACCU-CHEK AVIVA PLUS) w/Device KIT TEST BLOOD SUGAR TWICE DAILY TO THREE TIMES DAILY 1 kit 0  . fluticasone (FLONASE) 50 MCG/ACT nasal spray USE 2 SPRAYS IN EACH NOSTRIL EVERY DAY (Patient taking differently: Place 2 sprays into both nostrils daily. ) 48 g 3  . hydrocortisone 2.5 % cream Apply topically 2 (two) times daily. 30 g 3  . levothyroxine (SYNTHROID, LEVOTHROID) 100 MCG tablet TAKE 1 TABLET EVERY DAY BEFORE BREAKFAST (Patient taking differently: Take 100 mcg by mouth daily before breakfast. TAKE 1 TABLET EVERY DAY BEFORE BREAKFAST) 90 tablet 3  . Magnesium 400 MG TABS Take 400 mg by mouth 2 (two) times daily.     . Multiple Vitamin (MULTIVITAMIN WITH MINERALS) TABS tablet Take 1 tablet by mouth daily.    Marland Kitchen omeprazole (PRILOSEC) 20 MG capsule TAKE 1 CAPSULE EVERY DAY (Patient taking differently: Take 20 mg by mouth daily. ) 90 capsule 3  . potassium chloride SA (K-DUR) 20 MEQ tablet TAKE 2 TABLETS EVERY DAY 180 tablet 1  . saccharomyces boulardii (FLORASTOR) 250 MG capsule Take 1 capsule (250 mg total) by mouth 2 (two) times daily. 60 capsule 1  . triamcinolone cream (KENALOG) 0.5 % Apply 1 application topically 3 (three) times daily. 90 g 1  . ZYRTEC ALLERGY 10 MG tablet Take 10 mg by mouth daily.      No current facility-administered medications on file prior to visit.   Allergies  Allergen  Reactions  . Doxycycline Hives and Shortness Of Breath  . Mobic [Meloxicam]     "feeling of paralysis"  . Glyxambi [Empagliflozin-Linagliptin] Itching  . Sulfa Antibiotics     Per positive allergy test   Social History   Socioeconomic History  . Marital status: Widowed    Spouse name: Not on file  . Number of children: Not on file  . Years of education: Not on file  . Highest education level: Not on file  Occupational History  . Not on file  Tobacco Use  . Smoking status: Former Smoker    Packs/day: 1.50    Years: 30.00    Pack years: 45.00    Types: Cigarettes    Quit date: 2013    Years since quitting: 7.9  . Smokeless tobacco: Never Used  Substance and Sexual Activity  .  Alcohol use: Yes    Alcohol/week: 1.0 standard drinks    Types: 1 Glasses of wine per week    Comment: one nightly cocktail  . Drug use: No  . Sexual activity: Not on file    Comment: married  Other Topics Concern  . Not on file  Social History Narrative  . Not on file   Social Determinants of Health   Financial Resource Strain:   . Difficulty of Paying Living Expenses: Not on file  Food Insecurity:   . Worried About Charity fundraiser in the Last Year: Not on file  . Ran Out of Food in the Last Year: Not on file  Transportation Needs:   . Lack of Transportation (Medical): Not on file  . Lack of Transportation (Non-Medical): Not on file  Physical Activity:   . Days of Exercise per Week: Not on file  . Minutes of Exercise per Session: Not on file  Stress:   . Feeling of Stress : Not on file  Social Connections:   . Frequency of Communication with Friends and Family: Not on file  . Frequency of Social Gatherings with Friends and Family: Not on file  . Attends Religious Services: Not on file  . Active Member of Clubs or Organizations: Not on file  . Attends Archivist Meetings: Not on file  . Marital Status: Not on file  Intimate Partner Violence:   . Fear of Current or  Ex-Partner: Not on file  . Emotionally Abused: Not on file  . Physically Abused: Not on file  . Sexually Abused: Not on file     Review of Systems  All other systems reviewed and are negative.      Objective:   Physical Exam Vitals reviewed.  Cardiovascular:     Rate and Rhythm: Normal rate and regular rhythm.     Heart sounds: Normal heart sounds.  Pulmonary:     Effort: Pulmonary effort is normal. No respiratory distress (    ).     Breath sounds: Normal breath sounds. No wheezing or rales.  Abdominal:     General: Abdomen is flat. Bowel sounds are normal. There is no distension.     Palpations: Abdomen is soft.     Tenderness: There is no abdominal tenderness. There is no guarding.           Assessment & Plan:  C. difficile colitis  Hypomagnesemia - Plan: Magnesium  Clinically, the patient C. difficile colitis has resolved.  She is now asymptomatic with no further diarrhea.  I have recommended that she continue this Florastor for the rest of the month and then discontinue the probiotic as long as she remains asymptomatic.  Monitor closely for any recurrence as this would likely require a prolonged taper off of vancomycin.  Meanwhile recheck a magnesium level today and if the magnesium level remains low, consider discontinuation of the PPI and increasing the magnesium supplement.

## 2019-11-13 ENCOUNTER — Other Ambulatory Visit: Payer: Self-pay | Admitting: Family Medicine

## 2019-11-13 DIAGNOSIS — R790 Abnormal level of blood mineral: Secondary | ICD-10-CM

## 2019-11-13 DIAGNOSIS — R197 Diarrhea, unspecified: Secondary | ICD-10-CM

## 2019-11-13 LAB — MAGNESIUM: Magnesium: 1 mg/dL — CL (ref 1.5–2.5)

## 2019-11-14 ENCOUNTER — Telehealth: Payer: Self-pay

## 2019-11-14 MED ORDER — VANCOMYCIN HCL 125 MG PO CAPS: 125.0000 mg | ORAL_CAPSULE | Freq: Four times a day (QID) | ORAL | 0 refills | Status: AC

## 2019-11-14 NOTE — Telephone Encounter (Signed)
Pt called to report that she has had 6 loose stools between 8pm-8am on Dec.22-23. Please advise.

## 2019-11-14 NOTE — Addendum Note (Signed)
Addended by: Shary Decamp B on: 11/14/2019 02:17 PM   Modules accepted: Orders

## 2019-11-14 NOTE — Telephone Encounter (Signed)
Pt aware and med sent to pharm 

## 2019-11-14 NOTE — Telephone Encounter (Signed)
Check stool for c diff toxin. Meanwhile start vancomycin 125 mg poqid until test returns.

## 2019-11-19 ENCOUNTER — Ambulatory Visit
Admission: RE | Admit: 2019-11-19 | Discharge: 2019-11-19 | Disposition: A | Discharge status: Home / Self Care | Payer: Medicare HMO | Source: Ambulatory Visit | Attending: Family Medicine | Admitting: Family Medicine

## 2019-11-19 ENCOUNTER — Other Ambulatory Visit: Payer: Medicare HMO

## 2019-11-19 ENCOUNTER — Other Ambulatory Visit: Payer: Self-pay

## 2019-11-19 DIAGNOSIS — Z1231 Encounter for screening mammogram for malignant neoplasm of breast: Secondary | ICD-10-CM

## 2019-11-19 DIAGNOSIS — R790 Abnormal level of blood mineral: Secondary | ICD-10-CM

## 2019-11-19 DIAGNOSIS — Z122 Encounter for screening for malignant neoplasm of respiratory organs: Secondary | ICD-10-CM

## 2019-11-19 DIAGNOSIS — Z87891 Personal history of nicotine dependence: Secondary | ICD-10-CM | POA: Diagnosis not present

## 2019-11-20 LAB — MAGNESIUM: Magnesium: 1.6 mg/dL (ref 1.5–2.5)

## 2019-12-04 ENCOUNTER — Ambulatory Visit (INDEPENDENT_AMBULATORY_CARE_PROVIDER_SITE_OTHER): Payer: Medicare HMO | Admitting: Family Medicine

## 2019-12-04 ENCOUNTER — Other Ambulatory Visit: Payer: Self-pay

## 2019-12-04 ENCOUNTER — Encounter: Payer: Self-pay | Admitting: Family Medicine

## 2019-12-04 VITALS — BP 130/74 | HR 82 | Temp 96.6°F | Resp 18 | Ht 66.0 in | Wt 250.0 lb

## 2019-12-04 DIAGNOSIS — R197 Diarrhea, unspecified: Secondary | ICD-10-CM

## 2019-12-04 DIAGNOSIS — M25561 Pain in right knee: Secondary | ICD-10-CM | POA: Diagnosis not present

## 2019-12-04 DIAGNOSIS — G8929 Other chronic pain: Secondary | ICD-10-CM | POA: Diagnosis not present

## 2019-12-04 NOTE — Progress Notes (Signed)
Subjective:    Patient ID: Yolanda Gallegos, female    DOB: 1961/09/30, 59 y.o.   MRN: [id number]  HPI Patient presents today complaining of pain in her right knee.  She has a history of osteoarthritis in the right knee.  Is been quite sometime since she received a cortisone injection in the right knee but she prefers that today.  She has been taking ibuprofen with minimal relief.  She reports pain in the medial joint line specifically but also on the lateral joint line.  Headaches and keeps her awake at night.  It hurts with prolonged standing and walking.  Confidence levels Past Medical History:  Diagnosis Date  . Allergy   . Altered gait    USES CANE TO JUDGE DISTANCE DUE TO BLINDNESS IN LEFT EYE  . Colon polyps 2015  . COPD (chronic obstructive pulmonary disease) ([place])   . Diabetes mellitus without complication ([place])   . Emphysema of lung ([place])   . GERD (gastroesophageal reflux disease)   . Hyperlipidemia   . Hypertension   . Legally blind in left eye, as defined in [place] 2009  . Meningioma ([place]) 2009   on left side brain behind left eye/ bliindness left eye  . Multinodular goiter    subclinical hyperthyroidism  . Smoker   . Subclinical hyperthyroidism    due to multinodular goiter.  Confirmed on uptak escan 2015  . Tubular adenoma 2015   Past Surgical History:  Procedure Laterality Date  . ABDOMINAL HYSTERECTOMY  1999   left oopherectomy  . 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 BREAST BIOPSY Left 2007   benign  . [place]   3 times  . INNER EAR SURGERY Right 2001  . LEFT HEART CATH AND CORONARY ANGIOGRAPHY N/A 04/22/2017   Procedure: Left Heart Cath and Coronary Angiography;  Surgeon: Nelva Bush, MD;  Location: [facility];  Service: Cardiovascular;  Laterality: N/A;   Current Outpatient Medications on File Prior to Visit  Medication Sig Dispense Refill  . ACCU-CHEK AVIVA PLUS test strip TEST BLOOD SUGAR  TWO TO THREE TIMES DAILY(SCHEDULE APPOINTMENT FOR FUTURE REFILLS) 300 strip 0  . Accu-Chek Softclix Lancets lancets USE AS INSTRUCTED 300 each 0  . acetaminophen (TYLENOL) 500 MG tablet Take 1,000 mg by mouth 3 (three) times daily as needed for moderate pain or headache.     . Alcohol Swabs (B-D SINGLE USE SWABS REGULAR) PADS USE AS DIRECTED AS NEEDED FOR FINGERSTICK AND INSULIN INJECTION (NEEDS APPOINTMENT FOR FUTURE REFILLS) 300 each 0  . ALPRAZolam (XANAX) 0.5 MG tablet Take 1 tablet (0.5 mg total) by mouth 3 (three) times daily as needed for anxiety or sleep. 30 tablet 0  . aspirin EC 81 MG tablet Take 1 tablet (81 mg total) by mouth daily. 90 tablet 3  . atorvastatin (LIPITOR) 80 MG tablet TAKE 1 TABLET DAILY (DISCONTINUE ATORVASTATIN 40MG) 90 tablet 2  . Blood Glucose Monitoring Suppl (ACCU-CHEK AVIVA PLUS) w/Device KIT TEST BLOOD SUGAR TWICE DAILY TO THREE TIMES DAILY 1 kit 0  . fluticasone (FLONASE) 50 MCG/ACT nasal spray USE 2 SPRAYS IN EACH NOSTRIL EVERY DAY (Patient taking differently: Place 2 sprays into both nostrils daily. ) 48 g 3  . hydrocortisone 2.5 % cream Apply topically 2 (two) times daily. 30 g 3  . levothyroxine (SYNTHROID, LEVOTHROID) 100 MCG tablet TAKE 1 TABLET EVERY DAY BEFORE BREAKFAST (Patient taking differently: Take 100  mcg by mouth daily before breakfast. TAKE 1 TABLET EVERY DAY BEFORE BREAKFAST) 90 tablet 3  . Magnesium 400 MG TABS Take 400 mg by mouth 2 (two) times daily.     . Multiple Vitamin (MULTIVITAMIN WITH MINERALS) TABS tablet Take 1 tablet by mouth daily.    Marland Kitchen omeprazole (PRILOSEC) 20 MG capsule TAKE 1 CAPSULE EVERY DAY (Patient taking differently: Take 20 mg by mouth daily. ) 90 capsule 3  . potassium chloride SA (K-DUR) 20 MEQ tablet TAKE 2 TABLETS EVERY DAY 180 tablet 1  . saccharomyces boulardii (FLORASTOR) 250 MG capsule Take 1 capsule (250 mg total) by mouth 2 (two) times daily. 60 capsule 1  . triamcinolone cream (KENALOG) 0.5 % Apply 1 application  topically 3 (three) times daily. 90 g 1  . ZYRTEC ALLERGY 10 MG tablet Take 10 mg by mouth daily.      No current facility-administered medications on file prior to visit.   Allergies  Allergen Reactions  . Doxycycline Hives and Shortness Of Breath  . Mobic [Meloxicam]     "feeling of paralysis"  . Glyxambi [Empagliflozin-Linagliptin] Itching  . Sulfa Antibiotics     Per positive allergy test   Social History   Socioeconomic History  . Marital status: Widowed    Spouse name: Not on file  . Number of children: Not on file  . Years of education: Not on file  . Highest education level: Not on file  Occupational History  . Not on file  Tobacco Use  . Smoking status: Former Smoker    Packs/day: 1.50    Years: 30.00    Pack years: 45.00    Types: Cigarettes    Quit date: 2013    Years since quitting: 8.0  . Smokeless tobacco: Never Used  Substance and Sexual Activity  . Alcohol use: Yes    Alcohol/week: 1.0 standard drinks    Types: 1 Glasses of wine per week    Comment: one nightly cocktail  . Drug use: No  . Sexual activity: Not on file    Comment: married  Other Topics Concern  . Not on file  Social History Narrative  . Not on file   Social Determinants of Health   Financial Resource Strain:   . Difficulty of Paying Living Expenses: Not on file  Food Insecurity:   . Worried About Charity fundraiser in the Last Year: Not on file  . Ran Out of Food in the Last Year: Not on file  Transportation Needs:   . Lack of Transportation (Medical): Not on file  . Lack of Transportation (Non-Medical): Not on file  Physical Activity:   . Days of Exercise per Week: Not on file  . Minutes of Exercise per Session: Not on file  Stress:   . Feeling of Stress : Not on file  Social Connections:   . Frequency of Communication with Friends and Family: Not on file  . Frequency of Social Gatherings with Friends and Family: Not on file  . Attends Religious Services: Not on file  .  Active Member of Clubs or Organizations: Not on file  . Attends Archivist Meetings: Not on file  . Marital Status: Not on file  Intimate Partner Violence:   . Fear of Current or Ex-Partner: Not on file  . Emotionally Abused: Not on file  . Physically Abused: Not on file  . Sexually Abused: Not on file     Review of Systems  All other systems  reviewed and are negative.      Objective:   Physical Exam Vitals reviewed.  Cardiovascular:     Rate and Rhythm: Normal rate and regular rhythm.     Heart sounds: Normal heart sounds.  Pulmonary:     Effort: Pulmonary effort is normal. No respiratory distress (    ).     Breath sounds: Normal breath sounds. No wheezing or rales.  Musculoskeletal:     Right knee: Swelling and effusion present. Decreased range of motion. Tenderness present over the medial joint line and lateral joint line.           Assessment & Plan:  Chronic pain of right knee  Using sterile technique, I injected the right knee with 2 cc lidocaine, 2 cc of Marcaine, and 2 cc of 40 mg/mL Kenalog.  Patient tolerated the procedure well without complication.  I suspect the knee pain is due to osteoarthritis.

## 2019-12-05 LAB — CLOSTRIDIUM DIFFICILE TOXIN B, QUALITATIVE, REAL-TIME PCR: Toxigenic C. Difficile by PCR: DETECTED — AB

## 2019-12-06 ENCOUNTER — Other Ambulatory Visit: Payer: Self-pay | Admitting: Family Medicine

## 2019-12-06 MED ORDER — VANCOMYCIN HCL 125 MG PO CAPS: ORAL_CAPSULE | ORAL | 0 refills | Status: DC

## 2019-12-20 ENCOUNTER — Other Ambulatory Visit: Payer: Self-pay | Admitting: Family Medicine

## 2019-12-20 MED ORDER — ALPRAZOLAM 0.5 MG PO TABS: 0.5000 mg | ORAL_TABLET | Freq: Three times a day (TID) | ORAL | 0 refills | Status: DC | PRN

## 2019-12-20 NOTE — Telephone Encounter (Signed)
Patient calling for refill on xanax [employer]  [phone]

## 2019-12-20 NOTE — Telephone Encounter (Signed)
Pt is requesting refill on Xanax   LOV:  12/04/19  LRF:   09/17/2019

## 2019-12-21 ENCOUNTER — Other Ambulatory Visit: Payer: Self-pay | Admitting: Family Medicine

## 2019-12-21 ENCOUNTER — Telehealth: Payer: Self-pay | Admitting: Internal Medicine

## 2019-12-21 NOTE — Telephone Encounter (Signed)
Given previously elevated blood pressures and improvement with reinitiation of metoprolol, I am fine with her continuing metoprolol succinate 50 mg twice daily.  Okay to refill prescription.  Nelva Bush, MD [facility]

## 2019-12-21 NOTE — Telephone Encounter (Signed)
I spoke with pt she is taking Metoprolol Succinate 50 mg BID. Pt mentioned that she has been having elevated BP she started taking her metoprolol succinate again. Medication was D/c last hospital visit 10/26/2019.

## 2019-12-21 NOTE — Telephone Encounter (Signed)
Upon discharge from the hospital in December, provider had recommending: " Advised she continue to hold antihypertensives, monitor blood pressure at home, and can resume BP medications if it's gets up over 140/90."  Patient states her BP prior to resuming was running 139-140's/90-95. She resumed metoprolol succinate 50 mg two times a day and now BP is running 130's/80's with heart rates of 80-90.  Advised I will make sure with Dr End ok to send in the prescription.

## 2019-12-21 NOTE — Telephone Encounter (Signed)
Refill sent.

## 2020-01-04 ENCOUNTER — Other Ambulatory Visit: Payer: Self-pay | Admitting: Family Medicine

## 2020-01-04 NOTE — Telephone Encounter (Signed)
Requesting refill    Xanax  (would like mail order as it is a lot cheaper)   LOV: 12/04/19  LRF:  12/20/2019

## 2020-01-07 MED ORDER — ALPRAZOLAM 0.5 MG PO TABS: ORAL_TABLET | ORAL | 1 refills | Status: DC

## 2020-01-08 ENCOUNTER — Other Ambulatory Visit: Payer: Self-pay | Admitting: Family Medicine

## 2020-02-20 ENCOUNTER — Other Ambulatory Visit: Payer: Self-pay | Admitting: Family Medicine

## 2020-02-27 ENCOUNTER — Telehealth: Payer: Self-pay | Admitting: Family Medicine

## 2020-02-27 NOTE — Telephone Encounter (Signed)
Pt called and states that she finished the antibx about a week ago and her diarrhea is back and wanted to know what she should do now?

## 2020-02-28 ENCOUNTER — Other Ambulatory Visit: Payer: Self-pay | Admitting: Family Medicine

## 2020-02-28 DIAGNOSIS — R197 Diarrhea, unspecified: Secondary | ICD-10-CM

## 2020-02-28 NOTE — Telephone Encounter (Signed)
We need to check stool for c diff to see if it has returned.

## 2020-02-28 NOTE — Telephone Encounter (Signed)
Pt aware via vm and order entered.

## 2020-02-29 DIAGNOSIS — A0471 Enterocolitis due to Clostridium difficile, recurrent: Secondary | ICD-10-CM | POA: Diagnosis not present

## 2020-02-29 DIAGNOSIS — I499 Cardiac arrhythmia, unspecified: Secondary | ICD-10-CM | POA: Diagnosis not present

## 2020-02-29 DIAGNOSIS — E870 Hyperosmolality and hypernatremia: Secondary | ICD-10-CM | POA: Diagnosis not present

## 2020-02-29 DIAGNOSIS — Z20822 Contact with and (suspected) exposure to covid-19: Secondary | ICD-10-CM | POA: Diagnosis not present

## 2020-02-29 DIAGNOSIS — A09 Infectious gastroenteritis and colitis, unspecified: Secondary | ICD-10-CM | POA: Diagnosis not present

## 2020-02-29 DIAGNOSIS — M1990 Unspecified osteoarthritis, unspecified site: Secondary | ICD-10-CM | POA: Diagnosis not present

## 2020-02-29 DIAGNOSIS — I10 Essential (primary) hypertension: Secondary | ICD-10-CM | POA: Diagnosis not present

## 2020-02-29 DIAGNOSIS — E119 Type 2 diabetes mellitus without complications: Secondary | ICD-10-CM | POA: Diagnosis not present

## 2020-02-29 DIAGNOSIS — A0472 Enterocolitis due to Clostridium difficile, not specified as recurrent: Secondary | ICD-10-CM | POA: Diagnosis not present

## 2020-02-29 DIAGNOSIS — R200 Anesthesia of skin: Secondary | ICD-10-CM | POA: Diagnosis not present

## 2020-02-29 DIAGNOSIS — E039 Hypothyroidism, unspecified: Secondary | ICD-10-CM | POA: Diagnosis not present

## 2020-02-29 DIAGNOSIS — R202 Paresthesia of skin: Secondary | ICD-10-CM | POA: Diagnosis not present

## 2020-02-29 DIAGNOSIS — E876 Hypokalemia: Secondary | ICD-10-CM | POA: Diagnosis not present

## 2020-03-01 DIAGNOSIS — R200 Anesthesia of skin: Secondary | ICD-10-CM | POA: Diagnosis not present

## 2020-03-01 DIAGNOSIS — R202 Paresthesia of skin: Secondary | ICD-10-CM | POA: Diagnosis not present

## 2020-03-02 MED ORDER — AMLODIPINE BESYLATE 5 MG PO TABS
5.00 | ORAL_TABLET | ORAL | Status: DC
Start: 2020-03-03 — End: 2020-03-02

## 2020-03-02 MED ORDER — ALUM & MAG HYDROXIDE-SIMETH 400-400-40 MG/5ML PO SUSP
30.00 | ORAL | Status: DC
Start: ? — End: 2020-03-02

## 2020-03-02 MED ORDER — PROMETHAZINE HCL 12.5 MG PO TABS
12.50 | ORAL_TABLET | ORAL | Status: DC
Start: ? — End: 2020-03-02

## 2020-03-02 MED ORDER — FLUTICASONE PROPIONATE 50 MCG/ACT NA SUSP
1.00 | NASAL | Status: DC
Start: 2020-03-03 — End: 2020-03-02

## 2020-03-02 MED ORDER — LOSARTAN POTASSIUM 100 MG PO TABS
100.00 | ORAL_TABLET | ORAL | Status: DC
Start: 2020-03-03 — End: 2020-03-02

## 2020-03-02 MED ORDER — POTASSIUM CHLORIDE CRYS ER 20 MEQ PO TBCR
20.00 | EXTENDED_RELEASE_TABLET | ORAL | Status: DC
Start: 2020-03-03 — End: 2020-03-02

## 2020-03-02 MED ORDER — ATORVASTATIN CALCIUM 80 MG PO TABS
80.00 | ORAL_TABLET | ORAL | Status: DC
Start: 2020-03-03 — End: 2020-03-02

## 2020-03-02 MED ORDER — METOPROLOL SUCCINATE ER 50 MG PO TB24
50.00 | ORAL_TABLET | ORAL | Status: DC
Start: 2020-03-03 — End: 2020-03-02

## 2020-03-02 MED ORDER — FIDAXOMICIN 200 MG PO TABS
200.00 | ORAL_TABLET | ORAL | Status: DC
Start: 2020-03-02 — End: 2020-03-02

## 2020-03-02 MED ORDER — ALPRAZOLAM 0.5 MG PO TABS
0.50 | ORAL_TABLET | ORAL | Status: DC
Start: ? — End: 2020-03-02

## 2020-03-02 MED ORDER — MAGNESIUM OXIDE 400 MG PO TABS
400.00 | ORAL_TABLET | ORAL | Status: DC
Start: 2020-03-02 — End: 2020-03-02

## 2020-03-02 MED ORDER — CETIRIZINE HCL 10 MG PO TABS
5.00 | ORAL_TABLET | ORAL | Status: DC
Start: 2020-03-03 — End: 2020-03-02

## 2020-03-02 MED ORDER — MELATONIN 3 MG PO TABS
3.00 | ORAL_TABLET | ORAL | Status: DC
Start: ? — End: 2020-03-02

## 2020-03-02 MED ORDER — LACTATED RINGERS IV SOLN
100.00 | INTRAVENOUS | Status: DC
Start: ? — End: 2020-03-02

## 2020-03-02 MED ORDER — LEVOTHYROXINE SODIUM 50 MCG PO TABS
100.00 | ORAL_TABLET | ORAL | Status: DC
Start: 2020-03-03 — End: 2020-03-02

## 2020-03-02 MED ORDER — ACETAMINOPHEN 325 MG PO TABS
650.00 | ORAL_TABLET | ORAL | Status: DC
Start: ? — End: 2020-03-02

## 2020-03-02 MED ORDER — CALCIUM CARBONATE 1250 (500 CA) MG PO CHEW
CHEWABLE_TABLET | ORAL | Status: DC
Start: ? — End: 2020-03-02

## 2020-03-07 ENCOUNTER — Telehealth: Payer: Self-pay | Admitting: Family Medicine

## 2020-03-07 NOTE — Telephone Encounter (Signed)
Phone call placed to patient to follow up on recent hospital discharge from [facility] on 03/02/2020. Patient states that she is doing good at the moment she is still experiencing diarrhea and is currently trying to keep her fluids up to prevent from being dehydrated. She was prescribed fidoxamicin by [facility] and she is currently awaiting a decision to be rendered in regards to patient assistance as the medication was going to be $600 out of pocket. She informed me that [facility] has submitted all patient assistance forms to [place]. Patient also informed me that she has recently moved to [place] and will need her hospital follow up to be a video visit. Video visit scheduled for patient on 03/13/2020 at 12:30 PM

## 2020-03-13 ENCOUNTER — Other Ambulatory Visit: Payer: Self-pay

## 2020-03-13 ENCOUNTER — Telehealth (INDEPENDENT_AMBULATORY_CARE_PROVIDER_SITE_OTHER): Payer: Medicare HMO | Admitting: Family Medicine

## 2020-03-13 DIAGNOSIS — A0472 Enterocolitis due to Clostridium difficile, not specified as recurrent: Secondary | ICD-10-CM

## 2020-03-13 DIAGNOSIS — Z86010 Personal history of colonic polyps: Secondary | ICD-10-CM | POA: Diagnosis not present

## 2020-03-13 DIAGNOSIS — E860 Dehydration: Secondary | ICD-10-CM | POA: Diagnosis not present

## 2020-03-13 DIAGNOSIS — E878 Other disorders of electrolyte and fluid balance, not elsewhere classified: Secondary | ICD-10-CM | POA: Diagnosis not present

## 2020-03-13 DIAGNOSIS — Z8619 Personal history of other infectious and parasitic diseases: Secondary | ICD-10-CM | POA: Diagnosis not present

## 2020-03-13 DIAGNOSIS — E119 Type 2 diabetes mellitus without complications: Secondary | ICD-10-CM | POA: Diagnosis not present

## 2020-03-13 MED ORDER — TRAMADOL HCL 50 MG PO TABS: 50.0000 mg | ORAL_TABLET | Freq: Three times a day (TID) | ORAL | 0 refills | Status: AC | PRN

## 2020-03-13 MED ORDER — VANCOMYCIN HCL 125 MG PO CAPS: 125.0000 mg | ORAL_CAPSULE | Freq: Four times a day (QID) | ORAL | 0 refills | Status: DC

## 2020-03-13 NOTE — Progress Notes (Signed)
Subjective:    Patient ID: Yolanda Gallegos, female    DOB: 1961/09/24, 59 y.o.   MRN: [id number]  HPI  Recently admitted to the hospital with severe diarrhea.  I have copied the DC summary below for reference:  Admit date: 10/26/2019 Discharge date: 10/28/2019  Admitted From: Home Disposition:  Home  Recommendations for Outpatient Follow-up:  1. Follow up with PCP in 1-2 weeks 2. 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 Hypotensive on admission but MAPs maintained and responsive to IV fluid. Labs showed hypokalemia (K 2.7), renal failure (Cr 2.52, from 0.72 on 11/30), leukocytosis (wbc 17.8k). CT abdomen pelvis from 11/30 showed signs of colitis. Stool studies were ordered including GI panel and C. Difficile.C diff positive, started PO vancomycin and IV Flagyl for fulminant C diff given hypotension.Patient's blood pressure improved with aggressive IV fluid repletion.  Normotensive today with BP medications still on hold.  Stools became much less frequent and no longer watery, partially formed but loose.  IV Flagyl discontinued. Patient clinically improved and hemodynamically stable for discharge home today, to complete course of oral Vancomycin.  Advised she continue to hold antihypertensives, monitor blood pressure at home, and can resume BP medications if it's gets up over 140/90.  Counseled patient about precautions at home, importance of frequent hand washing with soap and water.  Patient agreeable with  the plan and close PCP follow up.   Discharge Diagnoses: Principal Problem:   Diarrhea Active Problems:   AKI (acute kidney injury) ([place])   Hypokalemia   Hypotension   Essential hypertension   Leukocytosis   Hyperlipidemia   COPD (chronic obstructive pulmonary disease) (H[facility])   Gastroesophageal reflux disease   Seasonal allergies   Hypothyroidism   Clostridioides difficile infection  Severe Sepsis secondary to C diff Fulminant C. Difficile Infection  As evidenced by leukocytosis, hypotension, acute renal failure, C diff antigen (+), PCR (+), toxin (-). Initial C diff episode. Diarrhea- due to above Nausea- due to above Clinical presentation, leukocytosis, acute renal failure in the setting of recent antibiotic use prior to onset is most concerning for C. difficile infection. FOBT positive, however doubt this is a true GI bleed, more likely colitis. --continue oral vancomycin for total 10 day course --stop IV Flagyl since hypotension resolved and diarrhea improving -GI pathogen panel pending -stop IV fluids and monitor BP -Enteric precautions  -Diet as tolerated -Zofran as needed --Lactic acid pending  Acute kidney injury Likely prerenal in the setting of hypotension and diarrhea. ATN the possibility. -IV hydration as above -Monitor BMP -Renally dose meds as indicated -Avoid nephrotoxic agents  Hypokalemia-due to diarrhea, repleted --monitor electrolytes --replete for goal K>4.0, Mg>2.0  Hypotension-due to diarrhea. Maintaining MAPs for most part with IV fluid support as above.  --Monitor BP closely --Maintain MAP > 65   Leukocytosis-improving. Due toC. difficile infection -Monitor CBC -Monitor for fevers  Essential hypertension Hypotensive on admission as above  -Hold Cozaar, amlodipine, metoprolol  ? Type 2 diabetes Does not appear to be on medications outpatient. Glucose on BMP 157. -Monitor blood glucose -A1cpending -We  will hold off oninsulinCBG consistently over 140  Hyperlipidemia-continue home Lipitor  COPD-not acutely exacerbated -Does not appear to be on inhalers at home  GERD-, stable -Continue home PPI  Seasonal allergies -Continue home Flonase and stop loratadine for Zyrtec  Hypothyroidism -Continue levothyroxine   11/05/19 Developed C diff after antibiotics for recent sinus infection.  Patient states that she is still having diarrhea.  She is having watery stools 4-5 times a day.  She denies any blood in her stool.  She denies any abdominal pain however she still feels extremely weak and dizzy.  She is taking her metoprolol despite it not being on her medicine list however her heart rate today is extremely high.  On my exam she was 120 bpm and somewhat irregular.  She denies any chest pain or shortness of breath or dyspnea on exertion although she does report fatigue and lightheadedness.  At that time, my plan was: Patient is tachycardic on exam.  I will obtain an EKG to evaluate further.  I will check a CMP to monitor the patient's electrolytes particularly potassium as well as her magnesium as she was deficient in these due to diarrhea.  Patient is still relatively hypotensive and therefore I will not have the patient resume her antihypertensives at the present time.  Continue the vancomycin 4 times daily at present.  The patient is still symptomatic.  I will add Florastor 250 mg twice daily to try to replace the bacterial flora in the intestine.  EKG today shows normal sinus rhythm/sinus tachycardia with no evidence of atrial fibrillation.  I believe I was appreciating sinus arrhythmia or possible PVCs.  Patient appears dehydrated.  Therefore I have recommended that the patient push fluids to [place] bottles of Gatorade per day in addition to her normal diet.  Reassess on Friday.  11/12/19 Thankfully, the patient's diarrhea has subsided.  She states that she is having 1 bowel movement  today and it was formed.  She denies any abdominal pain or bloating.  She denies any blood in her stool.  Unfortunately on her last lab work, her magnesium was very low at 0.8.  She is now taking 3 magnesium tablets a day and she is due to recheck her magnesium level today.  She denies any palpitations.  She denies any muscle spasms or tetany or cramps.  She is taking omeprazole  At that time, my plan was: Clinically, the patient C. difficile colitis has resolved.  She is now asymptomatic with no further diarrhea.  I have recommended that she continue this Florastor for the rest of the month and then discontinue the probiotic as long as she remains asymptomatic.  Monitor closely for any recurrence as this would likely require a prolonged taper off of vancomycin.  Meanwhile recheck a magnesium level today and if the magnesium level remains low, consider discontinuation of the PPI and increasing the magnesium supplement.  03/13/20 Unfortunately, the patient ultimately required a prolonged 1 month taper off vancomycin since that visit.  Patient is being seen today as a telephone visit.  She consents to be seen as a telephone call.  Phone call began at 1230.  Phone call concluded at 1248.  Patient has recently been admitted to the hospital in the [facility] system for recurrent C. difficile colitis.  Having failed outpatient treatment with vancomycin now on several occasions she was started on Fidaxomicin.  However she was discharged home and is unable to afford the medication.  She is contacted patient's assistance through the manufacture and is scheduled to receive the antibiotic  either tomorrow or early next week however she is having recurrent diarrhea and is not currently on any medication.  She spoke with the gastroenterologist at [facility] today and they mention doing the fecal transplant if the antibiotic did not work.  However my concern is that she may not make it till next week if the diarrhea continues as she may be  hospitalized again due to dehydration.  In the past when she has taken vancomycin, the diarrhea would subside as long as she was on the medication.  She also reports pain in her left knee and her lower back.  She is taking Aleve and Tylenol on a daily basis however the pain in her left knee is not improving.  In the past I have given her cortisone injections however she is unable to come in to the office today as she is in [facility].  She did would like something to help manage the pain in her knee until she can be seen.  Past Medical History:  Diagnosis Date  . Allergy   . Altered gait    USES CANE TO JUDGE DISTANCE DUE TO BLINDNESS IN LEFT EYE  . Colon polyps 2015  . COPD (chronic obstructive pulmonary disease) ([place])   . Diabetes mellitus without complication ([place])   . Emphysema of lung ([place])   . GERD (gastroesophageal reflux disease)   . Hyperlipidemia   . Hypertension   . Legally blind in left eye, as defined in [place] 2009  . Meningioma ([place]) 2009   on left side brain behind left eye/ bliindness left eye  . Multinodular goiter    subclinical hyperthyroidism  . Smoker   . Subclinical hyperthyroidism    due to multinodular goiter.  Confirmed on uptak escan 2015  . Tubular adenoma 2015   Past Surgical History:  Procedure Laterality Date  . ABDOMINAL HYSTERECTOMY  1999   left oopherectomy  . 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 BREAST BIOPSY Left 2007   benign  . [place]   3 times  . INNER EAR SURGERY Right 2001  . LEFT HEART CATH AND CORONARY ANGIOGRAPHY N/A 04/22/2017   Procedure: Left Heart Cath and Coronary Angiography;  Surgeon: Nelva Bush, MD;  Location: [facility];  Service: Cardiovascular;  Laterality: N/A;   Current Outpatient Medications on File Prior to Visit  Medication Sig Dispense Refill  . Accu-Chek Softclix Lancets lancets USE AS INSTRUCTED 300 each 2  . acetaminophen (TYLENOL) 500 MG  tablet Take 1,000 mg by mouth 3 (three) times daily as needed for moderate pain or headache.     . Alcohol Swabs (B-D SINGLE USE SWABS REGULAR) PADS USE AS DIRECTED AS NEEDED FOR FINGERSTICK AND INSULIN INJECTION 300 each 2  . ALPRAZolam (XANAX) 0.5 MG tablet Pt may take up to 1 tab po tid prn sleep and/or anxiety 90 tablet 1  . aspirin EC 81 MG tablet Take 1 tablet (81 mg total) by mouth daily. 90 tablet 3  . atorvastatin (LIPITOR) 80 MG tablet TAKE 1 TABLET DAILY (DISCONTINUE ATORVASTATIN '40MG'$ ) 90 tablet 2  . Blood Glucose Monitoring Suppl (ACCU-CHEK AVIVA PLUS) w/Device KIT TEST BLOOD SUGAR TWICE DAILY TO THREE TIMES DAILY 1 kit 0  . fluticasone (FLONASE) 50 MCG/ACT nasal spray USE 2 SPRAYS IN EACH NOSTRIL EVERY DAY (Patient taking differently: Place 2 sprays into both nostrils daily. ) 48 g 3  . glucose blood (ACCU-CHEK AVIVA  PLUS) test strip USE AS DIRECTED AS NEEDED FOR FINGERSTICK AND INSULIN INJECTION 300 strip 0  . hydrocortisone 2.5 % cream Apply topically 2 (two) times daily. 30 g 3  . levothyroxine (SYNTHROID) 100 MCG tablet TAKE 1 TABLET EVERY DAY BEFORE BREAKFAST  90 tablet 3  . losartan (COZAAR) 100 MG tablet TAKE 1 TABLET EVERY DAY 90 tablet 3  . Magnesium 400 MG TABS Take 400 mg by mouth 2 (two) times daily.     . metoprolol succinate (TOPROL-XL) 50 MG 24 hr tablet TAKE 1 TABLET TWICE DAILY 180 tablet 1  . Multiple Vitamin (MULTIVITAMIN WITH MINERALS) TABS tablet Take 1 tablet by mouth daily.    Marland Kitchen omeprazole (PRILOSEC) 20 MG capsule TAKE 1 CAPSULE EVERY DAY 90 capsule 3  . potassium chloride SA (KLOR-CON) 20 MEQ tablet TAKE 2 TABLETS EVERY DAY 180 tablet 3  . saccharomyces boulardii (FLORASTOR) 250 MG capsule Take 1 capsule (250 mg total) by mouth 2 (two) times daily. 60 capsule 1  . triamcinolone cream (KENALOG) 0.5 % Apply 1 application topically 3 (three) times daily. 90 g 1  . ZYRTEC ALLERGY 10 MG tablet Take 10 mg by mouth daily.      No current facility-administered  medications on file prior to visit.   Allergies  Allergen Reactions  . Doxycycline Hives and Shortness Of Breath  . Mobic [Meloxicam]     "feeling of paralysis"  . Glyxambi [Empagliflozin-Linagliptin] Itching  . Sulfa Antibiotics     Per positive allergy test   Social History   Socioeconomic History  . Marital status: Widowed    Spouse name: Not on file  . Number of children: Not on file  . Years of education: Not on file  . Highest education level: Not on file  Occupational History  . Not on file  Tobacco Use  . Smoking status: Former Smoker    Packs/day: 1.50    Years: 30.00    Pack years: 45.00    Types: Cigarettes    Quit date: 2013    Years since quitting: 8.3  . Smokeless tobacco: Never Used  Substance and Sexual Activity  . Alcohol use: Yes    Alcohol/week: 1.0 standard drinks    Types: 1 Glasses of wine per week    Comment: one nightly cocktail  . Drug use: No  . Sexual activity: Not on file    Comment: married  Other Topics Concern  . Not on file  Social History Narrative  . Not on file   Social Determinants of Health   Financial Resource Strain:   . Difficulty of Paying Living Expenses:   Food Insecurity:   . Worried About Charity fundraiser in the Last Year:   . Arboriculturist in the Last Year:   Transportation Needs:   . Film/video editor (Medical):   Marland Kitchen Lack of Transportation (Non-Medical):   Physical Activity:   . Days of Exercise per Week:   . Minutes of Exercise per Session:   Stress:   . Feeling of Stress :   Social Connections:   . Frequency of Communication with Friends and Family:   . Frequency of Social Gatherings with Friends and Family:   . Attends Religious Services:   . Active Member of Clubs or Organizations:   . Attends Archivist Meetings:   Marland Kitchen Marital Status:   Intimate Partner Violence:   . Fear of Current or Ex-Partner:   . Emotionally Abused:   .  Physically Abused:   . Sexually Abused:      Review  of Systems  All other systems reviewed and are negative.      Objective:          Assessment & Plan:  C. difficile colitis  Patient has had multiple episodes of recurrent C. difficile colitis and is now on outpatient Fidaxomicin.  However she is awaiting for the financial assistance through the manufacturer to provide IV antibiotics for free.  Currently she has no treatment and the diarrhea has resumed.  Therefore I will call out vancomycin 125 mg 4 times daily to be taken until she receives the new antibiotic in effort to avoid dehydration and repeat hospitalization.  I will also call out tramadol 50 mg every 8 hours as needed for knee pain until she can be seen in my office for cortisone injection.  We also discussed possibly transferring her care to an orthopedic to discuss viscosupplementation injections.  She defers that at the present time she is battling C. difficile colitis.

## 2020-03-17 ENCOUNTER — Telehealth: Payer: Self-pay | Admitting: *Deleted

## 2020-03-17 MED ORDER — VANCOMYCIN HCL 125 MG PO CAPS: 125.0000 mg | ORAL_CAPSULE | Freq: Four times a day (QID) | ORAL | 0 refills | Status: AC

## 2020-03-17 NOTE — Telephone Encounter (Signed)
Received request from pharmacy for PA on Vancomycin.   PA submitted.   Dx: A04.72- C Diff Colitis.   Received immediate determination.   Micro WE:[id number] Approved 11/23/2019- 11/21/2020.

## 2020-03-20 ENCOUNTER — Other Ambulatory Visit: Payer: Self-pay | Admitting: *Deleted

## 2020-03-20 NOTE — Telephone Encounter (Signed)
Received fax requesting refill on Xanax to mail order.   Ok to refill??  Last office visit 03/13/2020.  Last refill 01/07/2020, #1 refill to mail order.

## 2020-03-21 MED ORDER — ALPRAZOLAM 0.5 MG PO TABS: ORAL_TABLET | ORAL | 1 refills | Status: AC

## 2020-03-27 ENCOUNTER — Other Ambulatory Visit: Payer: Self-pay | Admitting: *Deleted

## 2020-03-31 ENCOUNTER — Other Ambulatory Visit: Payer: Self-pay | Admitting: *Deleted

## 2020-03-31 MED ORDER — LEVOTHYROXINE SODIUM 100 MCG PO TABS: 100.0000 ug | ORAL_TABLET | Freq: Every day | ORAL | 3 refills | Status: AC

## 2020-03-31 MED ORDER — METOPROLOL SUCCINATE ER 50 MG PO TB24: 50.0000 mg | ORAL_TABLET | Freq: Two times a day (BID) | ORAL | 3 refills | Status: AC

## 2020-03-31 MED ORDER — FAMOTIDINE 20 MG PO TABS: 20.0000 mg | ORAL_TABLET | Freq: Every day | ORAL | 3 refills | Status: AC

## 2020-03-31 MED ORDER — ACCU-CHEK SOFTCLIX LANCETS MISC: 2 refills | Status: DC

## 2020-03-31 MED ORDER — ATORVASTATIN CALCIUM 80 MG PO TABS: ORAL_TABLET | ORAL | 2 refills | Status: AC

## 2020-03-31 MED ORDER — AMLODIPINE BESYLATE 5 MG PO TABS: 5.0000 mg | ORAL_TABLET | Freq: Every day | ORAL | 3 refills | Status: AC

## 2020-03-31 MED ORDER — LOSARTAN POTASSIUM 100 MG PO TABS: 100.0000 mg | ORAL_TABLET | Freq: Every day | ORAL | 3 refills | Status: AC

## 2020-04-02 ENCOUNTER — Other Ambulatory Visit: Payer: Self-pay | Admitting: Family Medicine

## 2020-04-02 MED ORDER — ACCU-CHEK GUIDE VI STRP: ORAL_STRIP | 12 refills | Status: DC

## 2020-04-02 MED ORDER — BD SWAB SINGLE USE REGULAR PADS: MEDICATED_PAD | 2 refills | Status: AC

## 2020-04-04 ENCOUNTER — Other Ambulatory Visit: Payer: Self-pay | Admitting: Family Medicine

## 2020-04-04 MED ORDER — FAMOTIDINE 20 MG PO TABS
20.00 | ORAL_TABLET | ORAL | Status: DC
Start: 2020-04-04 — End: 2020-04-04

## 2020-04-04 MED ORDER — TRAMADOL HCL 50 MG PO TABS
50.00 | ORAL_TABLET | ORAL | Status: DC
Start: ? — End: 2020-04-04

## 2020-04-04 MED ORDER — ASCORBIC ACID 500 MG PO TABS
500.00 | ORAL_TABLET | ORAL | Status: DC
Start: 2020-04-04 — End: 2020-04-04

## 2020-04-04 MED ORDER — GENERIC EXTERNAL MEDICATION
Status: DC
Start: ? — End: 2020-04-04

## 2020-04-04 MED ORDER — ATORVASTATIN CALCIUM 40 MG PO TABS
80.00 | ORAL_TABLET | ORAL | Status: DC
Start: 2020-04-04 — End: 2020-04-04

## 2020-04-04 MED ORDER — OXYCODONE-ACETAMINOPHEN 5-325 MG PO TABS
1.00 | ORAL_TABLET | ORAL | Status: DC
Start: ? — End: 2020-04-04

## 2020-04-04 MED ORDER — AMLODIPINE BESYLATE 5 MG PO TABS
5.00 | ORAL_TABLET | ORAL | Status: DC
Start: 2020-04-04 — End: 2020-04-04

## 2020-04-04 MED ORDER — METOPROLOL SUCCINATE ER 50 MG PO TB24
50.00 | ORAL_TABLET | ORAL | Status: DC
Start: 2020-04-04 — End: 2020-04-04

## 2020-04-04 MED ORDER — DIPHENHYDRAMINE HCL (SLEEP) 25 MG PO TABS
25.00 | ORAL_TABLET | ORAL | Status: DC
Start: ? — End: 2020-04-04

## 2020-04-04 MED ORDER — GENERIC EXTERNAL MEDICATION
100.00 | Status: DC
Start: 2020-04-04 — End: 2020-04-04

## 2020-04-04 MED ORDER — LEVOTHYROXINE SODIUM 100 MCG PO TABS
100.00 | ORAL_TABLET | ORAL | Status: DC
Start: 2020-04-04 — End: 2020-04-04

## 2020-04-04 MED ORDER — ENOXAPARIN SODIUM 40 MG/0.4ML ~~LOC~~ SOLN
40.00 | SUBCUTANEOUS | Status: DC
Start: 2020-04-04 — End: 2020-04-04

## 2020-04-04 MED ORDER — ALPRAZOLAM 0.5 MG PO TABS
0.50 | ORAL_TABLET | ORAL | Status: DC
Start: ? — End: 2020-04-04

## 2020-04-04 MED ORDER — CHOLECALCIFEROL 25 MCG (1000 UT) PO TABS
1000.00 | ORAL_TABLET | ORAL | Status: DC
Start: 2020-04-04 — End: 2020-04-04

## 2020-04-04 MED ORDER — FLUTICASONE FUROATE-VILANTEROL 200-25 MCG/INH IN AEPB
1.00 | INHALATION_SPRAY | RESPIRATORY_TRACT | Status: DC
Start: 2020-04-04 — End: 2020-04-04

## 2020-04-04 MED ORDER — RISAQUAD PO CAPS
1.00 | ORAL_CAPSULE | ORAL | Status: DC
Start: 2020-04-04 — End: 2020-04-04

## 2020-04-04 MED ORDER — LACTATED RINGERS IV SOLN
125.00 | INTRAVENOUS | Status: DC
Start: ? — End: 2020-04-04

## 2020-04-04 MED ORDER — LOPERAMIDE HCL 2 MG PO CAPS
2.00 | ORAL_CAPSULE | ORAL | Status: DC
Start: ? — End: 2020-04-04

## 2020-04-04 MED ORDER — VANCOMYCIN HCL 125 MG PO CAPS
125.00 | ORAL_CAPSULE | ORAL | Status: DC
Start: 2020-04-03 — End: 2020-04-04

## 2020-04-04 MED ORDER — GENERIC EXTERNAL MEDICATION
6.00 | Status: DC
Start: 2020-04-04 — End: 2020-04-04

## 2020-04-04 MED ORDER — NALOXONE HCL 0.4 MG/ML IJ SOLN
0.10 | INTRAMUSCULAR | Status: DC
Start: ? — End: 2020-04-04

## 2020-04-04 MED ORDER — LOSARTAN POTASSIUM 100 MG PO TABS
100.00 | ORAL_TABLET | ORAL | Status: DC
Start: 2020-04-04 — End: 2020-04-04

## 2020-04-04 MED ORDER — ACCU-CHEK GUIDE VI STRP: ORAL_STRIP | 3 refills | Status: DC

## 2020-04-04 MED ORDER — FLUTICASONE PROPIONATE 50 MCG/ACT NA SUSP
1.00 | NASAL | Status: DC
Start: 2020-04-04 — End: 2020-04-04

## 2020-04-04 MED ORDER — ALBUTEROL SULFATE HFA 108 (90 BASE) MCG/ACT IN AERS
2.00 | INHALATION_SPRAY | RESPIRATORY_TRACT | Status: DC
Start: ? — End: 2020-04-04

## 2020-04-04 MED ORDER — ZINC SULFATE 220 (50 ZN) MG PO CAPS
220.00 | ORAL_CAPSULE | ORAL | Status: DC
Start: 2020-04-04 — End: 2020-04-04

## 2020-04-08 ENCOUNTER — Other Ambulatory Visit: Payer: Self-pay | Admitting: Family Medicine

## 2020-04-08 MED ORDER — ACCU-CHEK SOFTCLIX LANCETS MISC: 2 refills | Status: AC

## 2020-04-18 ENCOUNTER — Telehealth: Payer: Self-pay | Admitting: Internal Medicine

## 2020-04-18 NOTE — Telephone Encounter (Signed)
Spoke with patient and reviewed that there are several wonderful providers in that area. Provided her with name and number of one that I know in that area and encouraged her to reach out to the hospital in that area and they can also provide recommendations. She was appreciative for the information with no further questions at this time.

## 2020-04-18 NOTE — Telephone Encounter (Signed)
Patient would like a recommendation for a cardiologist in [place]. States she has moved.

## 2020-05-17 ENCOUNTER — Other Ambulatory Visit: Payer: Self-pay | Admitting: Internal Medicine

## 2020-07-02 ENCOUNTER — Other Ambulatory Visit: Payer: Self-pay | Admitting: *Deleted

## 2020-07-02 NOTE — Patient Outreach (Signed)
[place] [facility]) Care Management  07/02/2020  Yolanda Gallegos 1961/06/29 [id number]  Unsuccessful outreach attempt made to patient. RN Health Coach left HIPAA compliant voicemail message along with her contact information.  Plan: RN Health Coach will call patient within the next several business days and will send patient an unsuccessful letter.  Emelia Loron RN, BSN [place] [phone] [email or web address]

## 2020-07-10 ENCOUNTER — Telehealth: Payer: Self-pay | Admitting: Family Medicine

## 2020-07-10 ENCOUNTER — Other Ambulatory Visit: Payer: Self-pay | Admitting: *Deleted

## 2020-07-10 NOTE — Patient Outreach (Signed)
[place] [facility]) Care Management  07/10/2020  TOULA MIYASAKI 07-Jul-1961 [id number]  Unsuccessful outreach attempt made to patient. RN Health Coach left HIPAA compliant voicemail message along with her contact information.  Plan: RN Health Coach will call patient within the next several business days.   Emelia Loron RN, BSN [place] [phone] [email or web address]

## 2020-07-10 NOTE — Progress Notes (Signed)
  Chronic Care Management   Outreach Note  07/10/2020 Name: MYRL BYNUM MRN: [id number] DOB: [birth year]  Referred by: [name], MD Reason for referral : No chief complaint on file.   An unsuccessful telephone outreach was attempted today. The patient was referred to the pharmacist for assistance with care management and care coordination.   Follow Up Plan:   Carley Perdue [employer] Scheduler

## 2020-07-14 ENCOUNTER — Other Ambulatory Visit: Payer: Self-pay | Admitting: *Deleted

## 2020-07-14 NOTE — Patient Outreach (Signed)
[facility]) Care Management  07/14/2020  Yolanda Gallegos 11-01-1961 [id number]  Patient reached out to nurse via patient schedule messaging to inquire about the scheduled appointment nurse made to introduce the [facility] program to the patient. Patient explained that she has moved to [place], [place] and that her insurance no longer covers this region.   Plan: RN Health Coach will close case.  Emelia Loron RN, BSN [place] [phone] [email or web address]

## 2020-07-24 ENCOUNTER — Telehealth: Payer: Self-pay | Admitting: Family Medicine

## 2020-07-24 NOTE — Progress Notes (Signed)
  Chronic Care Management   Outreach Note  07/24/2020 Name: MYREL RAPPLEYE MRN: [id number] DOB: 1961-03-08  Referred by: [name], MD Reason for referral : No chief complaint on file.   A second unsuccessful telephone outreach was attempted today. The patient was referred to pharmacist for assistance with care management and care coordination.  Follow Up Plan:   Carley Perdue [employer] Scheduler

## 2020-07-31 ENCOUNTER — Telehealth: Payer: Self-pay | Admitting: Family Medicine

## 2020-07-31 NOTE — Progress Notes (Signed)
  Chronic Care Management   Outreach Note  07/31/2020 Name: Yolanda Gallegos MRN: [id number] DOB: [birth year]  Referred by: [name], MD Reason for referral : No chief complaint on file.   Third unsuccessful telephone outreach was attempted today. The patient was referred to the pharmacist for assistance with care management and care coordination.   Follow Up Plan:   Carley Perdue [employer] Scheduler

## 2020-10-01 ENCOUNTER — Other Ambulatory Visit: Payer: Self-pay | Admitting: Family Medicine
